# Patient Record
Sex: Female | Born: 1985 | Race: Black or African American | Hispanic: No | Marital: Married | Smoking: Former smoker
Health system: Southern US, Community
[De-identification: ages and names within clinical notes are randomized; demographics above are authoritative.]

## PROBLEM LIST (undated history)

## (undated) DIAGNOSIS — N840 Polyp of corpus uteri: Secondary | ICD-10-CM

## (undated) DIAGNOSIS — K469 Unspecified abdominal hernia without obstruction or gangrene: Secondary | ICD-10-CM

## (undated) DIAGNOSIS — I10 Essential (primary) hypertension: Secondary | ICD-10-CM

## (undated) DIAGNOSIS — K219 Gastro-esophageal reflux disease without esophagitis: Secondary | ICD-10-CM

## (undated) DIAGNOSIS — E7800 Pure hypercholesterolemia, unspecified: Secondary | ICD-10-CM

## (undated) DIAGNOSIS — E282 Polycystic ovarian syndrome: Secondary | ICD-10-CM

## (undated) DIAGNOSIS — E119 Type 2 diabetes mellitus without complications: Secondary | ICD-10-CM

## (undated) HISTORY — DX: Polyp of corpus uteri: N84.0

## (undated) HISTORY — DX: Unspecified abdominal hernia without obstruction or gangrene: K46.9

---

## 2011-11-24 HISTORY — PX: ESOPHAGOGASTRODUODENOSCOPY ENDOSCOPY: SHX5814

## 2015-07-12 ENCOUNTER — Encounter (HOSPITAL_COMMUNITY): Payer: Self-pay | Admitting: Emergency Medicine

## 2015-07-12 ENCOUNTER — Emergency Department (HOSPITAL_COMMUNITY)
Admission: EM | Admit: 2015-07-12 | Discharge: 2015-07-12 | Disposition: A | Discharge status: Home / Self Care | Payer: Self-pay | Attending: Emergency Medicine | Admitting: Emergency Medicine

## 2015-07-12 DIAGNOSIS — K0889 Other specified disorders of teeth and supporting structures: Secondary | ICD-10-CM

## 2015-07-12 DIAGNOSIS — Z8639 Personal history of other endocrine, nutritional and metabolic disease: Secondary | ICD-10-CM | POA: Insufficient documentation

## 2015-07-12 DIAGNOSIS — K088 Other specified disorders of teeth and supporting structures: Secondary | ICD-10-CM | POA: Insufficient documentation

## 2015-07-12 DIAGNOSIS — Z87891 Personal history of nicotine dependence: Secondary | ICD-10-CM | POA: Insufficient documentation

## 2015-07-12 DIAGNOSIS — I10 Essential (primary) hypertension: Secondary | ICD-10-CM | POA: Insufficient documentation

## 2015-07-12 DIAGNOSIS — K029 Dental caries, unspecified: Secondary | ICD-10-CM | POA: Insufficient documentation

## 2015-07-12 HISTORY — DX: Polycystic ovarian syndrome: E28.2

## 2015-07-12 HISTORY — DX: Pure hypercholesterolemia, unspecified: E78.00

## 2015-07-12 HISTORY — DX: Essential (primary) hypertension: I10

## 2015-07-12 HISTORY — DX: Gastro-esophageal reflux disease without esophagitis: K21.9

## 2015-07-12 MED ORDER — AMOXICILLIN 500 MG PO CAPS: 500.0000 mg | ORAL_CAPSULE | Freq: Three times a day (TID) | ORAL | Status: AC

## 2015-07-12 MED ORDER — TRAMADOL HCL 50 MG PO TABS: 50.0000 mg | ORAL_TABLET | Freq: Four times a day (QID) | ORAL | Status: DC | PRN

## 2015-07-12 NOTE — ED Provider Notes (Signed)
CSN: [id number]     Arrival date & time 07/12/15  1445 History   First MD Initiated Contact with Patient 07/12/15 1605     Chief Complaint  Patient presents with  . Dental Pain     (Consider location/radiation/quality/duration/timing/severity/associated sxs/prior Treatment) The history is provided by the patient.   Jessica Greene is a 29 y.o. female presenting with a 2 week history of dental pain with known dental cavities .   The patient has a history of injury and/or decay in the tooth involved which has recently started to cause increased  Pain. She was scheduled to have a root canal by her dentist in [place], but recently moved to this area.  There has been no fevers, chills, nausea or vomiting, also no complaint of difficulty swallowing, although chewing makes pain worse.  The patient has tried aleve without relief of symptoms.        Past Medical History  Diagnosis Date  . Hypertension   . Polycystic ovarian syndrome   . High cholesterol   . GERD (gastroesophageal reflux disease)    History reviewed. No pertinent past surgical history. History reviewed. No pertinent family history. Social History  Substance Use Topics  . Smoking status: Former Games developer  . Smokeless tobacco: None  . Alcohol Use: Yes     Comment: occasionally   OB History    No data available     Review of Systems  Constitutional: Negative for fever.  HENT: Positive for dental problem. Negative for facial swelling and sore throat.   Respiratory: Negative for shortness of breath.   Musculoskeletal: Negative for neck pain and neck stiffness.      Allergies  Review of patient's allergies indicates no known allergies.  Home Medications   Prior to Admission medications   Medication Sig Start Date End Date Taking? Authorizing Provider  naproxen sodium (ALEVE) 220 MG tablet Take 220-440 mg by mouth daily as needed (for pain).   Yes Historical Provider, MD  amoxicillin (AMOXIL) 500 MG capsule Take 1  capsule (500 mg total) by mouth 3 (three) times daily. 07/12/15 07/22/15  Burgess Amor, PA-C  traMADol (ULTRAM) 50 MG tablet Take 1 tablet (50 mg total) by mouth every 6 (six) hours as needed. 07/12/15   Burgess Amor, PA-C   BP 143/92 mmHg  Pulse 82  Temp(Src) 98.4 F (36.9 C) (Oral)  Resp 16  Ht  (1.702 m)  Wt 248 lb (112.492 kg)  BMI 38.83 kg/m2  SpO2 100% Physical Exam  Constitutional: She is oriented to person, place, and time. She appears well-developed and well-nourished. No distress.  HENT:  Head: Normocephalic and atraumatic.  Right Ear: Tympanic membrane, external ear and ear canal normal.  Left Ear: Tympanic membrane, external ear and ear canal normal.  Mouth/Throat: Oropharynx is clear and moist and mucous membranes are normal. No oral lesions. No trismus in the jaw. Dental caries present. No dental abscesses.    Sublingual space is soft.  Eyes: Conjunctivae are normal.  Neck: Normal range of motion. Neck supple.  Cardiovascular: Normal rate and normal heart sounds.   Pulmonary/Chest: Effort normal.  Abdominal: She exhibits no distension.  Musculoskeletal: Normal range of motion.  Lymphadenopathy:    She has no cervical adenopathy.  Neurological: She is alert and oriented to person, place, and time.  Skin: Skin is warm and dry. No erythema.  Psychiatric: She has a normal mood and affect.    ED Course  Procedures (including critical care time) Labs Review Labs  Reviewed - No data to display  Imaging Review No results found. I have personally reviewed and evaluated these images and lab results as part of my medical decision-making.   EKG Interpretation None      MDM   Final diagnoses:  Dental cavities  Pain, dental   Pt placed on amoxil, tramadol.  Dental referral numbers given.    Burgess Amor, PA-C 07/12/15 1627  Bethann Berkshire, MD 07/12/15 2325

## 2015-07-12 NOTE — ED Notes (Signed)
Patient complaining of right upper dental pain x 2 weeks worsening over the last two days. States pain radiates into right ear and head.

## 2015-07-12 NOTE — Discharge Instructions (Signed)
Dental Caries °Dental caries (also called tooth decay) is the most common oral disease. It can occur at any age but is more common in children and young adults.  °HOW DENTAL CARIES DEVELOPS  °The process of decay begins when bacteria and foods (particularly sugars and starches) combine in your mouth to produce plaque. Plaque is a substance that sticks to the hard, outer surface of a tooth (enamel). The bacteria in plaque produce acids that attack enamel. These acids may also attack the root surface of a tooth (cementum) if it is exposed. Repeated attacks dissolve these surfaces and create holes in the tooth (cavities). If left untreated, the acids destroy the other layers of the tooth.  °RISK FACTORS °· Frequent sipping of sugary beverages.   °· Frequent snacking on sugary and starchy foods, especially those that easily get stuck in the teeth.   °· Poor oral hygiene.   °· Dry mouth.   °· Substance abuse such as methamphetamine abuse.   °· Broken or poor-fitting dental restorations.   °· Eating disorders.   °· Gastroesophageal reflux disease (GERD).   °· Certain radiation treatments to the head and neck. °SYMPTOMS °In the early stages of dental caries, symptoms are seldom present. Sometimes white, chalky areas may be seen on the enamel or other tooth layers. In later stages, symptoms may include: °· Pits and holes on the enamel. °· Toothache after sweet, hot, or cold foods or drinks are consumed. °· Pain around the tooth. °· Swelling around the tooth. °DIAGNOSIS  °Most of the time, dental caries is detected during a regular dental checkup. A diagnosis is made after a thorough medical and dental history is taken and the surfaces of your teeth are checked for signs of dental caries. Sometimes special instruments, such as lasers, are used to check for dental caries. Dental X-ray exams may be taken so that areas not visible to the eye (such as between the contact areas of the teeth) can be checked for cavities.    °TREATMENT  °If dental caries is in its early stages, it may be reversed with a fluoride treatment or an application of a remineralizing agent at the dental office. Thorough brushing and flossing at home is needed to aid these treatments. If it is in its later stages, treatment depends on the location and extent of tooth destruction:  °· If a small area of the tooth has been destroyed, the destroyed area will be removed and cavities will be filled with a material such as gold, silver amalgam, or composite resin.   °· If a large area of the tooth has been destroyed, the destroyed area will be removed and a cap (crown) will be fitted over the remaining tooth structure.   °· If the center part of the tooth (pulp) is affected, a procedure called a root canal will be needed before a filling or crown can be placed.   °· If most of the tooth has been destroyed, the tooth may need to be pulled (extracted). °HOME CARE INSTRUCTIONS °You can prevent, stop, or reverse dental caries at home by practicing good oral hygiene. Good oral hygiene includes: °· Thoroughly cleaning your teeth at least twice a day with a toothbrush and dental floss.   °· Using a fluoride toothpaste. A fluoride mouth rinse may also be used if recommended by your dentist or health care provider.   °· Restricting the amount of sugary and starchy foods and sugary liquids you consume.   °· Avoiding frequent snacking on these foods and sipping of these liquids.   °· Keeping regular visits with   a dentist for checkups and cleanings. PREVENTION   Practice good oral hygiene.  Consider a dental sealant. A dental sealant is a coating material that is applied by your dentist to the pits and grooves of teeth. The sealant prevents food from being trapped in them. It may protect the teeth for several years.  Ask about fluoride supplements if you live in a community without fluorinated water or with water that has a low fluoride content. Use fluoride supplements  as directed by your dentist or health care provider.  Allow fluoride varnish applications to teeth if directed by your dentist or health care provider. Document Released: 08/01/2002 Document Revised: 03/26/2014 Document Reviewed: 11/11/2012 [facility] Patient Information 2015 [place], [place]. This information is not intended to replace advice given to you by your health care provider. Make sure you discuss any questions you have with your health care provider.   As discussed,  I believe you do have an early dental infection which is making your pain worse.  Use the medicines as prescribed, taking the entire course of the antibiotic.  You may take the tramadol prescribed for pain relief.  This will make you drowsy - do not drive within 4 hours of taking this medication.

## 2015-11-24 DIAGNOSIS — N840 Polyp of corpus uteri: Secondary | ICD-10-CM

## 2015-11-24 HISTORY — DX: Polyp of corpus uteri: N84.0

## 2016-06-29 HISTORY — PX: POLYPECTOMY: SHX149

## 2017-10-10 ENCOUNTER — Emergency Department (HOSPITAL_COMMUNITY)
Admission: EM | Admit: 2017-10-10 | Discharge: 2017-10-10 | Disposition: A | Discharge status: Home / Self Care | Payer: Self-pay | Attending: Emergency Medicine | Admitting: Emergency Medicine

## 2017-10-10 ENCOUNTER — Emergency Department (HOSPITAL_BASED_OUTPATIENT_CLINIC_OR_DEPARTMENT_OTHER): Admit: 2017-10-10 | Discharge: 2017-10-10 | Disposition: A | Discharge status: Home / Self Care | Payer: Self-pay

## 2017-10-10 ENCOUNTER — Encounter (HOSPITAL_COMMUNITY): Payer: Self-pay

## 2017-10-10 DIAGNOSIS — R609 Edema, unspecified: Secondary | ICD-10-CM

## 2017-10-10 DIAGNOSIS — Z87891 Personal history of nicotine dependence: Secondary | ICD-10-CM | POA: Insufficient documentation

## 2017-10-10 DIAGNOSIS — I10 Essential (primary) hypertension: Secondary | ICD-10-CM | POA: Insufficient documentation

## 2017-10-10 DIAGNOSIS — M5432 Sciatica, left side: Secondary | ICD-10-CM | POA: Insufficient documentation

## 2017-10-10 DIAGNOSIS — Z79899 Other long term (current) drug therapy: Secondary | ICD-10-CM | POA: Insufficient documentation

## 2017-10-10 DIAGNOSIS — E7800 Pure hypercholesterolemia, unspecified: Secondary | ICD-10-CM | POA: Insufficient documentation

## 2017-10-10 LAB — CBC WITH DIFFERENTIAL/PLATELET
Basophils Absolute: 0 10*3/uL (ref 0.0–0.1)
Basophils Relative: 0 %
Eosinophils Absolute: 0.2 10*3/uL (ref 0.0–0.7)
Eosinophils Relative: 3 %
HCT: 37.8 % (ref 36.0–46.0)
Hemoglobin: 12.3 g/dL (ref 12.0–15.0)
Lymphocytes Relative: 56 %
Lymphs Abs: 4 10*3/uL (ref 0.7–4.0)
MCH: 30.3 pg (ref 26.0–34.0)
MCHC: 32.5 g/dL (ref 30.0–36.0)
MCV: 93.1 fL (ref 78.0–100.0)
Monocytes Absolute: 0.4 10*3/uL (ref 0.1–1.0)
Monocytes Relative: 6 %
Neutro Abs: 2.5 10*3/uL (ref 1.7–7.7)
Neutrophils Relative %: 35 %
Platelets: 216 10*3/uL (ref 150–400)
RBC: 4.06 MIL/uL (ref 3.87–5.11)
RDW: 12.7 % (ref 11.5–15.5)
WBC: 7.1 10*3/uL (ref 4.0–10.5)

## 2017-10-10 LAB — BASIC METABOLIC PANEL
Anion gap: 7 (ref 5–15)
BUN: 7 mg/dL (ref 6–20)
CO2: 26 mmol/L (ref 22–32)
Calcium: 9.4 mg/dL (ref 8.9–10.3)
Chloride: 104 mmol/L (ref 101–111)
Creatinine, Ser: 0.61 mg/dL (ref 0.44–1.00)
GFR calc Af Amer: >60 mL/min (ref >60)
GFR calc non Af Amer: >60 mL/min (ref >60)
Glucose, Bld: 94 mg/dL (ref 65–99)
Potassium: 3.7 mmol/L (ref 3.5–5.1)
Sodium: 137 mmol/L (ref 135–145)

## 2017-10-10 LAB — I-STAT BETA HCG BLOOD, ED (MC, WL, AP ONLY): I-stat hCG, quantitative: <5 m[IU]/mL (ref <5)

## 2017-10-10 MED ORDER — PREDNISONE 10 MG (21) PO TBPK: ORAL_TABLET | Freq: Every day | ORAL | 0 refills | Status: DC

## 2017-10-10 MED ORDER — MELOXICAM 15 MG PO TABS: 15.0000 mg | ORAL_TABLET | Freq: Every day | ORAL | 0 refills | Status: DC

## 2017-10-10 NOTE — ED Notes (Signed)
Abigail, PA at bedside at this time.  

## 2017-10-10 NOTE — Progress Notes (Signed)
VASCULAR LAB PRELIMINARY  PRELIMINARY  PRELIMINARY  PRELIMINARY  Left lower extremity venous duplex completed.    Preliminary report:  There is no DVT or SVT noted in the left lower extremity.  Called report to FreerAbigail Harris, PA  [facility] Center[facility]NA[facility], [facility]ANDACE, RVT 10/10/2017, 5:21 PM

## 2017-10-10 NOTE — Discharge Instructions (Signed)
Your labs and your ultrasound were negative for any abnormalities today.  He will need to follow-up with a primary care physician for further management of your hypertension.  This is likely secondary to sciatica which is usually worsened with long periods of sitting and improves when lying flat.  We are going to treat this with a steroid taper and some anti-inflammatory medications. SEEK IMMEDIATE MEDICAL ATTENTION IF: New numbness, tingling, weakness, or problem with the use of your arms or legs.  Severe back pain not relieved with medications.  Change in bowel or bladder control.  Increasing pain in any areas of the body (such as chest or abdominal pain).  Shortness of breath, dizziness or fainting.  Nausea (feeling sick to your stomach), vomiting, fever, or sweats.

## 2017-10-10 NOTE — ED Triage Notes (Signed)
Per Pt, Pt states that she is coming from home with complaints of left leg swelling and discomfort for the last two weeks. Reports a tingling feeling like she isn't having good blood flow.

## 2017-10-10 NOTE — ED Provider Notes (Signed)
[facility] Provider Note   CSN: [id number] Arrival date & time: 10/10/17  1316     History   Chief Complaint Chief Complaint  Patient presents with  . Leg Swelling    HPI Jessica Greene is a 31 y.o. female.  Who presents with chief complaint of left leg swelling.  She has a past medical history of hypertension.  She does not take medications for.  She also has a previous history of sciatica.  Patient states for the past 2 weeks she has had what feels like swelling in her left lower extremity and thinks she might have a circulation issue because she gets some numbness and tingling in the calf and foot.  This is worsened with sitting and seems to be relieved when lying flat.  Denies chest pain, shortness of breath.  The patient has not smoked for the past 9 years.  She denies a history of blood clots personally however does have a family history of such.  She denies back pain, urinary symptoms abdominal pain, fevers or chills.  HPI  Past Medical History:  Diagnosis Date  . GERD (gastroesophageal reflux disease)   . High cholesterol   . Hypertension   . Polycystic ovarian syndrome     There are no active problems to display for this patient.   Past Surgical History:  Procedure Laterality Date  . POLYPECTOMY      OB History    No data available       Home Medications    Prior to Admission medications   Medication Sig Start Date End Date Taking? Authorizing Provider  meloxicam (MOBIC) 15 MG tablet Take 1 tablet (15 mg total) daily by mouth. 10/10/17   Montel Vanderhoof, PA-C  predniSONE (STERAPRED UNI-PAK 21 TAB) 10 MG (21) TBPK tablet Take daily by mouth. Take 6 tabs by mouth daily  for 2 days, then 5 tabs for 2 days, then 4 tabs for 2 days, then 3 tabs for 2 days, 2 tabs for 2 days, then 1 tab by mouth daily for 2 days 10/10/17   Arthor CaptainHarris, Loriene Taunton, PA-C    Family History No family history on file.  Social History Social  History   Tobacco Use  . Smoking status: Former Smoker  Substance Use Topics  . Alcohol use: Yes    Comment: occasionally  . Drug use: Yes    Types: Marijuana     Allergies   Patient has no known allergies.   Review of Systems Review of Systems Ten systems reviewed and are negative for acute change, except as noted in the HPI.    Physical Exam Updated Vital Signs BP (!) 133/91   Pulse 70   Temp 98.4 F (36.9 C) (Oral)   Resp 18   Ht 5\' 7"  (1.702 m)   Wt 115.2 kg (254 lb)   SpO2 100%   BMI 39.78 kg/m   Physical Exam  Physical Exam  Nursing note and vitals reviewed. Constitutional: She is oriented to person, place, and time. She appears well-developed and well-nourished. No distress.  HENT:  Head: Normocephalic and atraumatic.  Eyes: Conjunctivae normal and EOM are normal. Pupils are equal, round, and reactive to light. No scleral icterus.  Neck: Normal range of motion.  Cardiovascular: Normal rate, regular rhythm and normal heart sounds.  Exam reveals no gallop and no friction rub.   Bilateral DP and PT pulses 2+.  No discernible swelling in the left leg.   No murmur  heard. Pulmonary/Chest: Effort normal and breath sounds normal. No respiratory distress.  Abdominal: Soft. Bowel sounds are normal. She exhibits no distension and no mass. There is no tenderness. There is no guarding.  Musculoskeletal: Positive straight leg test on the left.Negative Homans sign, soft compartments bilaterally Neurological: She is alert and oriented to person, place, and time.  Skin: Skin is warm and dry. She is not diaphoretic.    ED Treatments / Results  Labs (all labs ordered are listed, but only abnormal results are displayed) Labs Reviewed  BASIC METABOLIC PANEL  CBC WITH DIFFERENTIAL/PLATELET  I-STAT BETA HCG BLOOD, ED ([facility], [facility], [facility] ONLY)    EKG  EKG Interpretation None       Radiology No results found.  Procedures Procedures (including critical care  time)  Medications Ordered in ED Medications - No data to display   Initial Impression / Assessment and Plan / ED Course  I have reviewed the triage vital signs and the nursing notes.  Pertinent labs & imaging results that were available during my care of the patient were reviewed by me and considered in my medical decision making (see chart for details).  Clinical Course as of Oct 10 1914  Wynelle LinkSun Oct 10, 2017  [id number] Patient ultrasound negative for DVT, her lab values are unremarkable.  [AH]    Clinical Course User Index [AH] Arthor CaptainHarris, Kassadie Pancake, PA-C   Patient with left-sided paresthesia.  No other neurologic complaints, no discernible swelling however exam is limited by the patient's body habitus.  Will obtain basic lab work and evaluate with ultrasound to rule out clot in the left lower extremity.  Final Clinical Impressions(s) / ED Diagnoses   Final diagnoses:  Sciatica, left side  Hypertension, unspecified type    ED Discharge Orders        Ordered    meloxicam (MOBIC) 15 MG tablet  Daily     10/10/17 1759    predniSONE (STERAPRED UNI-PAK 21 TAB) 10 MG (21) TBPK tablet  Daily     10/10/17 1759       Arthor CaptainHarris, Aniyha Tate, PA-C 10/10/17 1916    Arby BarrettePfeiffer, Marcy, MD 10/10/17 2017

## 2018-02-08 ENCOUNTER — Encounter (HOSPITAL_COMMUNITY): Payer: Self-pay | Admitting: *Deleted

## 2018-02-08 ENCOUNTER — Other Ambulatory Visit: Payer: Self-pay

## 2018-02-08 ENCOUNTER — Emergency Department (HOSPITAL_COMMUNITY)
Admission: EM | Admit: 2018-02-08 | Discharge: 2018-02-08 | Disposition: A | Discharge status: Home / Self Care | Payer: Self-pay | Attending: Emergency Medicine | Admitting: Emergency Medicine

## 2018-02-08 DIAGNOSIS — Z79899 Other long term (current) drug therapy: Secondary | ICD-10-CM | POA: Insufficient documentation

## 2018-02-08 DIAGNOSIS — I10 Essential (primary) hypertension: Secondary | ICD-10-CM | POA: Insufficient documentation

## 2018-02-08 DIAGNOSIS — R51 Headache: Secondary | ICD-10-CM | POA: Insufficient documentation

## 2018-02-08 DIAGNOSIS — Z87891 Personal history of nicotine dependence: Secondary | ICD-10-CM | POA: Insufficient documentation

## 2018-02-08 DIAGNOSIS — R519 Headache, unspecified: Secondary | ICD-10-CM

## 2018-02-08 LAB — I-STAT CHEM 8, ED
BUN: 4 mg/dL — ABNORMAL LOW (ref 6–20)
Calcium, Ion: 1.21 mmol/L (ref 1.15–1.40)
Chloride: 102 mmol/L (ref 101–111)
Creatinine, Ser: 0.6 mg/dL (ref 0.44–1.00)
Glucose, Bld: 96 mg/dL (ref 65–99)
HCT: 38 % (ref 36.0–46.0)
Hemoglobin: 12.9 g/dL (ref 12.0–15.0)
Potassium: 3.6 mmol/L (ref 3.5–5.1)
Sodium: 140 mmol/L (ref 135–145)
TCO2: 27 mmol/L (ref 22–32)

## 2018-02-08 MED ORDER — METOCLOPRAMIDE HCL 10 MG PO TABS
5.0000 mg | ORAL_TABLET | Freq: Once | ORAL | Status: AC
  Administered 2018-02-08: 5 mg via ORAL
  Filled 2018-02-08: qty 1

## 2018-02-08 MED ORDER — HYDROCHLOROTHIAZIDE 12.5 MG PO CAPS: 12.5000 mg | ORAL_CAPSULE | Freq: Every day | ORAL | 0 refills | Status: DC

## 2018-02-08 MED ORDER — KETOROLAC TROMETHAMINE 30 MG/ML IJ SOLN
30.0000 mg | Freq: Once | INTRAMUSCULAR | Status: AC
  Administered 2018-02-08: 30 mg via INTRAMUSCULAR
  Filled 2018-02-08: qty 1

## 2018-02-08 MED ORDER — IBUPROFEN 400 MG PO TABS
400.0000 mg | ORAL_TABLET | Freq: Once | ORAL | Status: AC | PRN
  Administered 2018-02-08: 400 mg via ORAL
  Filled 2018-02-08: qty 1

## 2018-02-08 NOTE — Discharge Instructions (Signed)
Please read attached information. If you experience any new or worsening signs or symptoms please return to the emergency room for evaluation. Please follow-up with your primary care provider or specialist as discussed. Please use medication prescribed only as directed and discontinue taking if you have any concerning signs or symptoms.   °

## 2018-02-08 NOTE — ED Provider Notes (Signed)
[facility] EMERGENCY DEPARTMENT Provider Note   CSN: [id number] Arrival date & time: 02/08/18  1715   History   Chief Complaint Chief Complaint  Patient presents with  . Headache    HPI Jessica Greene is a 32 y.o. female.    HPI   32 year old female presents today with complaints of headache.  Patient reports a significant past medical history of elevated blood pressure.  She notes that on numerous occasions she is noticed her blood pressure in the 160 range.  She was given blood pressure medication while in the hospital at one point but was not prescribed any.  She notes that last week her blood pressure was in the 160 systolic range.  She notes today she had slight blurred vision while watching the computer screen at work and generalized headache.  She denies any acute neurological deficits associated with this.  She took her blood pressure at work and noticed it was elevated.  Patient denies any acute chest pain here, abdominal pain, or any other signs of endorgan damage.  Patient was given ibuprofen in the waiting room which improved her headache.  She notes after rechecking her blood pressure was down to  153/93.      Past Medical History:  Diagnosis Date  . GERD (gastroesophageal reflux disease)   . High cholesterol   . Hypertension   . Polycystic ovarian syndrome     There are no active problems to display for this patient.   Past Surgical History:  Procedure Laterality Date  . POLYPECTOMY      OB History    No data available       Home Medications    Prior to Admission medications   Medication Sig Start Date End Date Taking? Authorizing Provider  hydrochlorothiazide (MICROZIDE) 12.5 MG capsule Take 1 capsule (12.5 mg total) by mouth daily. 02/08/18   Sarena Jezek, Tinnie Gens, PA-C  meloxicam (MOBIC) 15 MG tablet Take 1 tablet (15 mg total) daily by mouth. 10/10/17   Harris, Abigail, PA-C  predniSONE (STERAPRED UNI-PAK 21 TAB) 10 MG (21)  TBPK tablet Take daily by mouth. Take 6 tabs by mouth daily  for 2 days, then 5 tabs for 2 days, then 4 tabs for 2 days, then 3 tabs for 2 days, 2 tabs for 2 days, then 1 tab by mouth daily for 2 days 10/10/17   Arthor Captain, PA-C    Family History History reviewed. No pertinent family history.  Social History Social History   Tobacco Use  . Smoking status: Former Smoker  Substance Use Topics  . Alcohol use: Yes    Comment: occasionally  . Drug use: Yes    Types: Marijuana     Allergies   Patient has no known allergies.   Review of Systems Review of Systems  All other systems reviewed and are negative.    Physical Exam Updated Vital Signs BP (!) 153/93 (BP Location: Right Arm)   Pulse 88   Temp 98.2 F (36.8 C) (Oral)   Resp 18   SpO2 100%   Physical Exam  Constitutional: She is oriented to person, place, and time. She appears well-developed and well-nourished.  HENT:  Head: Normocephalic and atraumatic.  Eyes: Conjunctivae are normal. Pupils are equal, round, and reactive to light. Right eye exhibits no discharge. Left eye exhibits no discharge. No scleral icterus.  Neck: Normal range of motion. No JVD present. No tracheal deviation present.  Cardiovascular: Normal rate, regular rhythm, normal heart sounds and intact  distal pulses.  Pulmonary/Chest: Effort normal. No stridor.  Neurological: She is alert and oriented to person, place, and time. She has normal strength. She displays normal reflexes. No cranial nerve deficit or sensory deficit. She exhibits normal muscle tone. Coordination normal. GCS eye subscore is 4. GCS verbal subscore is 5. GCS motor subscore is 6.  Psychiatric: She has a normal mood and affect. Her behavior is normal. Judgment and thought content normal.  Nursing note and vitals reviewed.   ED Treatments / Results  Labs (all labs ordered are listed, but only abnormal results are displayed) Labs Reviewed  I-STAT CHEM 8, ED - Abnormal;  Notable for the following components:      Result Value   BUN 4 (*)    All other components within normal limits    EKG  EKG Interpretation None       Radiology No results found.  Procedures Procedures (including critical care time)  Medications Ordered in ED Medications  ibuprofen (ADVIL,MOTRIN) tablet 400 mg (400 mg Oral Given 02/08/18 1802)  ketorolac (TORADOL) 30 MG/ML injection 30 mg (30 mg Intramuscular Given 02/08/18 2043)  metoCLOPramide (REGLAN) tablet 5 mg (5 mg Oral Given 02/08/18 2042)     Initial Impression / Assessment and Plan / ED Course  I have reviewed the triage vital signs and the nursing notes.  Pertinent labs & imaging results that were available during my care of the patient were reviewed by me and considered in my medical decision making (see chart for details).     Final Clinical Impressions(s) / ED Diagnoses   Final diagnoses:  Hypertension, unspecified type  Acute nonintractable headache, unspecified headache type   Labs: I- stat chem   Imaging:  Consults:  Therapeutics: toradol, reglan  Discharge Meds: hctz  Assessment/Plan:    32 year old female presents today with hypertension and headache.  Patient does have a past medical history of hypertension but has not been started on medications.  Chem-8 reassuring with no change in kidney function no elevation in potassium or hypokalemia.  Patient started on HCTZ encouraged follow-up with primary care for reevaluation ongoing management of hypertension.  She has no signs of endorgan damage here today.  Blood pressure 153/93 with no neurological deficits.  Discharged with strict return precautions and follow-up information.  Headache resolved with above medications.  Patient verbalized understanding and agreement to today's plan.  ED Discharge Orders        Ordered    hydrochlorothiazide (MICROZIDE) 12.5 MG capsule  Daily     02/08/18 2128       Eyvonne MechanicHedges, Sehaj Mcenroe, PA-C 02/08/18 2131      Gerhard MunchLockwood, Robert, MD 02/08/18 2208

## 2018-02-08 NOTE — ED Triage Notes (Signed)
Pt reports severe headache since she woke up this am. Denies n/v. No hx of migraines. Has not taken any otc meds pta.

## 2018-02-08 NOTE — ED Notes (Signed)
ED Provider at bedside. 

## 2018-02-23 ENCOUNTER — Encounter: Payer: Self-pay | Admitting: Nurse Practitioner

## 2018-02-23 ENCOUNTER — Ambulatory Visit (INDEPENDENT_AMBULATORY_CARE_PROVIDER_SITE_OTHER): Payer: PRIVATE HEALTH INSURANCE | Admitting: Nurse Practitioner

## 2018-02-23 VITALS — BP 132/92 | HR 76 | Temp 98.1°F | Ht 67.0 in | Wt 254.4 lb

## 2018-02-23 DIAGNOSIS — R519 Headache, unspecified: Secondary | ICD-10-CM

## 2018-02-23 DIAGNOSIS — I10 Essential (primary) hypertension: Secondary | ICD-10-CM

## 2018-02-23 DIAGNOSIS — G8929 Other chronic pain: Secondary | ICD-10-CM

## 2018-02-23 DIAGNOSIS — R51 Headache: Secondary | ICD-10-CM

## 2018-02-23 DIAGNOSIS — K219 Gastro-esophageal reflux disease without esophagitis: Secondary | ICD-10-CM

## 2018-02-23 MED ORDER — NAPROXEN 500 MG PO TABS: 500.0000 mg | ORAL_TABLET | Freq: Two times a day (BID) | ORAL | 0 refills | Status: DC | PRN

## 2018-02-23 NOTE — Patient Instructions (Addendum)
Please sign medical release to get records from previous GYN.  I will provide recommendations with regard to PCOS and heavy menstrual cycle after review of records from GYN.  Continue HCTZ for HTN. Maintain DASH diet and regular exercise.  For GERD you may use Pepcid AC or omeprazole as directed on package. These are OTC medications  Make appt with ophthalmology for eye exam.  DASH Eating Plan DASH stands for "Dietary Approaches to Stop Hypertension." The DASH eating plan is a healthy eating plan that has been shown to reduce high blood pressure (hypertension). It may also reduce your risk for type 2 diabetes, heart disease, and stroke. The DASH eating plan may also help with weight loss. What are tips for following this plan? General guidelines  Avoid eating more than 2,300 mg (milligrams) of salt (sodium) a day. If you have hypertension, you may need to reduce your sodium intake to 1,500 mg a day.  Limit alcohol intake to no more than 1 drink a day for nonpregnant women and 2 drinks a day for men. One drink equals 12 oz of beer, 5 oz of wine, or 1 oz of hard liquor.  Work with your health care provider to maintain a healthy body weight or to lose weight. Ask what an ideal weight is for you.  Get at least 30 minutes of exercise that causes your heart to beat faster (aerobic exercise) most days of the week. Activities may include walking, swimming, or biking.  Work with your health care provider or diet and nutrition specialist (dietitian) to adjust your eating plan to your individual calorie needs. Reading food labels  Check food labels for the amount of sodium per serving. Choose foods with less than 5 percent of the Daily Value of sodium. Generally, foods with less than 300 mg of sodium per serving fit into this eating plan.  To find whole grains, look for the word "whole" as the first word in the ingredient list. Shopping  Buy products labeled as "low-sodium" or "no salt  added."  Buy fresh foods. Avoid canned foods and premade or frozen meals. Cooking  Avoid adding salt when cooking. Use salt-free seasonings or herbs instead of table salt or sea salt. Check with your health care provider or pharmacist before using salt substitutes.  Do not fry foods. Cook foods using healthy methods such as baking, boiling, grilling, and broiling instead.  Cook with heart-healthy oils, such as olive, canola, soybean, or sunflower oil. Meal planning   Eat a balanced diet that includes: ? 5 or more servings of fruits and vegetables each day. At each meal, try to fill half of your plate with fruits and vegetables. ? Up to 6-8 servings of whole grains each day. ? Less than 6 oz of lean meat, poultry, or fish each day. A 3-oz serving of meat is about the same size as a deck of cards. One egg equals 1 oz. ? 2 servings of low-fat dairy each day. ? A serving of nuts, seeds, or beans 5 times each week. ? Heart-healthy fats. Healthy fats called Omega-3 fatty acids are found in foods such as flaxseeds and coldwater fish, like sardines, salmon, and mackerel.  Limit how much you eat of the following: ? Canned or prepackaged foods. ? Food that is high in trans fat, such as fried foods. ? Food that is high in saturated fat, such as fatty meat. ? Sweets, desserts, sugary drinks, and other foods with added sugar. ? Full-fat dairy products.  Do  not salt foods before eating.  Try to eat at least 2 vegetarian meals each week.  Eat more home-cooked food and less restaurant, buffet, and fast food.  When eating at a restaurant, ask that your food be prepared with less salt or no salt, if possible. What foods are recommended? The items listed may not be a complete list. Talk with your dietitian about what dietary choices are best for you. Grains Whole-grain or whole-wheat bread. Whole-grain or whole-wheat pasta. Brown rice. [place]. Bulgur. Whole-grain and low-sodium cereals.  Pita bread. Low-fat, low-sodium crackers. Whole-wheat flour tortillas. Vegetables Fresh or frozen vegetables (raw, steamed, roasted, or grilled). Low-sodium or reduced-sodium tomato and vegetable juice. Low-sodium or reduced-sodium tomato sauce and tomato paste. Low-sodium or reduced-sodium canned vegetables. Fruits All fresh, dried, or frozen fruit. Canned fruit in natural juice (without added sugar). Meat and other protein foods Skinless chicken or [place]. Ground chicken or [place]. Pork with fat trimmed off. Fish and seafood. Egg whites. Dried beans, peas, or lentils. Unsalted nuts, nut butters, and seeds. Unsalted canned beans. Lean cuts of beef with fat trimmed off. Low-sodium, lean deli meat. Dairy Low-fat (1%) or fat-free (skim) milk. Fat-free, low-fat, or reduced-fat cheeses. Nonfat, low-sodium ricotta or cottage cheese. Low-fat or nonfat yogurt. Low-fat, low-sodium cheese. Fats and oils Soft margarine without trans fats. Vegetable oil. Low-fat, reduced-fat, or light mayonnaise and salad dressings (reduced-sodium). Canola, safflower, olive, soybean, and sunflower oils. Avocado. Seasoning and other foods Herbs. Spices. Seasoning mixes without salt. Unsalted popcorn and pretzels. Fat-free sweets. What foods are not recommended? The items listed may not be a complete list. Talk with your dietitian about what dietary choices are best for you. Grains Baked goods made with fat, such as croissants, muffins, or some breads. Dry pasta or rice meal packs. Vegetables Creamed or fried vegetables. Vegetables in a cheese sauce. Regular canned vegetables (not low-sodium or reduced-sodium). Regular canned tomato sauce and paste (not low-sodium or reduced-sodium). Regular tomato and vegetable juice (not low-sodium or reduced-sodium). Angie Fava. Olives. Fruits Canned fruit in a light or heavy syrup. Fried fruit. Fruit in cream or butter sauce. Meat and other protein foods Fatty cuts of meat. Ribs. Fried  meat. Berniece Salines. Sausage. Bologna and other processed lunch meats. Salami. Fatback. Hotdogs. Bratwurst. Salted nuts and seeds. Canned beans with added salt. Canned or smoked fish. Whole eggs or egg yolks. Chicken or [place] with skin. Dairy Whole or 2% milk, cream, and half-and-half. Whole or full-fat cream cheese. Whole-fat or sweetened yogurt. Full-fat cheese. Nondairy creamers. Whipped toppings. Processed cheese and cheese spreads. Fats and oils Butter. Stick margarine. Lard. Shortening. Ghee. Bacon fat. Tropical oils, such as coconut, palm kernel, or palm oil. Seasoning and other foods Salted popcorn and pretzels. Onion salt, garlic salt, seasoned salt, table salt, and sea salt. Worcestershire sauce. Tartar sauce. Barbecue sauce. Teriyaki sauce. Soy sauce, including reduced-sodium. Steak sauce. Canned and packaged gravies. Fish sauce. Oyster sauce. Cocktail sauce. Horseradish that you find on the shelf. Ketchup. Mustard. Meat flavorings and tenderizers. Bouillon cubes. Hot sauce and Tabasco sauce. Premade or packaged marinades. Premade or packaged taco seasonings. Relishes. Regular salad dressings. Where to find more information:  National Heart, Lung, and [place]: https://wilson-eaton.com/  American Heart Association: www.heart.org Summary  The DASH eating plan is a healthy eating plan that has been shown to reduce high blood pressure (hypertension). It may also reduce your risk for type 2 diabetes, heart disease, and stroke.  With the DASH eating plan, you should limit salt (sodium) intake  to 2,300 mg a day. If you have hypertension, you may need to reduce your sodium intake to 1,500 mg a day.  When on the DASH eating plan, aim to eat more fresh fruits and vegetables, whole grains, lean proteins, low-fat dairy, and heart-healthy fats.  Work with your health care provider or diet and nutrition specialist (dietitian) to adjust your eating plan to your individual calorie needs. This information is  not intended to replace advice given to you by your health care provider. Make sure you discuss any questions you have with your health care provider. Document Released: 10/29/2011 Document Revised: 11/02/2016 Document Reviewed: 11/02/2016 Elsevier Interactive Patient Education  [employer]2018 Elsevier Inc.

## 2018-02-23 NOTE — Progress Notes (Signed)
Subjective:  Patient ID: Jessica Greene, female    DOB: 1985-12-16  Age: 32 y.o. MRN: [id number]  CC: Establish Care ( Est care.would like to discuss issues with bleeding (PCOS), recently in hospital for headache/ vision issues on 3.19.19  Tdap?)   HPI  Hx of PCOS: She is not interested in use of OCP or metformin at this time. Metformin 500mg  use in past, stopped due to diarrhea.  Hx of menorrhagia: Reports chronic intermittent vaginal bleed with clots and tissue. Had uterine polypectomy is past by previous GYN.  Hx of chronic Headache: Used of naproxen and ibuprofen as needed with significant improvement. Described as pressure. Onset 2011. Associated with blurry vision.  no eye exam in several years. Use of corrective lens.  Hx of GERD. Like like to know what to use to improve heartburn  Outpatient Medications Prior to Visit  Medication Sig Dispense Refill  . hydrochlorothiazide (MICROZIDE) 12.5 MG capsule Take 1 capsule (12.5 mg total) by mouth daily. 30 capsule 0  . meloxicam (MOBIC) 15 MG tablet Take 1 tablet (15 mg total) daily by mouth. (Patient not taking: Reported on 02/23/2018) 30 tablet 0  . predniSONE (STERAPRED UNI-PAK 21 TAB) 10 MG (21) TBPK tablet Take daily by mouth. Take 6 tabs by mouth daily  for 2 days, then 5 tabs for 2 days, then 4 tabs for 2 days, then 3 tabs for 2 days, 2 tabs for 2 days, then 1 tab by mouth daily for 2 days (Patient not taking: Reported on 02/23/2018) 42 tablet 0   No facility-administered medications prior to visit.    Social History   Socioeconomic History  . Marital status: Married    Spouse name: Not on file  . Number of children: Not on file  . Years of education: Not on file  . Highest education level: Not on file  Occupational History  . Not on file  Social Needs  . Financial resource strain: Not on file  . Food insecurity:    Worry: Not on file    Inability: Not on file  . Transportation needs:    Medical: Not on  file    Non-medical: Not on file  Tobacco Use  . Smoking status: Former Games developer  . Smokeless tobacco: Never Used  Substance and Sexual Activity  . Alcohol use: Yes    Comment: occasionally  . Drug use: Yes    Types: Marijuana  . Sexual activity: Not on file  Lifestyle  . Physical activity:    Days per week: Not on file    Minutes per session: Not on file  . Stress: Not on file  Relationships  . Social connections:    Talks on phone: Not on file    Gets together: Not on file    Attends religious service: Not on file    Active member of club or organization: Not on file    Attends meetings of clubs or organizations: Not on file    Relationship status: Not on file  . Intimate partner violence:    Fear of current or ex partner: Not on file    Emotionally abused: Not on file    Physically abused: Not on file    Forced sexual activity: Not on file  Other Topics Concern  . Not on file  Social History Narrative  . Not on file   Family History  Problem Relation Age of Onset  . Diabetes Mother   . Hypertension Mother   . Hyperlipidemia Mother   .  Heart disease Mother   . Hypertension Father   . Diabetes Father   . Hyperlipidemia Father   . Heart disease Father   . Stroke Father   . Cancer Father        pancreatic  . Heart disease Sister   . Depression Sister   . Bipolar disorder Sister   . Heart disease Maternal Aunt   . Heart disease Maternal Uncle   . Heart disease Paternal Aunt   . Heart disease Paternal Uncle   . Heart disease Maternal Grandmother   . Heart disease Maternal Grandfather   . Heart disease Paternal Grandmother   . Heart disease Paternal Grandfather   . Birth defects Paternal Grandfather     ROS Review of Systems  HENT: Negative for sinus pain.   Eyes: Positive for blurred vision. Negative for photophobia and redness.  Respiratory: Negative.   Cardiovascular: Negative.   Gastrointestinal: Negative for abdominal pain.  Neurological: Positive  for headaches. Negative for dizziness and sensory change.  Psychiatric/Behavioral: Negative.     Objective:  BP (!) 132/92 (BP Location: Left Arm, Patient Position: Sitting, Cuff Size: Large)   Pulse 76   Temp 98.1 F (36.7 C) (Oral)   Ht 5\' 7"  (1.702 m)   Wt 254 lb 6.4 oz (115.4 kg)   SpO2 94%   BMI 39.84 kg/m   BP Readings from Last 3 Encounters:  02/23/18 (!) 132/92  02/08/18 (!) 144/95  10/10/17 118/79    Wt Readings from Last 3 Encounters:  02/23/18 254 lb 6.4 oz (115.4 kg)  10/10/17 254 lb (115.2 kg)  07/12/15 248 lb (112.5 kg)    Physical Exam  Constitutional: She is oriented to person, place, and time. No distress.  Cardiovascular: Normal rate, regular rhythm and normal heart sounds.  Pulmonary/Chest: Effort normal and breath sounds normal.  Abdominal: Soft. She exhibits no distension.  Musculoskeletal: Normal range of motion. She exhibits no edema.  Neurological: She is alert and oriented to person, place, and time.  Skin: No rash noted.  Psychiatric: She has a normal mood and affect. Her behavior is normal.  Vitals reviewed.   Lab Results  Component Value Date   WBC 7.1 10/10/2017   HGB 12.9 02/08/2018   HCT 38.0 02/08/2018   PLT 216 10/10/2017   GLUCOSE 96 02/08/2018   NA 140 02/08/2018   K 3.6 02/08/2018   CL 102 02/08/2018   CREATININE 0.60 02/08/2018   BUN 4 (L) 02/08/2018   CO2 26 10/10/2017    Assessment & Plan:   Jessica Greene was seen today for establish care.  Diagnoses and all orders for this visit:  Chronic intractable headache, unspecified headache type -     naproxen (NAPROSYN) 500 MG tablet; Take 1 tablet (500 mg total) by mouth 2 (two) times daily between meals as needed. For headache  Essential hypertension  Gastroesophageal reflux disease without esophagitis   I am having Jessica Greene start on naproxen. I am also having her maintain her meloxicam, predniSONE, and hydrochlorothiazide.  Meds ordered this encounter    Medications  . naproxen (NAPROSYN) 500 MG tablet    Sig: Take 1 tablet (500 mg total) by mouth 2 (two) times daily between meals as needed. For headache    Dispense:  30 tablet    Refill:  0    Order Specific Question:   Supervising Provider    Answer:   Dianne Dun [[id number]]    Follow-up: Return in about 1 month (around 03/23/2018) for  HTN and headache.  Alysia Pennaharlotte Nche, NP

## 2018-02-28 ENCOUNTER — Encounter: Payer: Self-pay | Admitting: Nurse Practitioner

## 2018-02-28 ENCOUNTER — Telehealth: Payer: Self-pay | Admitting: Nurse Practitioner

## 2018-02-28 DIAGNOSIS — R739 Hyperglycemia, unspecified: Secondary | ICD-10-CM | POA: Insufficient documentation

## 2018-02-28 DIAGNOSIS — N921 Excessive and frequent menstruation with irregular cycle: Secondary | ICD-10-CM

## 2018-02-28 DIAGNOSIS — R7303 Prediabetes: Secondary | ICD-10-CM | POA: Insufficient documentation

## 2018-02-28 DIAGNOSIS — K219 Gastro-esophageal reflux disease without esophagitis: Secondary | ICD-10-CM | POA: Insufficient documentation

## 2018-02-28 DIAGNOSIS — N840 Polyp of corpus uteri: Secondary | ICD-10-CM | POA: Insufficient documentation

## 2018-02-28 DIAGNOSIS — I10 Essential (primary) hypertension: Secondary | ICD-10-CM | POA: Insufficient documentation

## 2018-02-28 NOTE — Telephone Encounter (Signed)
Left voicemail for patient to call office to setup appointment for GYN doctor.

## 2018-02-28 NOTE — Telephone Encounter (Signed)
Please inform patient that I received records from previous GYN with [facility]. I will recommend she f/up with GYN due to hx of uterine polyp and irregular menstrual cycle. Referral entered.

## 2018-02-28 NOTE — Telephone Encounter (Signed)
Left detailed message on VM. TLG

## 2018-03-21 ENCOUNTER — Encounter: Payer: Self-pay | Admitting: Obstetrics and Gynecology

## 2018-03-21 ENCOUNTER — Ambulatory Visit (INDEPENDENT_AMBULATORY_CARE_PROVIDER_SITE_OTHER): Payer: PRIVATE HEALTH INSURANCE | Admitting: Obstetrics and Gynecology

## 2018-03-21 VITALS — BP 129/85 | HR 89 | Ht 67.0 in | Wt 260.2 lb

## 2018-03-21 DIAGNOSIS — Z01419 Encounter for gynecological examination (general) (routine) without abnormal findings: Secondary | ICD-10-CM | POA: Diagnosis not present

## 2018-03-21 DIAGNOSIS — Z124 Encounter for screening for malignant neoplasm of cervix: Secondary | ICD-10-CM

## 2018-03-21 DIAGNOSIS — N938 Other specified abnormal uterine and vaginal bleeding: Secondary | ICD-10-CM

## 2018-03-21 DIAGNOSIS — Z1151 Encounter for screening for human papillomavirus (HPV): Secondary | ICD-10-CM

## 2018-03-21 DIAGNOSIS — Z113 Encounter for screening for infections with a predominantly sexual mode of transmission: Secondary | ICD-10-CM | POA: Diagnosis not present

## 2018-03-21 MED ORDER — NORETHINDRONE 0.35 MG PO TABS: 1.0000 | ORAL_TABLET | Freq: Every day | ORAL | 11 refills | Status: DC

## 2018-03-21 NOTE — Patient Instructions (Signed)
Diet for Polycystic Ovarian Syndrome Polycystic ovary syndrome (PCOS) is a disorder of the chemical messengers (hormones) that regulate menstruation. The condition causes important hormones to be out of balance. PCOS can:  Make your periods irregular or stop.  Cause cysts to develop on the ovaries.  Make it difficult to get pregnant.  Stop your body from responding to the effects of insulin (insulin resistance), which can lead to obesity and diabetes.  Changing what you eat can help manage PCOS and improve your health. It can help you lose weight and improve the way your body uses insulin. What is my plan?  Eat breakfast, lunch, and dinner plus two snacks every day.  Include protein in each meal and snack.  Choose whole grains instead of products made with refined flour.  Eat a variety of foods.  Exercise regularly as told by your health care provider. What do I need to know about this eating plan? If you are overweight or obese, pay attention to how many calories you eat. Cutting down on calories can help you lose weight. Work with your health care provider or dietitian to figure out how many calories you need each day. What foods can I eat? Grains Whole grains, such as whole wheat. Whole-grain breads, crackers, cereals, and pasta. Unsweetened oatmeal, bulgur, barley, quinoa, or brown rice. Corn or whole-wheat flour tortillas. Vegetables  Lettuce. Spinach. Peas. Beets. Cauliflower. Cabbage. Broccoli. Carrots. Tomatoes. Squash. Eggplant. Herbs. Peppers. Onions. Cucumbers. Brussels sprouts. Fruits Berries. Bananas. Apples. Oranges. Grapes. Papaya. Mango. Pomegranate. Kiwi. Grapefruit. Cherries. Meats and Other Protein Sources Lean proteins, such as fish, chicken, beans, eggs, and tofu. Dairy Low-fat dairy products, such as skim milk, cheese sticks, and yogurt. Beverages Low-fat or fat-free drinks, such as water, low-fat milk, sugar-free drinks, and 100% fruit  juice. Condiments Ketchup. Mustard. Barbecue sauce. Relish. Low-fat or fat-free mayonnaise. Fats and Oils Olive oil or canola oil. Walnuts and almonds. The items listed above may not be a complete list of recommended foods or beverages. Contact your dietitian for more options. What foods are not recommended? Foods high in calories or fat. Fried foods. Sweets. Products made from refined white flour, including white bread, pastries, white rice, and pasta. The items listed above may not be a complete list of foods and beverages to avoid. Contact your dietitian for more information. This information is not intended to replace advice given to you by your health care provider. Make sure you discuss any questions you have with your health care provider. Document Released: 03/02/2016 Document Revised: 04/16/2016 Document Reviewed: 11/21/2014 Elsevier Interactive Patient Education  2018 Elsevier Inc.  

## 2018-03-21 NOTE — Progress Notes (Signed)
Subjective:     Jessica Greene is a 32 y.o. female G1P0010 with BMI 40 and DUB who is here for a comprehensive physical exam. The patient reports persistent vaginal bleeding. She reports a history of irregular menses for most of her reproductive years. She had a d&c for the removal of a uterine polyp and reports amenorrhea for approximately a year. Since her D&C, she has experienced daily vaginal bleeding alternating between heavy and light days. She desires to conceive but has not been successful. Her partner has children. She conceive a child at the age of 71 but nothing then. She reports being diagnosed with PCOS and started on metformin but was not able to tolerate the GI effects  Past Medical History:  Diagnosis Date  . GERD (gastroesophageal reflux disease)   . Hernia of abdominal cavity   . High cholesterol   . Hypertension   . Polycystic ovarian syndrome   . Uterine polyp 2017   Past Surgical History:  Procedure Laterality Date  . ESOPHAGOGASTRODUODENOSCOPY ENDOSCOPY  2013  . POLYPECTOMY  06/29/2016   s/p hysteroscopic polypectomy    Family History  Problem Relation Age of Onset  . Diabetes Mother   . Hypertension Mother   . Hyperlipidemia Mother   . Heart disease Mother   . Hypertension Father   . Diabetes Father   . Hyperlipidemia Father   . Heart disease Father   . Stroke Father   . Cancer Father        pancreatic  . Heart disease Sister   . Depression Sister   . Bipolar disorder Sister   . Heart disease Maternal Aunt   . Heart disease Maternal Uncle   . Heart disease Paternal Aunt   . Heart disease Paternal Uncle   . Heart disease Maternal Grandmother   . Heart disease Maternal Grandfather   . Heart disease Paternal Grandmother   . Heart disease Paternal Grandfather   . Birth defects Paternal Grandfather      Social History   Socioeconomic History  . Marital status: Married    Spouse name: Not on file  . Number of children: Not on file  . Years  of education: Not on file  . Highest education level: Not on file  Occupational History  . Not on file  Social Needs  . Financial resource strain: Not on file  . Food insecurity:    Worry: Not on file    Inability: Not on file  . Transportation needs:    Medical: Not on file    Non-medical: Not on file  Tobacco Use  . Smoking status: Former Games developer  . Smokeless tobacco: Never Used  Substance and Sexual Activity  . Alcohol use: Yes    Comment: occasionally  . Drug use: Yes    Types: Marijuana  . Sexual activity: Yes    Partners: Male    Birth control/protection: None  Lifestyle  . Physical activity:    Days per week: Not on file    Minutes per session: Not on file  . Stress: Not on file  Relationships  . Social connections:    Talks on phone: Not on file    Gets together: Not on file    Attends religious service: Not on file    Active member of club or organization: Not on file    Attends meetings of clubs or organizations: Not on file    Relationship status: Not on file  . Intimate partner violence:    Fear  of current or ex partner: Not on file    Emotionally abused: Not on file    Physically abused: Not on file    Forced sexual activity: Not on file  Other Topics Concern  . Not on file  Social History Narrative  . Not on file   Health Maintenance  Topic Date Due  . HIV Screening  05/26/2000  . TETANUS/TDAP  05/26/2004  . PAP SMEAR  05/26/2006  . INFLUENZA VACCINE  06/23/2018       Review of Systems Pertinent items are noted in HPI.   Objective:  Blood pressure 155/95 (repeat 129/85), pulse 89, height  (1.702 m), weight 260 lb 3.2 oz (118 kg).     GENERAL: Well-developed, well-nourished female in no acute distress.  HEENT: Normocephalic, atraumatic. Sclerae anicteric.  NECK: Supple. Normal thyroid.  LUNGS: Clear to auscultation bilaterally.  HEART: Regular rate and rhythm. BREASTS: Symmetric in size. No palpable masses or lymphadenopathy, skin  changes, or nipple drainage. ABDOMEN: Soft, nontender, nondistended. No organomegaly. PELVIC: Normal external female genitalia. Vagina is pink and rugated.  Normal discharge. Normal appearing cervix. Bimanual exam limited by body habitus. EXTREMITIES: No cyanosis, clubbing, or edema, 2+ distal pulses.    Assessment:    Healthy female exam.      Plan:    pap smear collected STI screen performed per patient request TSH ordered Pelvic ultrasound ordered Rx micronor provided Patient will be contacted with results If everything is normal, patient will return at her convenience for a trial of provera/femara See After Visit Summary for Counseling Recommendations

## 2018-03-21 NOTE — Progress Notes (Signed)
Pt presents for annual, pap, and all STD blood test. Pt c/o vaginal bleeding everyday. She has been diagnoses with PCOS and has hx of polyps per pt. Pt c/o HA's - requests refill on BP meds. Has an appt with PCP on Weds.

## 2018-03-22 LAB — CYTOLOGY - PAP
Diagnosis: NEGATIVE
HPV: NOT DETECTED

## 2018-03-22 LAB — HEPATITIS B SURFACE ANTIGEN: Hepatitis B Surface Ag: NEGATIVE

## 2018-03-22 LAB — HEPATITIS C ANTIBODY: Hep C Virus Ab: <0.1 s/co ratio (ref 0.0–0.9)

## 2018-03-22 LAB — RPR: RPR Ser Ql: NONREACTIVE

## 2018-03-22 LAB — GC/CHLAMYDIA PROBE AMP (~~LOC~~) NOT AT ARMC
Chlamydia: NEGATIVE
Neisseria Gonorrhea: NEGATIVE

## 2018-03-22 LAB — HIV ANTIBODY (ROUTINE TESTING W REFLEX): HIV Screen 4th Generation wRfx: NONREACTIVE

## 2018-03-22 LAB — TSH: TSH: 3.16 u[IU]/mL (ref 0.450–4.500)

## 2018-03-25 ENCOUNTER — Other Ambulatory Visit: Payer: PRIVATE HEALTH INSURANCE

## 2018-03-25 ENCOUNTER — Encounter: Payer: Self-pay | Admitting: *Deleted

## 2018-03-25 ENCOUNTER — Ambulatory Visit: Payer: PRIVATE HEALTH INSURANCE | Admitting: Nurse Practitioner

## 2018-04-01 ENCOUNTER — Ambulatory Visit
Admission: RE | Admit: 2018-04-01 | Discharge: 2018-04-01 | Disposition: A | Discharge status: Home / Self Care | Payer: PRIVATE HEALTH INSURANCE | Source: Ambulatory Visit | Attending: Obstetrics and Gynecology | Admitting: Obstetrics and Gynecology

## 2018-04-01 DIAGNOSIS — N938 Other specified abnormal uterine and vaginal bleeding: Secondary | ICD-10-CM

## 2018-04-13 ENCOUNTER — Encounter: Payer: Self-pay | Admitting: Nurse Practitioner

## 2018-04-13 ENCOUNTER — Ambulatory Visit (INDEPENDENT_AMBULATORY_CARE_PROVIDER_SITE_OTHER): Payer: PRIVATE HEALTH INSURANCE | Admitting: Nurse Practitioner

## 2018-04-13 VITALS — BP 124/80 | HR 88 | Temp 98.2°F | Ht 67.0 in | Wt 257.0 lb

## 2018-04-13 DIAGNOSIS — Z13220 Encounter for screening for lipoid disorders: Secondary | ICD-10-CM | POA: Diagnosis not present

## 2018-04-13 DIAGNOSIS — I10 Essential (primary) hypertension: Secondary | ICD-10-CM

## 2018-04-13 DIAGNOSIS — R739 Hyperglycemia, unspecified: Secondary | ICD-10-CM

## 2018-04-13 DIAGNOSIS — Z0001 Encounter for general adult medical examination with abnormal findings: Secondary | ICD-10-CM

## 2018-04-13 DIAGNOSIS — F4323 Adjustment disorder with mixed anxiety and depressed mood: Secondary | ICD-10-CM

## 2018-04-13 DIAGNOSIS — Z23 Encounter for immunization: Secondary | ICD-10-CM

## 2018-04-13 DIAGNOSIS — Z0000 Encounter for general adult medical examination without abnormal findings: Secondary | ICD-10-CM | POA: Diagnosis not present

## 2018-04-13 DIAGNOSIS — Z136 Encounter for screening for cardiovascular disorders: Secondary | ICD-10-CM | POA: Diagnosis not present

## 2018-04-13 LAB — CBC
HCT: 37.7 % (ref 36.0–46.0)
Hemoglobin: 12.4 g/dL (ref 12.0–15.0)
MCHC: 32.9 g/dL (ref 30.0–36.0)
MCV: 93.1 fl (ref 78.0–100.0)
Platelets: 236 10*3/uL (ref 150.0–400.0)
RBC: 4.05 Mil/uL (ref 3.87–5.11)
RDW: 13.4 % (ref 11.5–15.5)
WBC: 6 10*3/uL (ref 4.0–10.5)

## 2018-04-13 LAB — COMPREHENSIVE METABOLIC PANEL
ALT: 15 U/L (ref 0–35)
AST: 17 U/L (ref 0–37)
Albumin: 4 g/dL (ref 3.5–5.2)
Alkaline Phosphatase: 63 U/L (ref 39–117)
BUN: 6 mg/dL (ref 6–23)
CO2: 28 mEq/L (ref 19–32)
Calcium: 9.2 mg/dL (ref 8.4–10.5)
Chloride: 103 mEq/L (ref 96–112)
Creatinine, Ser: 0.57 mg/dL (ref 0.40–1.20)
GFR: 157.21 mL/min (ref >60.00)
Glucose, Bld: 99 mg/dL (ref 70–99)
Potassium: 3.7 mEq/L (ref 3.5–5.1)
Sodium: 137 mEq/L (ref 135–145)
Total Bilirubin: 0.4 mg/dL (ref 0.2–1.2)
Total Protein: 7 g/dL (ref 6.0–8.3)

## 2018-04-13 LAB — LIPID PANEL
Cholesterol: 202 mg/dL — ABNORMAL HIGH (ref 0–200)
HDL: 28.4 mg/dL — ABNORMAL LOW (ref >39.00)
LDL Cholesterol: 138 mg/dL — ABNORMAL HIGH (ref 0–99)
NonHDL: 173.2
Total CHOL/HDL Ratio: 7
Triglycerides: 176 mg/dL — ABNORMAL HIGH (ref 0.0–149.0)
VLDL: 35.2 mg/dL (ref 0.0–40.0)

## 2018-04-13 LAB — HEMOGLOBIN A1C: Hgb A1c MFr Bld: 5.9 % (ref 4.6–6.5)

## 2018-04-13 MED ORDER — HYDROCHLOROTHIAZIDE 12.5 MG PO CAPS
12.5000 mg | ORAL_CAPSULE | Freq: Every day | ORAL | 1 refills | Status: DC
Start: 2018-04-13 — End: 2021-04-30

## 2018-04-13 MED ORDER — HYDROCHLOROTHIAZIDE 12.5 MG PO CAPS: 12.5000 mg | ORAL_CAPSULE | Freq: Every day | ORAL | 1 refills | Status: DC

## 2018-04-13 NOTE — Progress Notes (Signed)
Subjective:    Patient ID: Jessica Greene, female    DOB: [birth year], 32 y.o.   MRN: [id number]  Patient presents today for complete physical  HPI  Headache: Chronic Mild, use of ibuprofen prn.  HTN: Out of medication for 2weeks. Reports lack of motivation to exercise or follow a heart healthy diet. BP Readings from Last 3 Encounters:  04/13/18 124/80  03/21/18 129/85  02/23/18 (!) 132/92   Immunizations: (TDAP, Hep C screen, Pneumovax, Influenza, zoster)  Health Maintenance  Topic Date Due  . Flu Shot  06/23/2018  . Pap Smear  03/21/2021  . Tetanus Vaccine  04/13/2028  . HIV Screening  Completed   Diet:regular.  Weight:  Wt Readings from Last 3 Encounters:  04/13/18 257 lb (116.6 kg)  03/21/18 260 lb 3.2 oz (118 kg)  02/23/18 254 lb 6.4 oz (115.4 kg)    Exercise:none.  Fall Risk: Fall Risk  04/13/2018 02/23/2018  Falls in the past year? No No   Home Safety:home with husband and adopted daughter.  Depression/Suicide: will like referral to psychologist, does not want any medication at this time. Depression screen [facility] 2/9 04/13/2018 02/23/2018  Decreased Interest 2 2  Down, Depressed, Hopeless 2 2  PHQ - 2 Score 4 4  Altered sleeping 0 -  Tired, decreased energy 2 -  Change in appetite 2 -  Feeling bad or failure about yourself  2 -  Trouble concentrating 2 -  Moving slowly or fidgety/restless 2 -  Suicidal thoughts 0 -  PHQ-9 Score 14 -  Difficult doing work/chores Somewhat difficult -   Vision:will schedule  Dental:will schedule  Advanced Directive: Advanced Directives 10/10/2017  Does Patient Have a Medical Advance Directive? No  Would patient like information on creating a medical advance directive? -   Medications and allergies reviewed with patient and updated if appropriate.  Patient Active Problem List   Diagnosis Date Noted  . Mood disorder (HCC) 04/14/2018  . Uterine polyp 02/28/2018  . Hyperglycemia 02/28/2018  . HTN  (hypertension) 02/28/2018  . GERD (gastroesophageal reflux disease) 02/28/2018    Current Outpatient Medications on File Prior to Visit  Medication Sig Dispense Refill  . norethindrone (MICRONOR,CAMILA,ERRIN) 0.35 MG tablet Take 1 tablet (0.35 mg total) by mouth daily. 1 Package 11   No current facility-administered medications on file prior to visit.     Past Medical History:  Diagnosis Date  . GERD (gastroesophageal reflux disease)   . Hernia of abdominal cavity   . High cholesterol   . Hypertension   . Polycystic ovarian syndrome   . Uterine polyp 2017    Past Surgical History:  Procedure Laterality Date  . ESOPHAGOGASTRODUODENOSCOPY ENDOSCOPY  2013  . POLYPECTOMY  06/29/2016   s/p hysteroscopic polypectomy     Social History   Socioeconomic History  . Marital status: Married    Spouse name: Not on file  . Number of children: Not on file  . Years of education: Not on file  . Highest education level: Not on file  Occupational History  . Not on file  Social Needs  . Financial resource strain: Not on file  . Food insecurity:    Worry: Not on file    Inability: Not on file  . Transportation needs:    Medical: Not on file    Non-medical: Not on file  Tobacco Use  . Smoking status: Former Games developer  . Smokeless tobacco: Never Used  Substance and Sexual Activity  . Alcohol use: Yes  Comment: occasionally  . Drug use: Yes    Frequency: 1.0 times per week    Types: Marijuana    Comment: use of extasy in past, last used 2009  . Sexual activity: Yes    Partners: Male    Birth control/protection: None  Lifestyle  . Physical activity:    Days per week: Not on file    Minutes per session: Not on file  . Stress: Not on file  Relationships  . Social connections:    Talks on phone: Not on file    Gets together: Not on file    Attends religious service: Not on file    Active member of club or organization: Not on file    Attends meetings of clubs or  organizations: Not on file    Relationship status: Not on file  Other Topics Concern  . Not on file  Social History Narrative  . Not on file    Family History  Problem Relation Age of Onset  . Diabetes Mother   . Hypertension Mother   . Hyperlipidemia Mother   . Heart disease Mother   . Hypertension Father   . Diabetes Father   . Hyperlipidemia Father   . Heart disease Father   . Stroke Father   . Cancer Father        pancreatic  . Heart disease Sister   . Depression Sister   . Bipolar disorder Sister   . Heart disease Maternal Aunt   . Heart disease Maternal Uncle   . Heart disease Paternal Aunt   . Heart disease Paternal Uncle   . Heart disease Maternal Grandmother   . Heart disease Maternal Grandfather   . Heart disease Paternal Grandmother   . Heart disease Paternal Grandfather   . Birth defects Paternal Grandfather        Review of Systems  Constitutional: Negative for fever, malaise/fatigue and weight loss.  HENT: Negative for congestion and sore throat.   Eyes:       Negative for visual changes  Respiratory: Negative for cough and shortness of breath.   Cardiovascular: Negative for chest pain, palpitations and leg swelling.  Gastrointestinal: Negative for blood in stool, constipation, diarrhea and heartburn.  Genitourinary: Negative for dysuria, frequency and urgency.  Musculoskeletal: Negative for falls, joint pain and myalgias.  Skin: Negative for rash.  Neurological: Negative for dizziness, sensory change and headaches.  Endo/Heme/Allergies: Does not bruise/bleed easily.  Psychiatric/Behavioral: Positive for depression. Negative for substance abuse and suicidal ideas. The patient is nervous/anxious. The patient does not have insomnia.     Objective:   Vitals:   04/13/18 1016  BP: 124/80  Pulse: 88  Temp: 98.2 F (36.8 C)  SpO2: 98%    Body mass index is 40.25 kg/m.   Physical Examination:  Physical Exam  Constitutional: She is oriented  to person, place, and time. She appears well-developed. No distress.  HENT:  Right Ear: External ear normal.  Left Ear: External ear normal.  Nose: Nose normal.  Mouth/Throat: Oropharynx is clear and moist. No oropharyngeal exudate.  Eyes: Pupils are equal, round, and reactive to light. Conjunctivae and EOM are normal.  Neck: Normal range of motion. Neck supple. No thyromegaly present.  Cardiovascular: Normal rate, regular rhythm, normal heart sounds and intact distal pulses.  Pulmonary/Chest: Effort normal and breath sounds normal. No respiratory distress. She exhibits no tenderness.  Abdominal: Soft. Bowel sounds are normal. She exhibits no distension. There is no tenderness.  Genitourinary:  Genitourinary  Comments: Breast and pelvic exam deferred to GYN  Musculoskeletal: Normal range of motion. She exhibits no edema.  Lymphadenopathy:    She has no cervical adenopathy.  Neurological: She is alert and oriented to person, place, and time. She has normal reflexes.  Skin: Skin is warm and dry. No rash noted. No erythema.  Psychiatric: Her behavior is normal. Thought content normal.  Vitals reviewed.   ASSESSMENT and PLAN:  Magin was seen today for annual exam.  Diagnoses and all orders for this visit:  Encounter for preventative adult health care exam with abnormal findings -     CBC -     Cancel: TSH -     Comprehensive metabolic panel  Essential hypertension -     Discontinue: hydrochlorothiazide (MICROZIDE) 12.5 MG capsule; Take 1 capsule (12.5 mg total) by mouth daily. -     hydrochlorothiazide (MICROZIDE) 12.5 MG capsule; Take 1 capsule (12.5 mg total) by mouth daily.  Hyperglycemia -     Hemoglobin A1c  Adjustment disorder with mixed anxiety and depressed mood -     Ambulatory referral to Psychology  Encounter for lipid screening for cardiovascular disease -     Lipid panel  Need for diphtheria-tetanus-pertussis (Tdap) vaccine -     Tdap vaccine greater than or  equal to 7yo IM   No problem-specific Assessment & Plan notes found for this encounter.     Follow up: Return in about 3 months (around 07/14/2018) for HTN and depression (need GAD and PHQ9 screen).  Alysia Penna, NP

## 2018-04-13 NOTE — Patient Instructions (Addendum)
Normal CMP, cbc. HgbA1c indicates prediabetes. Abnormal Lipid panel. It is important to maintain DASH diet and regular exercise to improve lipid panel and glucose level. F/up in 1month (fasting)  Encourage DASH diet and regular exercise.   Major Depressive Disorder, Adult Major depressive disorder (MDD) is a mental health condition. MDD often makes you feel sad, hopeless, or helpless. MDD can also cause symptoms in your body. MDD can affect your:  Work.  School.  Relationships.  Other normal activities.  MDD can range from mild to very bad. It may occur once (single episode MDD). It can also occur many times (recurrent MDD). The main symptoms of MDD often include:  Feeling sad, depressed, or irritable most of the time.  Loss of interest.  MDD symptoms also include:  Sleeping too much or too little.  Eating too much or too little.  A change in your weight.  Feeling tired (fatigue) or having low energy.  Feeling worthless.  Feeling guilty.  Trouble making decisions.  Trouble thinking clearly.  Thoughts of suicide or harming others.  Feeling weak.  Feeling agitated.  Keeping yourself from being around other people (isolation).  Follow these instructions at home: Activity  Do these things as told by your doctor: ? Go back to your normal activities. ? Exercise regularly. ? Spend time outdoors. Alcohol  Talk with your doctor about how alcohol can affect your antidepressant medicines.  Do not drink alcohol. Or, limit how much alcohol you drink. ? This means no more than 1 drink a day for nonpregnant women and 2 drinks a day for men. One drink equals one of these:  12 oz of beer.  5 oz of wine.  1 oz of hard liquor. General instructions  Take over-the-counter and prescription medicines only as told by your doctor.  Eat a healthy diet.  Get plenty of sleep.  Find activities that you enjoy. Make time to do them.  Think about joining a support  group. Your doctor may be able to suggest a group for you.  Keep all follow-up visits as told by your doctor. This is important. Where to find more information:  [place] Mental Illness: ? www.nami.[facility] Heart ? whttps://carter.com/ National Suicide Prevention Lifeline: ? 1(248) 447-0909 This is free, 24-hour help. Contact a doctor if:  Your symptoms get worse.  You have new symptoms. Get help right away if:  You self-harm.  You see, hear, taste, smell, or feel things that are not present (hallucinate). If you ever feel like you may hurt yourself or others, or have thoughts about taking your own life, get help right away. You can go to your nearest emergency department or call:  Your local emergency services (911 in the [place]  A suicide crisis helpline, such as the National Suicide Prevention Lifeline: ? 1641 051 4445 This is open 24 hours a day.  This information is not intended to replace advice given to you by your health care provider. Make sure you discuss any questions you have with your health care provider. Document Released: 10/21/2015 Document Revised: 07/26/2016 Document Reviewed: 07/26/2016 Elsevier Interactive Patient Education  2017 [place]aintenance, [place] Adopting a healthy lifestyle and getting preventive care can go a long way to promote health and wellness. Talk with your health care provider about what schedule of regular examinations is right for you. This is a good chance for you to check in with your provider about disease prevention and staying healthy. In  between checkups, there are plenty of things you can do on your own. Experts have done a lot of research about which lifestyle changes and preventive measures are most likely to keep you healthy. Ask your health care provider for more information. Weight and diet Eat a healthy diet  Be sure to include plenty of vegetables, fruits,  low-fat dairy products, and lean protein.  Do not eat a lot of foods high in solid fats, added sugars, or salt.  Get regular exercise. This is one of the most important things you can do for your health. ? Most adults should exercise for at least 150 minutes each week. The exercise should increase your heart rate and make you sweat (moderate-intensity exercise). ? Most adults should also do strengthening exercises at least twice a week. This is in addition to the moderate-intensity exercise.  Maintain a healthy weight  Body mass index (BMI) is a measurement that can be used to identify possible weight problems. It estimates body fat based on height and weight. Your health care provider can help determine your BMI and help you achieve or maintain a healthy weight.  For females 74 years of age and older: ? A BMI below 18.5 is considered underweight. ? A BMI of 18.5 to 24.9 is normal. ? A BMI of 25 to 29.9 is considered overweight. ? A BMI of 30 and above is considered obese.  Watch levels of cholesterol and blood lipids  You should start having your blood tested for lipids and cholesterol at 33 years of age, then have this test every 5 years.  You may need to have your cholesterol levels checked more often if: ? Your lipid or cholesterol levels are high. ? You are older than 33 years of age. ? You are at high risk for heart disease.  Cancer screening Lung Cancer  Lung cancer screening is recommended for adults 37-64 years old who are at high risk for lung cancer because of a history of smoking.  A yearly low-dose CT scan of the lungs is recommended for people who: ? Currently smoke. ? Have quit within the past 15 years. ? Have at least a 30-pack-year history of smoking. A pack year is smoking an average of one pack of cigarettes a day for 1 year.  Yearly screening should continue until it has been 15 years since you quit.  Yearly screening should stop if you develop a health  problem that would prevent you from having lung cancer treatment.  Breast Cancer  Practice breast self-awareness. This means understanding how your breasts normally appear and feel.  It also means doing regular breast self-exams. Let your health care provider know about any changes, no matter how small.  If you are in your 20s or 30s, you should have a clinical breast exam (CBE) by a health care provider every 1-3 years as part of a regular health exam.  If you are 70 or older, have a CBE every year. Also consider having a breast X-ray (mammogram) every year.  If you have a family history of breast cancer, talk to your health care provider about genetic screening.  If you are at high risk for breast cancer, talk to your health care provider about having an MRI and a mammogram every year.  Breast cancer gene (BRCA) assessment is recommended for women who have family members with BRCA-related cancers. BRCA-related cancers include: ? Breast. ? Ovarian. ? Tubal. ? Peritoneal cancers.  Results of the assessment will determine the need  for genetic counseling and BRCA1 and BRCA2 testing.  Cervical Cancer Your health care provider may recommend that you be screened regularly for cancer of the pelvic organs (ovaries, uterus, and vagina). This screening involves a pelvic examination, including checking for microscopic changes to the surface of your cervix (Pap test). You may be encouraged to have this screening done every 3 years, beginning at age 63.  For women ages 30-65, health care providers may recommend pelvic exams and Pap testing every 3 years, or they may recommend the Pap and pelvic exam, combined with testing for human papilloma virus (HPV), every 5 years. Some types of HPV increase your risk of cervical cancer. Testing for HPV may also be done on women of any age with unclear Pap test results.  Other health care providers may not recommend any screening for nonpregnant women who are  considered low risk for pelvic cancer and who do not have symptoms. Ask your health care provider if a screening pelvic exam is right for you.  If you have had past treatment for cervical cancer or a condition that could lead to cancer, you need Pap tests and screening for cancer for at least 20 years after your treatment. If Pap tests have been discontinued, your risk factors (such as having a new sexual partner) need to be reassessed to determine if screening should resume. Some women have medical problems that increase the chance of getting cervical cancer. In these cases, your health care provider may recommend more frequent screening and Pap tests.  Colorectal Cancer  This type of cancer can be detected and often prevented.  Routine colorectal cancer screening usually begins at 33 years of age and continues through 33 years of age.  Your health care provider may recommend screening at an earlier age if you have risk factors for colon cancer.  Your health care provider may also recommend using home test kits to check for hidden blood in the stool.  A small camera at the end of a tube can be used to examine your colon directly (sigmoidoscopy or colonoscopy). This is done to check for the earliest forms of colorectal cancer.  Routine screening usually begins at age 44.  Direct examination of the colon should be repeated every 5-10 years through 33 years of age. However, you may need to be screened more often if early forms of precancerous polyps or small growths are found.  Skin Cancer  Check your skin from head to toe regularly.  Tell your health care provider about any new moles or changes in moles, especially if there is a change in a mole's shape or color.  Also tell your health care provider if you have a mole that is larger than the size of a pencil eraser.  Always use sunscreen. Apply sunscreen liberally and repeatedly throughout the day.  Protect yourself by wearing long  sleeves, pants, a wide-brimmed hat, and sunglasses whenever you are outside.  Heart disease, diabetes, and high blood pressure  High blood pressure causes heart disease and increases the risk of stroke. High blood pressure is more likely to develop in: ? People who have blood pressure in the high end of the normal range (130-139/85-89 mm Hg). ? People who are overweight or obese. ? People who are African American.  If you are 20-40 years of age, have your blood pressure checked every 3-5 years. If you are 68 years of age or older, have your blood pressure checked every year. You should have your blood  pressure measured twice-once when you are at a hospital or clinic, and once when you are not at a hospital or clinic. Record the average of the two measurements. To check your blood pressure when you are not at a hospital or clinic, you can use: ? An automated blood pressure machine at a pharmacy. ? A home blood pressure monitor.  If you are between 70 years and 46 years old, ask your health care provider if you should take aspirin to prevent strokes.  Have regular diabetes screenings. This involves taking a blood sample to check your fasting blood sugar level. ? If you are at a normal weight and have a low risk for diabetes, have this test once every three years after 33 years of age. ? If you are overweight and have a high risk for diabetes, consider being tested at a younger age or more often. Preventing infection Hepatitis B  If you have a higher risk for hepatitis B, you should be screened for this virus. You are considered at high risk for hepatitis B if: ? You were born in a country where hepatitis B is common. Ask your health care provider which countries are considered high risk. ? Your parents were born in a high-risk country, and you have not been immunized against hepatitis B (hepatitis B vaccine). ? You have HIV or AIDS. ? You use needles to inject street drugs. ? You live with  someone who has hepatitis B. ? You have had sex with someone who has hepatitis B. ? You get hemodialysis treatment. ? You take certain medicines for conditions, including cancer, organ transplantation, and autoimmune conditions.  Hepatitis C  Blood testing is recommended for: ? Everyone born from [birth year] through 1966. ? Anyone with known risk factors for hepatitis C.  Sexually transmitted infections (STIs)  You should be screened for sexually transmitted infections (STIs) including gonorrhea and chlamydia if: ? You are sexually active and are younger than 33 years of age. ? You are older than 33 years of age and your health care provider tells you that you are at risk for this type of infection. ? Your sexual activity has changed since you were last screened and you are at an increased risk for chlamydia or gonorrhea. Ask your health care provider if you are at risk.  If you do not have HIV, but are at risk, it may be recommended that you take a prescription medicine daily to prevent HIV infection. This is called pre-exposure prophylaxis (PrEP). You are considered at risk if: ? You are sexually active and do not regularly use condoms or know the HIV status of your partner(s). ? You take drugs by injection. ? You are sexually active with a partner who has HIV.  Talk with your health care provider about whether you are at high risk of being infected with HIV. If you choose to begin PrEP, you should first be tested for HIV. You should then be tested every 3 months for as long as you are taking PrEP. Pregnancy  If you are premenopausal and you may become pregnant, ask your health care provider about preconception counseling.  If you may become pregnant, take 400 to 800 micrograms (mcg) of folic acid every day.  If you want to prevent pregnancy, talk to your health care provider about birth control (contraception). Osteoporosis and menopause  Osteoporosis is a disease in which the bones lose  minerals and strength with aging. This can result in serious bone fractures. Your risk  for osteoporosis can be identified using a bone density scan.  If you are 81 years of age or older, or if you are at risk for osteoporosis and fractures, ask your health care provider if you should be screened.  Ask your health care provider whether you should take a calcium or vitamin D supplement to lower your risk for osteoporosis.  Menopause may have certain physical symptoms and risks.  Hormone replacement therapy may reduce some of these symptoms and risks. Talk to your health care provider about whether hormone replacement therapy is right for you. Follow these instructions at home:  Schedule regular health, dental, and eye exams.  Stay current with your immunizations.  Do not use any tobacco products including cigarettes, chewing tobacco, or electronic cigarettes.  If you are pregnant, do not drink alcohol.  If you are breastfeeding, limit how much and how often you drink alcohol.  Limit alcohol intake to no more than 1 drink per day for nonpregnant women. One drink equals 12 ounces of beer, 5 ounces of wine, or 1 ounces of hard liquor.  Do not use street drugs.  Do not share needles.  Ask your health care provider for help if you need support or information about quitting drugs.  Tell your health care provider if you often feel depressed.  Tell your health care provider if you have ever been abused or do not feel safe at home. This information is not intended to replace advice given to you by your health care provider. Make sure you discuss any questions you have with your health care provider. Document Released: 05/25/2011 Document Revised: 04/16/2016 Document Reviewed: 08/13/2015 Elsevier Interactive Patient Education  [employer].

## 2018-04-14 DIAGNOSIS — F39 Unspecified mood [affective] disorder: Secondary | ICD-10-CM | POA: Insufficient documentation

## 2018-05-04 ENCOUNTER — Encounter: Payer: Self-pay | Admitting: Nurse Practitioner

## 2018-05-31 ENCOUNTER — Encounter (HOSPITAL_COMMUNITY): Payer: Self-pay | Admitting: Emergency Medicine

## 2018-05-31 ENCOUNTER — Other Ambulatory Visit: Payer: Self-pay

## 2018-05-31 ENCOUNTER — Emergency Department (HOSPITAL_COMMUNITY)
Admission: EM | Admit: 2018-05-31 | Discharge: 2018-05-31 | Disposition: A | Discharge status: Home / Self Care | Payer: PRIVATE HEALTH INSURANCE | Attending: Emergency Medicine | Admitting: Emergency Medicine

## 2018-05-31 DIAGNOSIS — K0889 Other specified disorders of teeth and supporting structures: Secondary | ICD-10-CM | POA: Insufficient documentation

## 2018-05-31 DIAGNOSIS — Z79899 Other long term (current) drug therapy: Secondary | ICD-10-CM | POA: Insufficient documentation

## 2018-05-31 DIAGNOSIS — Z87891 Personal history of nicotine dependence: Secondary | ICD-10-CM | POA: Insufficient documentation

## 2018-05-31 DIAGNOSIS — I10 Essential (primary) hypertension: Secondary | ICD-10-CM | POA: Insufficient documentation

## 2018-05-31 MED ORDER — NAPROXEN 500 MG PO TABS: 500.0000 mg | ORAL_TABLET | Freq: Two times a day (BID) | ORAL | 0 refills | Status: DC

## 2018-05-31 MED ORDER — PENICILLIN V POTASSIUM 500 MG PO TABS: 500.0000 mg | ORAL_TABLET | Freq: Four times a day (QID) | ORAL | 0 refills | Status: AC

## 2018-05-31 MED ORDER — OXYCODONE-ACETAMINOPHEN 5-325 MG PO TABS
1.0000 | ORAL_TABLET | Freq: Once | ORAL | Status: AC
  Administered 2018-05-31: 1 via ORAL
  Filled 2018-05-31: qty 1

## 2018-05-31 NOTE — ED Provider Notes (Signed)
[facility] Provider Note   CSN: [id number] Arrival date & time: 05/31/18  1046     History   Chief Complaint Chief Complaint  Patient presents with  . Dental Problem    HPI Jessica Greene is a 32 y.o. female with a hx of GERD, HTN, and hyperlipidemia who presents to the ED with complaints of dental pain which has been progressively worsening x 1 month.  Patient states that she chipped a tooth to the left lower gumline about a month ago.  Since then she is had progressively worsening pain to this area.  She feels that the pain radiates to the left ear.  Rates her pain a 10 out of 10 in severity, no specific alleviating or aggravating factors, she has tried Aleve without relief. She has an appointment to see a dentist 07/15.  Denies fever, chills, drooling, intraoral drainage, or neck pain/stiffness.  HPI  Past Medical History:  Diagnosis Date  . GERD (gastroesophageal reflux disease)   . Hernia of abdominal cavity   . High cholesterol   . Hypertension   . Polycystic ovarian syndrome   . Uterine polyp 2017    Patient Active Problem List   Diagnosis Date Noted  . Mood disorder (HCC) 04/14/2018  . Uterine polyp 02/28/2018  . Hyperglycemia 02/28/2018  . HTN (hypertension) 02/28/2018  . GERD (gastroesophageal reflux disease) 02/28/2018    Past Surgical History:  Procedure Laterality Date  . ESOPHAGOGASTRODUODENOSCOPY ENDOSCOPY  2013  . POLYPECTOMY  06/29/2016   s/p hysteroscopic polypectomy      OB History    Gravida  1   Para      Term      Preterm      AB  1   Living        SAB  1   TAB      Ectopic      Multiple      Live Births               Home Medications    Prior to Admission medications   Medication Sig Start Date End Date Taking? Authorizing Provider  hydrochlorothiazide (MICROZIDE) 12.5 MG capsule Take 1 capsule (12.5 mg total) by mouth daily. 04/13/18   Nche, Bonna Gainsharlotte Lum, NP    norethindrone (MICRONOR,CAMILA,ERRIN) 0.35 MG tablet Take 1 tablet (0.35 mg total) by mouth daily. 03/21/18   Constant, Peggy, MD    Family History Family History  Problem Relation Age of Onset  . Diabetes Mother   . Hypertension Mother   . Hyperlipidemia Mother   . Heart disease Mother   . Hypertension Father   . Diabetes Father   . Hyperlipidemia Father   . Heart disease Father   . Stroke Father   . Cancer Father        pancreatic  . Heart disease Sister   . Depression Sister   . Bipolar disorder Sister   . Heart disease Maternal Aunt   . Heart disease Maternal Uncle   . Heart disease Paternal Aunt   . Heart disease Paternal Uncle   . Heart disease Maternal Grandmother   . Heart disease Maternal Grandfather   . Heart disease Paternal Grandmother   . Heart disease Paternal Grandfather   . Birth defects Paternal Grandfather     Social History Social History   Tobacco Use  . Smoking status: Former Games developermoker  . Smokeless tobacco: Never Used  Substance Use Topics  . Alcohol use: Yes  Comment: occasionally  . Drug use: Yes    Frequency: 1.0 times per week    Types: Marijuana    Comment: use of extasy in past, last used 2009     Allergies   Patient has no known allergies.   Review of Systems Review of Systems  Constitutional: Negative for chills and fever.  HENT: Positive for dental problem and ear pain. Negative for ear discharge and sore throat.   Respiratory: Negative for shortness of breath.   Musculoskeletal: Negative for neck pain and neck stiffness.     Physical Exam Updated Vital Signs BP 134/85 (BP Location: Right Arm)   Pulse (!) 124   Temp 98.1 F (36.7 C) (Oral)   Resp 20   Ht 5\' 7"  (1.702 m)   Wt 117.9 kg (260 lb)   SpO2 100%   BMI 40.72 kg/m   Physical Exam  Constitutional: She appears well-developed and well-nourished.  Non-toxic appearance. No distress.  HENT:  Head: Normocephalic and atraumatic.  Right Ear: Tympanic membrane,  external ear and ear canal normal. No mastoid tenderness. Tympanic membrane is not perforated, not erythematous, not retracted and not bulging.  Left Ear: Tympanic membrane, external ear and ear canal normal. No mastoid tenderness. Tympanic membrane is not perforated, not erythematous, not retracted and not bulging.  Nose: Nose normal.  Mouth/Throat: Uvula is midline and oropharynx is clear and moist. No uvula swelling. No oropharyngeal exudate, posterior oropharyngeal erythema or tonsillar abscesses.    Patient is tolerating her own secretions without difficulty.  No trismus.  No drooling.  No hot potato voice.  Some mandibular compartment is soft.    Eyes: Conjunctivae are normal. Right eye exhibits no discharge. Left eye exhibits no discharge.  Neck: Normal range of motion. Neck supple.  Cardiovascular: Normal rate and regular rhythm.  No murmur heard. Pulmonary/Chest: Effort normal and breath sounds normal. No respiratory distress.  Lymphadenopathy:    She has no cervical adenopathy.  Neurological: She is alert.  Psychiatric: She has a normal mood and affect. Her behavior is normal. Thought content normal.  Nursing note and vitals reviewed.    ED Treatments / Results  Labs (all labs ordered are listed, but only abnormal results are displayed) Labs Reviewed - No data to display  EKG None  Radiology No results found.  Procedures Procedures (including critical care time)  Medications Ordered in ED Medications - No data to display   Initial Impression / Assessment and Plan / ED Course  I have reviewed the triage vital signs and the nursing notes.  Pertinent labs & imaging results that were available during my care of the patient were reviewed by me and considered in my medical decision making (see chart for details).    Patient presents with dental pain. Patient is nontoxic appearing, in no apparent distress, vitals notable for initial tachycardia which resolved on my exam  and repeat vitals, otherwise WNL.  No gross abscess.  Exam unconcerning for Ludwig's angina or spread of infection.  Will treat with Penicillin VK and Naproxen (last creatinine WNL).  Urged patient to follow-up with dentist, dental resources were provided.  Discussed treatment plan and need for follow up as well as return precautions. Provided opportunity for questions, patient confirmed understanding and is agreeable to plan.  Vitals:   05/31/18 1319 05/31/18 1321 05/31/18 1324 05/31/18 1328  BP: 124/88     Pulse:   80 81  Resp:      Temp:  98.2 F (36.8 C)  TempSrc:  Oral    SpO2:  100% 98% 98%  Weight:      Height:        Final Clinical Impressions(s) / ED Diagnoses   Final diagnoses:  Pain, dental    ED Discharge Orders        Ordered    penicillin v potassium (VEETID) 500 MG tablet  4 times daily     05/31/18 1300    naproxen (NAPROSYN) 500 MG tablet  2 times daily     05/31/18 1300       Petrucelli, [place], PA-C 05/31/18 1712    Benjiman Core, MD 06/01/18 [phone]

## 2018-05-31 NOTE — ED Triage Notes (Signed)
Pt. Stated, I had a tooth broke off a month ago and its been bothering me more and more. Suppose to go next Monday and can't wait. The pain is too bad.

## 2018-05-31 NOTE — Discharge Instructions (Addendum)
Call one of the dentists offices provided to schedule an appointment for re-evaluation and further management within the next 48 hours or call your dentist you were planning to see.   I have prescribed you Penicillin which is an antibiotic to treat the infection and Naproxen which is an anti-inflammatory medicine to treat the pain.   Please take all of your antibiotics until finished. You may develop abdominal discomfort or diarrhea from the antibiotic.  You may help offset this with probiotics which you can buy at the store (ask your pharmacist if unable to find) or get probiotics in the form of eating yogurt. Do not eat or take the probiotics until 2 hours after your antibiotic. If you are unable to tolerate these side effects follow-up with your primary care provider or return to the emergency department.   If you begin to experience any blistering, rashes, swelling, or difficulty breathing seek medical care for evaluation of potentially more serious side effects.   Be sure to eat something when taking the Naproxen as it can cause stomach upset and at worst stomach bleeding. Do not take additional non steroidal anti-inflammatory medicines such as Ibuprofen, Aleve, or Advil while taking Naproxen. You may supplement with Tylenol.   Please be aware that this medications may interact with other medications you are taking, please be sure to discuss your medication list with your pharmacist. If you are taking birth control the antibiotic will deactivate your birth control for 2 weeks.   If you start to experience and new or worsening symptoms return to the emergency department. If you start to experience fever, chills, neck stiffness/pain, or inability to move your neck come back to the emergency department immediately.

## 2018-06-10 ENCOUNTER — Ambulatory Visit: Payer: PRIVATE HEALTH INSURANCE | Admitting: Psychology

## 2018-06-17 ENCOUNTER — Ambulatory Visit: Payer: PRIVATE HEALTH INSURANCE | Admitting: Psychology

## 2018-06-24 ENCOUNTER — Ambulatory Visit: Payer: PRIVATE HEALTH INSURANCE | Admitting: Psychology

## 2018-07-14 ENCOUNTER — Ambulatory Visit: Payer: PRIVATE HEALTH INSURANCE | Admitting: Nurse Practitioner

## 2018-10-31 ENCOUNTER — Emergency Department (HOSPITAL_COMMUNITY)
Admission: EM | Admit: 2018-10-31 | Discharge: 2018-10-31 | Disposition: A | Discharge status: Home / Self Care | Payer: Self-pay | Attending: Emergency Medicine | Admitting: Emergency Medicine

## 2018-10-31 ENCOUNTER — Encounter (HOSPITAL_COMMUNITY): Payer: Self-pay

## 2018-10-31 ENCOUNTER — Other Ambulatory Visit: Payer: Self-pay

## 2018-10-31 ENCOUNTER — Emergency Department (HOSPITAL_COMMUNITY): Payer: Self-pay

## 2018-10-31 DIAGNOSIS — M79602 Pain in left arm: Secondary | ICD-10-CM | POA: Insufficient documentation

## 2018-10-31 DIAGNOSIS — R1031 Right lower quadrant pain: Secondary | ICD-10-CM | POA: Insufficient documentation

## 2018-10-31 DIAGNOSIS — M545 Low back pain: Secondary | ICD-10-CM | POA: Insufficient documentation

## 2018-10-31 DIAGNOSIS — R300 Dysuria: Secondary | ICD-10-CM | POA: Insufficient documentation

## 2018-10-31 DIAGNOSIS — F17210 Nicotine dependence, cigarettes, uncomplicated: Secondary | ICD-10-CM | POA: Insufficient documentation

## 2018-10-31 DIAGNOSIS — I10 Essential (primary) hypertension: Secondary | ICD-10-CM | POA: Insufficient documentation

## 2018-10-31 DIAGNOSIS — R0789 Other chest pain: Secondary | ICD-10-CM | POA: Insufficient documentation

## 2018-10-31 DIAGNOSIS — R109 Unspecified abdominal pain: Secondary | ICD-10-CM

## 2018-10-31 LAB — COMPREHENSIVE METABOLIC PANEL
ALT: 20 U/L (ref 0–44)
AST: 18 U/L (ref 15–41)
Albumin: 4.1 g/dL (ref 3.5–5.0)
Alkaline Phosphatase: 69 U/L (ref 38–126)
Anion gap: 12 (ref 5–15)
BUN: 7 mg/dL (ref 6–20)
CO2: 25 mmol/L (ref 22–32)
Calcium: 9.2 mg/dL (ref 8.9–10.3)
Chloride: 101 mmol/L (ref 98–111)
Creatinine, Ser: 0.71 mg/dL (ref 0.44–1.00)
GFR calc Af Amer: >60 mL/min (ref >60)
GFR calc non Af Amer: >60 mL/min (ref >60)
Glucose, Bld: 98 mg/dL (ref 70–99)
Potassium: 3.4 mmol/L — ABNORMAL LOW (ref 3.5–5.1)
Sodium: 138 mmol/L (ref 135–145)
Total Bilirubin: 0.6 mg/dL (ref 0.3–1.2)
Total Protein: 7.6 g/dL (ref 6.5–8.1)

## 2018-10-31 LAB — CBC WITH DIFFERENTIAL/PLATELET
Abs Immature Granulocytes: 0 10*3/uL (ref 0.00–0.07)
Basophils Absolute: 0 10*3/uL (ref 0.0–0.1)
Basophils Relative: 1 %
Eosinophils Absolute: 0.2 10*3/uL (ref 0.0–0.5)
Eosinophils Relative: 3 %
HCT: 39.8 % (ref 36.0–46.0)
Hemoglobin: 12.3 g/dL (ref 12.0–15.0)
Immature Granulocytes: 0 %
Lymphocytes Relative: 52 %
Lymphs Abs: 3.2 10*3/uL (ref 0.7–4.0)
MCH: 28.7 pg (ref 26.0–34.0)
MCHC: 30.9 g/dL (ref 30.0–36.0)
MCV: 93 fL (ref 80.0–100.0)
Monocytes Absolute: 0.5 10*3/uL (ref 0.1–1.0)
Monocytes Relative: 8 %
Neutro Abs: 2.2 10*3/uL (ref 1.7–7.7)
Neutrophils Relative %: 36 %
Platelets: 247 10*3/uL (ref 150–400)
RBC: 4.28 MIL/uL (ref 3.87–5.11)
RDW: 13.8 % (ref 11.5–15.5)
WBC: 6.1 10*3/uL (ref 4.0–10.5)
nRBC: 0 % (ref 0.0–0.2)

## 2018-10-31 LAB — CBG MONITORING, ED: Glucose-Capillary: 91 mg/dL (ref 70–99)

## 2018-10-31 LAB — RAPID HIV SCREEN (HIV 1/2 AB+AG)
HIV 1/2 Antibodies: NONREACTIVE
HIV-1 P24 Antigen - HIV24: NONREACTIVE

## 2018-10-31 LAB — URINALYSIS, ROUTINE W REFLEX MICROSCOPIC
Bilirubin Urine: NEGATIVE
Glucose, UA: NEGATIVE mg/dL
Ketones, ur: NEGATIVE mg/dL
Leukocytes, UA: NEGATIVE
Nitrite: NEGATIVE
Protein, ur: NEGATIVE mg/dL
Specific Gravity, Urine: 1.025 (ref 1.005–1.030)
pH: 5 (ref 5.0–8.0)

## 2018-10-31 LAB — TROPONIN I: Troponin I: <0.03 ng/mL (ref <0.03)

## 2018-10-31 LAB — URINALYSIS, MICROSCOPIC (REFLEX): RBC / HPF: NONE SEEN RBC/hpf (ref 0–5)

## 2018-10-31 LAB — WET PREP, GENITAL
Sperm: NONE SEEN
Trich, Wet Prep: NONE SEEN
Yeast Wet Prep HPF POC: NONE SEEN

## 2018-10-31 LAB — I-STAT BETA HCG BLOOD, ED (MC, WL, AP ONLY): I-stat hCG, quantitative: <5 m[IU]/mL (ref <5)

## 2018-10-31 NOTE — ED Notes (Signed)
Patient verbalizes understanding of discharge instructions. Opportunity for questioning and answers were provided. Armband removed by staff, pt discharged from ED.  

## 2018-10-31 NOTE — ED Provider Notes (Signed)
[facility] EMERGENCY DEPARTMENT Provider Note   CSN: [id number] Arrival date & time: 10/31/18  1132     History   Chief Complaint Chief Complaint  Patient presents with  . Flank Pain  . Urinary Tract Infection  . Vaginal Discharge    HPI Jessica Greene is a 32 y.o. female.  HPI Patient presents with multiple complaints.  Patient states that she is generally well aside from a history of hypertension, and family history of cardiac disease. She now presents with concerns largely about chest pressure, left arm pain, right flank and right back pain. She notes that she has had the chest pain, left arm pain for about 3 days, is now persistent, sore, moderate, not clearly worse with exertion, breathing, or activity. No new dyspnea, no syncope, no fever, no chills. A longer concern is ongoing pain in her right flank, right lower back, no abdominal pain. This is been present for about 3 weeks, with associated dysuria, and some vaginal discharge. She notes that about 1 month ago she had sex with another individual behind her husband, and the condom broke. She subsequently was tested for chlamydia, according to her, in [place]. She received oral antibiotics. However, she continues to complain of dysuria, discharge.   Past Medical History:  Diagnosis Date  . GERD (gastroesophageal reflux disease)   . Hernia of abdominal cavity   . High cholesterol   . Hypertension   . Polycystic ovarian syndrome   . Uterine polyp 2017    Patient Active Problem List   Diagnosis Date Noted  . Mood disorder (HCC) 04/14/2018  . Uterine polyp 02/28/2018  . Hyperglycemia 02/28/2018  . HTN (hypertension) 02/28/2018  . GERD (gastroesophageal reflux disease) 02/28/2018    Past Surgical History:  Procedure Laterality Date  . ESOPHAGOGASTRODUODENOSCOPY ENDOSCOPY  2013  . POLYPECTOMY  06/29/2016   s/p hysteroscopic polypectomy      OB History    Gravida  1   Para      Term      Preterm      AB  1   Living        SAB  1   TAB      Ectopic      Multiple      Live Births               Home Medications    Prior to Admission medications   Medication Sig Start Date End Date Taking? Authorizing Provider  hydrochlorothiazide (MICROZIDE) 12.5 MG capsule Take 1 capsule (12.5 mg total) by mouth daily. 04/13/18   Nche, Bonna Gains, NP  naproxen (NAPROSYN) 500 MG tablet Take 1 tablet (500 mg total) by mouth 2 (two) times daily. 05/31/18   Petrucelli, Samantha R, PA-C  norethindrone (MICRONOR,CAMILA,ERRIN) 0.35 MG tablet Take 1 tablet (0.35 mg total) by mouth daily. 03/21/18   Constant, Peggy, MD    Family History Family History  Problem Relation Age of Onset  . Diabetes Mother   . Hypertension Mother   . Hyperlipidemia Mother   . Heart disease Mother   . Hypertension Father   . Diabetes Father   . Hyperlipidemia Father   . Heart disease Father   . Stroke Father   . Cancer Father        pancreatic  . Heart disease Sister   . Depression Sister   . Bipolar disorder Sister   . Heart disease Maternal Aunt   . Heart disease Maternal Uncle   .  Heart disease Paternal Aunt   . Heart disease Paternal Uncle   . Heart disease Maternal Grandmother   . Heart disease Maternal Grandfather   . Heart disease Paternal Grandmother   . Heart disease Paternal Grandfather   . Birth defects Paternal Grandfather     Social History Social History   Tobacco Use  . Smoking status: Former Games developer  . Smokeless tobacco: Never Used  Substance Use Topics  . Alcohol use: Yes    Comment: occasionally  . Drug use: Yes    Frequency: 1.0 times per week    Types: Marijuana    Comment: use of extasy in past, last used 2009     Allergies   Patient has no known allergies.   Review of Systems Review of Systems  Constitutional:       Per HPI, otherwise negative  HENT:       Per HPI, otherwise negative  Respiratory:       Per HPI, otherwise  negative  Cardiovascular:       Per HPI, otherwise negative  Gastrointestinal: Negative for abdominal pain and vomiting.  Endocrine:       Negative aside from HPI  Genitourinary:       Neg aside from HPI   Musculoskeletal:       Per HPI, otherwise negative  Skin: Negative.   Allergic/Immunologic: Negative for immunocompromised state.  Neurological: Negative for syncope.     Physical Exam Updated Vital Signs Ht 5\' 8"  (1.727 m)   Wt 116.1 kg   BMI 38.92 kg/m   Physical Exam  Constitutional: She is oriented to person, place, and time. She appears well-developed and well-nourished. No distress.  HENT:  Head: Normocephalic and atraumatic.  Eyes: Conjunctivae and EOM are normal.  Cardiovascular: Normal rate and regular rhythm.  Pulmonary/Chest: Effort normal and breath sounds normal. No stridor. No respiratory distress.  Abdominal: She exhibits no distension.  No abdominal tenderness palpation and minimal discomfort with pressure on the suprapubic region  Genitourinary:    Musculoskeletal: She exhibits no edema.  Neurological: She is alert and oriented to person, place, and time. No cranial nerve deficit.  Skin: Skin is warm and dry.  Psychiatric: She has a normal mood and affect.  Nursing note and vitals reviewed.    ED Treatments / Results  Labs (all labs ordered are listed, but only abnormal results are displayed) Labs Reviewed  WET PREP, GENITAL - Abnormal; Notable for the following components:      Result Value   Clue Cells Wet Prep HPF POC PRESENT (*)    WBC, Wet Prep HPF POC FEW (*)    All other components within normal limits  COMPREHENSIVE METABOLIC PANEL - Abnormal; Notable for the following components:   Potassium 3.4 (*)    All other components within normal limits  URINALYSIS, ROUTINE W REFLEX MICROSCOPIC - Abnormal; Notable for the following components:   Hgb urine dipstick TRACE (*)    All other components within normal limits  URINALYSIS,  MICROSCOPIC (REFLEX) - Abnormal; Notable for the following components:   Bacteria, UA RARE (*)    All other components within normal limits  TROPONIN I  CBC WITH DIFFERENTIAL/PLATELET  RAPID HIV SCREEN (HIV 1/2 AB+AG)  RPR  CBG MONITORING, ED  I-STAT BETA HCG BLOOD, ED ([facility], [facility], [facility] ONLY)  GC/CHLAMYDIA PROBE AMP ([place]) NOT AT [facility]    EKG EKG Interpretation  Date/Time:  Monday October 31 2018 12:13:01 EST Ventricular Rate:  69 PR Interval:  144 QRS Duration: 84 QT Interval:  432 QTC Calculation: 462 R Axis:   24 Text Interpretation:  Normal sinus rhythm with sinus arrhythmia T wave abnormality Abnormal ekg Confirmed by Gerhard MunchLockwood, Kayra Crowell [phone]([id number]) on 10/31/2018 12:15:28 PM   Radiology Ct Renal Stone Study  Result Date: 10/31/2018 CLINICAL DATA:  Right-sided flank pain EXAM: CT ABDOMEN AND PELVIS WITHOUT CONTRAST TECHNIQUE: Multidetector CT imaging of the abdomen and pelvis was performed following the standard protocol without IV contrast. COMPARISON:  Pelvic ultrasound 04/01/2018 FINDINGS: Lower chest: No acute abnormality. Hepatobiliary: No focal liver abnormality is seen. No gallstones, gallbladder wall thickening, or biliary dilatation. Pancreas: Unremarkable. No pancreatic ductal dilatation or surrounding inflammatory changes. Spleen: Normal in size without focal abnormality. Adrenals/Urinary Tract: Adrenal glands are unremarkable. Kidneys are normal, without renal calculi, focal lesion, or hydronephrosis. Bladder is unremarkable. Stomach/Bowel: Stomach is within normal limits. Appendix appears normal. No evidence of bowel wall thickening, distention, or inflammatory changes. Vascular/Lymphatic: No significant vascular findings are present. No enlarged abdominal or pelvic lymph nodes. Reproductive: 4.3 cm slightly dense mass anterior to the right aspect of the uterus. Other: Negative for free fluid.  No free air Musculoskeletal: No acute or significant osseous findings. IMPRESSION: 1.  Negative for nephrolithiasis, hydronephrosis or ureteral stone. 2. Negative appendix 3. 4.3 cm slightly dense mass anterior to the right aspect of the uterus, could reflect exophytic fibroid versus complex right adnexal lesion. Correlation with pelvic ultrasound could be obtained for further evaluation. Electronically Signed   By: Jasmine PangKim  Fujinaga M.D.   On: 10/31/2018 15:43    Procedures Pelvic exam Date/Time: 10/31/2018 1:00 PM Performed by: Gerhard MunchLockwood, Fawne Hughley, MD Authorized by: Gerhard MunchLockwood, Kasai Beltran, MD  Consent: Verbal consent obtained. Written consent not obtained. Risks and benefits: risks, benefits and alternatives were discussed Consent given by: patient Patient understanding: patient states understanding of the procedure being performed Patient consent: the patient's understanding of the procedure matches consent given Procedure consent: procedure consent matches procedure scheduled Required items: required blood products, implants, devices, and special equipment available Patient identity confirmed: verbally with patient Time out: Immediately prior to procedure a "time out" was called to verify the correct patient, procedure, equipment, support staff and site/side marked as required. Preparation: Patient was prepped and draped in the usual sterile fashion. Local anesthesia used: no  Anesthesia: Local anesthesia used: no  Sedation: Patient sedated: no  Patient tolerance: Patient tolerated the procedure well with no immediate complications    (including critical care time)  Medications Ordered in ED Medications - No data to display   Initial Impression / Assessment and Plan / ED Course  I have reviewed the triage vital signs and the nursing notes.  Pertinent labs & imaging results that were available during my care of the patient were reviewed by me and considered in my medical decision making (see chart for details).    Update:, Patient in no distress.   3:48 PM Patient had  no distress, ambulatory, awake and alert. She is aware of all findings. This patient presents with concern of chest pain, pressure, with family history of cardiac disease, had evaluation, reassuring with no evidence for ongoing coronary ischemia or other acute pathology. Patient's other primary concern was flank pain with vaginal discharge, after recent unprotected sex. Patient's evaluation thus far reassuring, no evidence for stone, nor pyelonephritis. Patient has some studies pending on discharge, but no evidence for UTI currently. With reassuring findings, no evidence of bacteremia, sepsis, and after mentioned reassuring studies, the [facility], management of slight abnormality on CT, most consistent with fibroid.   Final Clinical Impressions(s) / ED Diagnoses   Final diagnoses:  Flank pain     Gerhard Munch, MD 10/31/18 1549

## 2018-10-31 NOTE — ED Triage Notes (Signed)
Pt presents to ED for evaluation of right sided flank pain, urinary frequency, and vaginal discharge x3 weeks. Pt states she was tested for STDs in [place] about a month ago and was diagnosed and treated for chlamydia. Pt states she doesn't know if she now has another STD but would like to be tested again.

## 2018-10-31 NOTE — Discharge Instructions (Addendum)
As discussed, your evaluation today has been largely reassuring.  But, it is important that you monitor your condition carefully, and do not hesitate to return to the ED if you develop new, or concerning changes in your condition. ? ?Otherwise, please follow-up with your physician for appropriate ongoing care. ? ?

## 2018-11-01 LAB — GC/CHLAMYDIA PROBE AMP (~~LOC~~) NOT AT ARMC
Chlamydia: NEGATIVE
Neisseria Gonorrhea: NEGATIVE

## 2018-11-01 LAB — RPR: RPR Ser Ql: NONREACTIVE

## 2018-12-25 ENCOUNTER — Encounter: Payer: Self-pay | Admitting: Emergency Medicine

## 2018-12-25 ENCOUNTER — Emergency Department (HOSPITAL_COMMUNITY)
Admission: EM | Admit: 2018-12-25 | Discharge: 2018-12-26 | Disposition: A | Discharge status: Home / Self Care | Payer: Self-pay | Attending: Emergency Medicine | Admitting: Emergency Medicine

## 2018-12-25 DIAGNOSIS — N760 Acute vaginitis: Secondary | ICD-10-CM

## 2018-12-25 DIAGNOSIS — N938 Other specified abnormal uterine and vaginal bleeding: Secondary | ICD-10-CM | POA: Insufficient documentation

## 2018-12-25 DIAGNOSIS — Z87891 Personal history of nicotine dependence: Secondary | ICD-10-CM | POA: Insufficient documentation

## 2018-12-25 DIAGNOSIS — B9689 Other specified bacterial agents as the cause of diseases classified elsewhere: Secondary | ICD-10-CM

## 2018-12-25 DIAGNOSIS — Z79899 Other long term (current) drug therapy: Secondary | ICD-10-CM | POA: Insufficient documentation

## 2018-12-25 DIAGNOSIS — I10 Essential (primary) hypertension: Secondary | ICD-10-CM | POA: Insufficient documentation

## 2018-12-25 DIAGNOSIS — N939 Abnormal uterine and vaginal bleeding, unspecified: Secondary | ICD-10-CM

## 2018-12-25 LAB — BASIC METABOLIC PANEL
Anion gap: 11 (ref 5–15)
BUN: 8 mg/dL (ref 6–20)
CO2: 25 mmol/L (ref 22–32)
Calcium: 9.2 mg/dL (ref 8.9–10.3)
Chloride: 102 mmol/L (ref 98–111)
Creatinine, Ser: 0.75 mg/dL (ref 0.44–1.00)
GFR calc Af Amer: >60 mL/min (ref >60)
GFR calc non Af Amer: >60 mL/min (ref >60)
Glucose, Bld: 110 mg/dL — ABNORMAL HIGH (ref 70–99)
Potassium: 3.7 mmol/L (ref 3.5–5.1)
Sodium: 138 mmol/L (ref 135–145)

## 2018-12-25 LAB — CBC
HCT: 39.2 % (ref 36.0–46.0)
Hemoglobin: 12.7 g/dL (ref 12.0–15.0)
MCH: 29.9 pg (ref 26.0–34.0)
MCHC: 32.4 g/dL (ref 30.0–36.0)
MCV: 92.2 fL (ref 80.0–100.0)
Platelets: 223 10*3/uL (ref 150–400)
RBC: 4.25 MIL/uL (ref 3.87–5.11)
RDW: 14.1 % (ref 11.5–15.5)
WBC: 7.7 10*3/uL (ref 4.0–10.5)
nRBC: 0 % (ref 0.0–0.2)

## 2018-12-25 NOTE — ED Notes (Signed)
Pt unable to void at this time. 

## 2018-12-25 NOTE — ED Triage Notes (Signed)
Pt reports gen abd cramping, back pain, vaginal bleeding onset last week. Took at home pregnancy test and was positive. Unsure of LMP. Currently no vaginal bleeding, just wants to be checked out.

## 2018-12-26 ENCOUNTER — Other Ambulatory Visit: Payer: Self-pay

## 2018-12-26 LAB — GC/CHLAMYDIA PROBE AMP (~~LOC~~) NOT AT ARMC
Chlamydia: NEGATIVE
Neisseria Gonorrhea: NEGATIVE

## 2018-12-26 LAB — URINALYSIS, ROUTINE W REFLEX MICROSCOPIC
Bilirubin Urine: NEGATIVE
Glucose, UA: NEGATIVE mg/dL
Hgb urine dipstick: NEGATIVE
Ketones, ur: NEGATIVE mg/dL
Leukocytes, UA: NEGATIVE
Nitrite: NEGATIVE
Protein, ur: NEGATIVE mg/dL
Specific Gravity, Urine: 1.023 (ref 1.005–1.030)
pH: 6 (ref 5.0–8.0)

## 2018-12-26 LAB — WET PREP, GENITAL
Sperm: NONE SEEN
Trich, Wet Prep: NONE SEEN
Yeast Wet Prep HPF POC: NONE SEEN

## 2018-12-26 LAB — HCG, QUANTITATIVE, PREGNANCY: hCG, Beta Chain, Quant, S: <1 m[IU]/mL (ref <5)

## 2018-12-26 MED ORDER — METRONIDAZOLE 500 MG PO TABS: 500.0000 mg | ORAL_TABLET | Freq: Two times a day (BID) | ORAL | 0 refills | Status: DC

## 2018-12-26 NOTE — ED Notes (Signed)
Patient verbalizes understanding of discharge instructions. Opportunity for questioning and answers were provided. Armband removed by staff, pt discharged from ED. Ambulated out to lobby  

## 2018-12-26 NOTE — ED Provider Notes (Signed)
[facility]   CSN: [id number] Arrival date & time: 12/25/18  2249     History   Chief Complaint Chief Complaint  Patient presents with  . Abdominal Pain    HPI Jessica Greene is a 33 y.o. female.  Patient presents to the emergency department for evaluation of vaginal bleeding.  Patient reports that she took a home pregnancy test 3 weeks ago and it was positive.  1 week ago she started having lower abdominal and pelvic cramping, back pain and vaginal bleeding.  She reports that her family told her it was normal because she has PCOS, but she has become concerned and wants to get checked out.  She reports that the bleeding was heavy at times over the past week, passing some clots but no tissue.  Bleeding has stopped now and she is not currently having any pain.  Pain is been crampy in nature and intermittent.  She is not sure of her last menstrual period because she is generally irregular.     Past Medical History:  Diagnosis Date  . GERD (gastroesophageal reflux disease)   . Hernia of abdominal cavity   . High cholesterol   . Hypertension   . Polycystic ovarian syndrome   . Uterine polyp 2017    Patient Active Problem List   Diagnosis Date Noted  . Mood disorder (HCC) 04/14/2018  . Uterine polyp 02/28/2018  . Hyperglycemia 02/28/2018  . HTN (hypertension) 02/28/2018  . GERD (gastroesophageal reflux disease) 02/28/2018    Past Surgical History:  Procedure Laterality Date  . ESOPHAGOGASTRODUODENOSCOPY ENDOSCOPY  2013  . POLYPECTOMY  06/29/2016   s/p hysteroscopic polypectomy      OB History    Gravida  2   Para      Term      Preterm      AB  1   Living        SAB  1   TAB      Ectopic      Multiple      Live Births               Home Medications    Prior to Admission medications   Medication Sig Start Date End Date Taking? Authorizing Provider  hydrochlorothiazide (MICROZIDE)  12.5 MG capsule Take 1 capsule (12.5 mg total) by mouth daily. 04/13/18   Nche, Bonna Gainsharlotte Lum, NP  naproxen (NAPROSYN) 500 MG tablet Take 1 tablet (500 mg total) by mouth 2 (two) times daily. 05/31/18   Petrucelli, Samantha R, PA-C  norethindrone (MICRONOR,CAMILA,ERRIN) 0.35 MG tablet Take 1 tablet (0.35 mg total) by mouth daily. 03/21/18   Constant, Peggy, MD    Family History Family History  Problem Relation Age of Onset  . Diabetes Mother   . Hypertension Mother   . Hyperlipidemia Mother   . Heart disease Mother   . Hypertension Father   . Diabetes Father   . Hyperlipidemia Father   . Heart disease Father   . Stroke Father   . Cancer Father        pancreatic  . Heart disease Sister   . Depression Sister   . Bipolar disorder Sister   . Heart disease Maternal Aunt   . Heart disease Maternal Uncle   . Heart disease Paternal Aunt   . Heart disease Paternal Uncle   . Heart disease Maternal Grandmother   . Heart disease Maternal Grandfather   . Heart disease Paternal Grandmother   .  Heart disease Paternal Grandfather   . Birth defects Paternal Grandfather     Social History Social History   Tobacco Use  . Smoking status: Former Games developermoker  . Smokeless tobacco: Never Used  Substance Use Topics  . Alcohol use: Yes    Comment: occasionally  . Drug use: Yes    Frequency: 1.0 times per week    Types: Marijuana    Comment: use of extasy in past, last used 2009     Allergies   Patient has no known allergies.   Review of Systems Review of Systems  Genitourinary: Positive for pelvic pain and vaginal bleeding.  Musculoskeletal: Positive for back pain.  All other systems reviewed and are negative.    Physical Exam Updated Vital Signs There were no vitals taken for this visit.  Physical Exam Vitals signs and nursing note reviewed. Exam conducted with a chaperone present.  Constitutional:      General: She is not in acute distress.    Appearance: Normal appearance. She is  well-developed.  HENT:     Head: Normocephalic and atraumatic.     Right Ear: Hearing normal.     Left Ear: Hearing normal.     Nose: Nose normal.  Eyes:     Conjunctiva/sclera: Conjunctivae normal.     Pupils: Pupils are equal, round, and reactive to light.  Neck:     Musculoskeletal: Normal range of motion and neck supple.  Cardiovascular:     Rate and Rhythm: Regular rhythm.     Heart sounds: S1 normal and S2 normal. No murmur. No friction rub. No gallop.   Pulmonary:     Effort: Pulmonary effort is normal. No respiratory distress.     Breath sounds: Normal breath sounds.  Chest:     Chest wall: No tenderness.  Abdominal:     General: Bowel sounds are normal.     Palpations: Abdomen is soft.     Tenderness: There is no abdominal tenderness. There is no guarding or rebound. Negative signs include Murphy's sign and McBurney's sign.     Hernia: No hernia is present.  Genitourinary:    General: Normal vulva.     Cervix: No cervical motion tenderness.     Uterus: Normal.      Adnexa: Right adnexa normal and left adnexa normal.  Musculoskeletal: Normal range of motion.  Skin:    General: Skin is warm and dry.     Findings: No rash.  Neurological:     Mental Status: She is alert and oriented to person, place, and time.     GCS: GCS eye subscore is 4. GCS verbal subscore is 5. GCS motor subscore is 6.     Cranial Nerves: No cranial nerve deficit.     Sensory: No sensory deficit.     Coordination: Coordination normal.  Psychiatric:        Speech: Speech normal.        Behavior: Behavior normal.        Thought Content: Thought content normal.      ED Treatments / Results  Labs (all labs ordered are listed, but only abnormal results are displayed) Labs Reviewed  BASIC METABOLIC PANEL - Abnormal; Notable for the following components:      Result Value   Glucose, Bld 110 (*)    All other components within normal limits  WET PREP, GENITAL  CBC  HCG, QUANTITATIVE,  PREGNANCY  URINALYSIS, ROUTINE W REFLEX MICROSCOPIC  GC/CHLAMYDIA PROBE AMP ([place]) NOT AT  [facility]    EKG None  Radiology No results found.  Procedures Procedures (including critical care time)  Medications Ordered in ED Medications - No data to display   Initial Impression / Assessment and Plan / ED Course  I have reviewed the triage vital signs and the nursing notes.  Pertinent labs & imaging results that were available during my care of the patient were reviewed by me and considered in my medical decision making (see chart for details).     Patient presents to the ER for evaluation of pelvic pain, back pain, vaginal bleeding.  She reports that she took a pregnancy test 3 weeks ago and it was positive.  She has had bleeding for 1 week.  She reports heavier than normal menstrual period bleeding occluding passage of clots, however, bleeding has stopped.  Pain is been intermittent, not currently present.  Pregnancy test today was negative.  Pelvic exam unremarkable.  Lab work unremarkable.  Patient either had an erroneously positive home pregnancy test, or this represents a missed spontaneous abortion.  She appears well, appropriate for outpatient follow-up with OB/GYN.  Final Clinical Impressions(s) / ED Diagnoses   Final diagnoses:  Abnormal uterine bleeding    ED Discharge Orders    None       Kamiah Fite, Canary Brim, MD 12/26/18 [phone]

## 2019-03-27 ENCOUNTER — Telehealth: Payer: Self-pay | Admitting: Nurse Practitioner

## 2019-03-27 NOTE — Telephone Encounter (Signed)
Called and left vm. Calling to schedule virtual visit with Charlotte.  °

## 2019-12-03 ENCOUNTER — Emergency Department (HOSPITAL_COMMUNITY)
Admission: EM | Admit: 2019-12-03 | Discharge: 2019-12-03 | Disposition: A | Discharge status: Home / Self Care | Payer: Self-pay | Attending: Emergency Medicine | Admitting: Emergency Medicine

## 2019-12-03 ENCOUNTER — Encounter (HOSPITAL_COMMUNITY): Payer: Self-pay | Admitting: Emergency Medicine

## 2019-12-03 ENCOUNTER — Other Ambulatory Visit: Payer: Self-pay

## 2019-12-03 ENCOUNTER — Emergency Department (HOSPITAL_COMMUNITY): Payer: Self-pay

## 2019-12-03 DIAGNOSIS — I10 Essential (primary) hypertension: Secondary | ICD-10-CM | POA: Diagnosis not present

## 2019-12-03 DIAGNOSIS — U071 COVID-19: Secondary | ICD-10-CM | POA: Diagnosis not present

## 2019-12-03 DIAGNOSIS — Z87891 Personal history of nicotine dependence: Secondary | ICD-10-CM | POA: Diagnosis not present

## 2019-12-03 DIAGNOSIS — R509 Fever, unspecified: Secondary | ICD-10-CM | POA: Diagnosis present

## 2019-12-03 LAB — COMPREHENSIVE METABOLIC PANEL
ALT: 24 U/L (ref 0–44)
AST: 30 U/L (ref 15–41)
Albumin: 3.7 g/dL (ref 3.5–5.0)
Alkaline Phosphatase: 74 U/L (ref 38–126)
Anion gap: 9 (ref 5–15)
BUN: 5 mg/dL — ABNORMAL LOW (ref 6–20)
CO2: 22 mmol/L (ref 22–32)
Calcium: 8.7 mg/dL — ABNORMAL LOW (ref 8.9–10.3)
Chloride: 105 mmol/L (ref 98–111)
Creatinine, Ser: 0.74 mg/dL (ref 0.44–1.00)
GFR calc Af Amer: >60 mL/min (ref >60)
GFR calc non Af Amer: >60 mL/min (ref >60)
Glucose, Bld: 121 mg/dL — ABNORMAL HIGH (ref 70–99)
Potassium: 3.7 mmol/L (ref 3.5–5.1)
Sodium: 136 mmol/L (ref 135–145)
Total Bilirubin: 0.4 mg/dL (ref 0.3–1.2)
Total Protein: 6.7 g/dL (ref 6.5–8.1)

## 2019-12-03 LAB — URINALYSIS, ROUTINE W REFLEX MICROSCOPIC
Bacteria, UA: NONE SEEN
Bilirubin Urine: NEGATIVE
Glucose, UA: NEGATIVE mg/dL
Ketones, ur: NEGATIVE mg/dL
Leukocytes,Ua: NEGATIVE
Nitrite: NEGATIVE
Protein, ur: 30 mg/dL — AB
Specific Gravity, Urine: 1.027 (ref 1.005–1.030)
pH: 5 (ref 5.0–8.0)

## 2019-12-03 LAB — RESPIRATORY PANEL BY RT PCR (FLU A&B, COVID)
Influenza A by PCR: NEGATIVE
Influenza B by PCR: NEGATIVE
SARS Coronavirus 2 by RT PCR: POSITIVE — AB

## 2019-12-03 LAB — CBC WITH DIFFERENTIAL/PLATELET
Abs Immature Granulocytes: 0.01 10*3/uL (ref 0.00–0.07)
Basophils Absolute: 0 10*3/uL (ref 0.0–0.1)
Basophils Relative: 1 %
Eosinophils Absolute: 0.2 10*3/uL (ref 0.0–0.5)
Eosinophils Relative: 3 %
HCT: 38.7 % (ref 36.0–46.0)
Hemoglobin: 13 g/dL (ref 12.0–15.0)
Immature Granulocytes: 0 %
Lymphocytes Relative: 25 %
Lymphs Abs: 1.4 10*3/uL (ref 0.7–4.0)
MCH: 31.6 pg (ref 26.0–34.0)
MCHC: 33.6 g/dL (ref 30.0–36.0)
MCV: 93.9 fL (ref 80.0–100.0)
Monocytes Absolute: 0.9 10*3/uL (ref 0.1–1.0)
Monocytes Relative: 16 %
Neutro Abs: 3.2 10*3/uL (ref 1.7–7.7)
Neutrophils Relative %: 55 %
Platelets: 189 10*3/uL (ref 150–400)
RBC: 4.12 MIL/uL (ref 3.87–5.11)
RDW: 12.4 % (ref 11.5–15.5)
WBC: 5.7 10*3/uL (ref 4.0–10.5)
nRBC: 0 % (ref 0.0–0.2)

## 2019-12-03 LAB — POC URINE PREG, ED: Preg Test, Ur: NEGATIVE

## 2019-12-03 MED ORDER — METRONIDAZOLE 500 MG PO TABS: 500.0000 mg | ORAL_TABLET | Freq: Two times a day (BID) | ORAL | 0 refills | Status: DC

## 2019-12-03 MED ORDER — ONDANSETRON HCL 4 MG PO TABS: 4.0000 mg | ORAL_TABLET | Freq: Three times a day (TID) | ORAL | 0 refills | Status: DC | PRN

## 2019-12-03 MED ORDER — LACTATED RINGERS IV BOLUS
1000.0000 mL | Freq: Once | INTRAVENOUS | Status: AC
  Administered 2019-12-03: 02:00:00 1000 mL via INTRAVENOUS

## 2019-12-03 MED ORDER — ACETAMINOPHEN 500 MG PO TABS
1000.0000 mg | ORAL_TABLET | Freq: Once | ORAL | Status: AC
  Administered 2019-12-03: 02:00:00 1000 mg via ORAL
  Filled 2019-12-03: qty 2

## 2019-12-03 NOTE — ED Provider Notes (Signed)
Emergency Department Provider Note   I have reviewed the triage vital signs and the nursing notes.   HISTORY  Chief Complaint Fever (Chills) and Generalized Body Aches   HPI Jessica Greene is a 34 y.o. female with medical problems documented below who presents the emergency department today with multiple complaints.  Patient states that she has felt hot, cough, shortness of breath, diarrhea, nausea, body aches, joint aches and also now headache.  Patient states has been febrile at home up to 100.9.  She denies any sick contacts.  She does have 2 children at home but they do not go to daycare.  Her husband works at [employer] and he has had symptoms as well but refuses to get tested for anything or see a doctor.  Patient states her symptoms have been going on for approximately 48 hours but significantly worse last night prior to presentation here.   No other associated or modifying symptoms.    Past Medical History:  Diagnosis Date  . GERD (gastroesophageal reflux disease)   . Hernia of abdominal cavity   . High cholesterol   . Hypertension   . Polycystic ovarian syndrome   . Uterine polyp 2017    Patient Active Problem List   Diagnosis Date Noted  . Mood disorder ([place]) 04/14/2018  . Uterine polyp 02/28/2018  . Hyperglycemia 02/28/2018  . HTN (hypertension) 02/28/2018  . GERD (gastroesophageal reflux disease) 02/28/2018    Past Surgical History:  Procedure Laterality Date  . ESOPHAGOGASTRODUODENOSCOPY ENDOSCOPY  2013  . POLYPECTOMY  06/29/2016   s/p hysteroscopic polypectomy     Current Outpatient Rx  . Order #: [id number] Class: Normal  . Order #: [id number] Class: Normal  . Order #: [id number] Class: Print  . Order #: [id number] Class: Normal    Allergies Patient has no known allergies.  Family History  Problem Relation Age of Onset  . Diabetes Mother   . Hypertension Mother   . Hyperlipidemia Mother   . Heart disease Mother   . Hypertension Father   .  Diabetes Father   . Hyperlipidemia Father   . Heart disease Father   . Stroke Father   . Cancer Father        pancreatic  . Heart disease Sister   . Depression Sister   . Bipolar disorder Sister   . Heart disease Maternal Aunt   . Heart disease Maternal Uncle   . Heart disease Paternal Aunt   . Heart disease Paternal Uncle   . Heart disease Maternal Grandmother   . Heart disease Maternal Grandfather   . Heart disease Paternal Grandmother   . Heart disease Paternal Grandfather   . Birth defects Paternal Grandfather     Social History Social History   Tobacco Use  . Smoking status: Former Research scientist (life sciences)  . Smokeless tobacco: Never Used  Substance Use Topics  . Alcohol use: Yes    Comment: occasionally  . Drug use: Yes    Frequency: 1.0 times per week    Types: Marijuana    Comment: use of extasy in past, last used 2009    Review of Systems  All other systems negative except as documented in the HPI. All pertinent positives and negatives as reviewed in the HPI. ____________________________________________   PHYSICAL EXAM:  VITAL SIGNS: ED Triage Vitals  Enc Vitals Group     BP 12/03/19 0110 (!) 165/101     Pulse Rate 12/03/19 0110 (!) 136     Resp 12/03/19 0110 20     Temp  12/03/19 0110 (!) 101.9 F (38.8 C)     Temp Source 12/03/19 0110 Oral     SpO2 12/03/19 0110 98 %    Constitutional: Alert and oriented. Well appearing and in no acute distress. Eyes: Conjunctivae are normal. PERRL. EOMI. Head: Atraumatic. Nose: No congestion/rhinnorhea. Mouth/Throat: Mucous membranes are moist.  Oropharynx non-erythematous. Neck: No stridor.  No meningeal signs.   Cardiovascular: tachycardic rate, regular rhythm. Good peripheral circulation. Grossly normal heart sounds.   Respiratory: tachypneic respiratory effort.  No retractions. Lungs slightly diminished bilaterally. Gastrointestinal: Soft and nontender. No distention.  Musculoskeletal: No lower extremity tenderness nor  edema. No gross deformities of extremities. Neurologic:  Normal speech and language. No gross focal neurologic deficits are appreciated.  Skin:  Skin is warm, dry and intact. No rash noted.   ____________________________________________   LABS (all labs ordered are listed, but only abnormal results are displayed)  Labs Reviewed  URINALYSIS, ROUTINE W REFLEX MICROSCOPIC - Abnormal; Notable for the following components:      Result Value   APPearance HAZY (*)    Hgb urine dipstick MODERATE (*)    Protein, ur 30 (*)    All other components within normal limits  RESPIRATORY PANEL BY RT PCR (FLU A&B, COVID)  CBC WITH DIFFERENTIAL/PLATELET  COMPREHENSIVE METABOLIC PANEL  POC URINE PREG, ED   ____________________________________________  EKG   EKG Interpretation  Date/Time:  Sunday December 03 2019 01:12:06 EST Ventricular Rate:  125 PR Interval:  130 QRS Duration: 86 QT Interval:  304 QTC Calculation: 438 R Axis:   19 Text Interpretation: Sinus tachycardia Nonspecific ST abnormality Abnormal ECG When compared with ECG of 10/31/2018, HEART RATE has increased Confirmed by Dione Booze ([id number]) on 12/03/2019 1:20:29 AM      ____________________________________________  RADIOLOGY  DG Chest Portable 1 View  Result Date: 12/03/2019 CLINICAL DATA:  Shortness of breath EXAM: PORTABLE CHEST 1 VIEW COMPARISON:  None. FINDINGS: The heart size and mediastinal contours are within normal limits. Mildly increased hazy airspace opacity seen at the right lung base. The visualized skeletal structures are unremarkable. IMPRESSION: Mildly increased hazy airspace opacity at the right lung base which could be due to atelectasis and/or infectious etiology. Electronically Signed   By: Jonna Clark M.D.   On: 12/03/2019 02:06    ____________________________________________   PROCEDURES  Procedure(s) performed:   Procedures   ____________________________________________   INITIAL IMPRESSION  / ASSESSMENT AND PLAN / ED COURSE  Viral syndrome of some sort. Will test for flu and covid here. Either way not significantly distressed at the moment, will likely be able to be discharged pending results.  Also with mild vag discharge which she states is her normal BV sumptom. deferes pelvic exam but would like treatment for it.   Jessica Greene was evaluated in Emergency Department on 12/03/2019 for the symptoms described in the history of present illness. She was evaluated in the context of the global COVID-19 pandemic, which necessitated consideration that the patient might be at risk for infection with the SARS-CoV-2 virus that causes COVID-19. Institutional protocols and algorithms that pertain to the evaluation of patients at risk for COVID-19 are in a state of rapid change based on information released by regulatory bodies including the CDC and federal and state organizations. These policies and algorithms were followed during the patient's care in the ED.   covid positive. Discussed supportive care and treatments at home. Discussed possible worsening and reasons to return to the hospital for same.  Pertinent labs & imaging results that were available during my care of the patient were reviewed by me and considered in my medical decision making (see chart for details).  A medical screening exam was performed and I feel the patient has had an appropriate workup for their chief complaint at this time and likelihood of emergent condition existing is low. They have been counseled on decision, discharge, follow up and which symptoms necessitate immediate return to the emergency department. They or their family verbally stated understanding and agreement with plan and discharged in stable condition.   ____________________________________________  FINAL CLINICAL IMPRESSION(S) / ED DIAGNOSES  Final diagnoses:  None     MEDICATIONS GIVEN DURING THIS VISIT:  Medications  lactated  ringers bolus 1,000 mL (1,000 mLs Intravenous New Bag/Given 12/03/19 0204)  acetaminophen (TYLENOL) tablet 1,000 mg (1,000 mg Oral Given 12/03/19 0201)     NEW OUTPATIENT MEDICATIONS STARTED DURING THIS VISIT:  New Prescriptions   No medications on file    Note:  This note was prepared with assistance of Dragon voice recognition software. Occasional wrong-word or sound-a-like substitutions may have occurred due to the inherent limitations of voice recognition software.   Martia Dalby, Barbara Cower, MD 12/03/19 [phone]

## 2019-12-03 NOTE — ED Triage Notes (Signed)
Patient presents with multiple complaints: fever with chills , generalized body aches , diarrhea , headache and vaginal discharge this week .

## 2019-12-18 ENCOUNTER — Other Ambulatory Visit: Payer: Medicaid Other

## 2020-12-24 ENCOUNTER — Emergency Department (HOSPITAL_COMMUNITY)
Admission: EM | Admit: 2020-12-24 | Discharge: 2020-12-25 | Disposition: A | Discharge status: Home / Self Care | Payer: Medicaid Other | Attending: Emergency Medicine | Admitting: Emergency Medicine

## 2020-12-24 ENCOUNTER — Encounter (HOSPITAL_COMMUNITY): Payer: Self-pay | Admitting: Emergency Medicine

## 2020-12-24 ENCOUNTER — Other Ambulatory Visit: Payer: Self-pay

## 2020-12-24 DIAGNOSIS — Z87891 Personal history of nicotine dependence: Secondary | ICD-10-CM | POA: Insufficient documentation

## 2020-12-24 DIAGNOSIS — Z79899 Other long term (current) drug therapy: Secondary | ICD-10-CM | POA: Insufficient documentation

## 2020-12-24 DIAGNOSIS — A599 Trichomoniasis, unspecified: Secondary | ICD-10-CM | POA: Insufficient documentation

## 2020-12-24 DIAGNOSIS — I10 Essential (primary) hypertension: Secondary | ICD-10-CM | POA: Insufficient documentation

## 2020-12-24 LAB — URINALYSIS, ROUTINE W REFLEX MICROSCOPIC
Bilirubin Urine: NEGATIVE
Glucose, UA: NEGATIVE mg/dL
Ketones, ur: NEGATIVE mg/dL
Leukocytes,Ua: NEGATIVE
Nitrite: NEGATIVE
Protein, ur: NEGATIVE mg/dL
Specific Gravity, Urine: >1.03 — ABNORMAL HIGH (ref 1.005–1.030)
pH: 5.5 (ref 5.0–8.0)

## 2020-12-24 LAB — URINALYSIS, MICROSCOPIC (REFLEX)

## 2020-12-24 NOTE — ED Triage Notes (Signed)
Pt arrives to ED with c/o of STI. Pt states she was exposed to Trichomonas. Pt reports gray discharge and vaginal burning x3 weeks.

## 2020-12-25 ENCOUNTER — Encounter (HOSPITAL_COMMUNITY): Payer: Self-pay | Admitting: Student

## 2020-12-25 LAB — I-STAT BETA HCG BLOOD, ED (MC, WL, AP ONLY): I-stat hCG, quantitative: <5 m[IU]/mL (ref <5)

## 2020-12-25 LAB — GC/CHLAMYDIA PROBE AMP (~~LOC~~) NOT AT ARMC
Chlamydia: NEGATIVE
Comment: NEGATIVE
Comment: NORMAL
Neisseria Gonorrhea: NEGATIVE

## 2020-12-25 LAB — WET PREP, GENITAL
Clue Cells Wet Prep HPF POC: NONE SEEN
Sperm: NONE SEEN
Yeast Wet Prep HPF POC: NONE SEEN

## 2020-12-25 LAB — RPR: RPR Ser Ql: NONREACTIVE

## 2020-12-25 LAB — HIV ANTIBODY (ROUTINE TESTING W REFLEX): HIV Screen 4th Generation wRfx: NONREACTIVE

## 2020-12-25 MED ORDER — CEFTRIAXONE SODIUM 500 MG IJ SOLR
500.0000 mg | Freq: Once | INTRAMUSCULAR | Status: AC
  Administered 2020-12-25: 500 mg via INTRAMUSCULAR
  Filled 2020-12-25: qty 500

## 2020-12-25 MED ORDER — METRONIDAZOLE 500 MG PO TABS: 500.0000 mg | ORAL_TABLET | Freq: Two times a day (BID) | ORAL | 0 refills | Status: DC

## 2020-12-25 MED ORDER — DOXYCYCLINE HYCLATE 100 MG PO CAPS: 100.0000 mg | ORAL_CAPSULE | Freq: Two times a day (BID) | ORAL | 0 refills | Status: DC

## 2020-12-25 NOTE — Discharge Instructions (Signed)
You were seen in the ER today for vaginal discharge. Your pelvic sample showed findings consistent with trichomoniasis- see attached handout. This is a sexually transmitted infection.   We are sending you home with flagyl (to treat the trichomonas) & doxycyline to also to cover for gonorrhea/chlamydia as well.   Do not drink alcohol when taking flagyl as it can make you extremely sick and have dangerous side effects.   We have prescribed you new medication(s) today. Discuss the medications prescribed today with your pharmacist as they can have adverse effects and interactions with your other medicines including over the counter and prescribed medications. Seek medical evaluation if you start to experience new or abnormal symptoms after taking one of these medicines, seek care immediately if you start to experience difficulty breathing, feeling of your throat closing, facial swelling, or rash as these could be indications of a more serious allergic reaction  Do not have intercourse of any kind until 1 week following the last dose of your antibiotics to avoid re-infection/spreading.   Be sure to inform all sexual partners so that they can seek evaluation and treatment.   We will call you if your syphilis, HIV, gonorrhea, or chlamydia test return positive. You may also see these results on mychart.   Please follow up with your primary care provider within 1 week.  Return to the ER for new or worsening symptoms including but not limited to fever, pelvic pain, vomiting or any other concerns.
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
## 2021-01-12 ENCOUNTER — Other Ambulatory Visit: Payer: Self-pay

## 2021-01-12 ENCOUNTER — Emergency Department (HOSPITAL_COMMUNITY)
Admission: EM | Admit: 2021-01-12 | Discharge: 2021-01-13 | Disposition: A | Discharge status: Home / Self Care | Payer: Medicaid Other | Attending: Emergency Medicine | Admitting: Emergency Medicine

## 2021-01-12 ENCOUNTER — Encounter (HOSPITAL_COMMUNITY): Payer: Self-pay | Admitting: Emergency Medicine

## 2021-01-12 DIAGNOSIS — Y92512 Supermarket, store or market as the place of occurrence of the external cause: Secondary | ICD-10-CM | POA: Insufficient documentation

## 2021-01-12 DIAGNOSIS — Z79899 Other long term (current) drug therapy: Secondary | ICD-10-CM | POA: Insufficient documentation

## 2021-01-12 DIAGNOSIS — Z87891 Personal history of nicotine dependence: Secondary | ICD-10-CM | POA: Insufficient documentation

## 2021-01-12 DIAGNOSIS — I10 Essential (primary) hypertension: Secondary | ICD-10-CM | POA: Insufficient documentation

## 2021-01-12 DIAGNOSIS — Z23 Encounter for immunization: Secondary | ICD-10-CM | POA: Insufficient documentation

## 2021-01-12 DIAGNOSIS — S51852A Open bite of left forearm, initial encounter: Secondary | ICD-10-CM | POA: Insufficient documentation

## 2021-01-12 DIAGNOSIS — W503XXA Accidental bite by another person, initial encounter: Secondary | ICD-10-CM | POA: Insufficient documentation
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
## 2021-01-13 ENCOUNTER — Emergency Department (HOSPITAL_COMMUNITY): Payer: Medicaid Other

## 2021-01-13 MED ORDER — AMOXICILLIN-POT CLAVULANATE 875-125 MG PO TABS
1.0000 | ORAL_TABLET | Freq: Once | ORAL | Status: AC
  Administered 2021-01-13: 1 via ORAL
  Filled 2021-01-13: qty 1

## 2021-01-13 MED ORDER — ACETAMINOPHEN 325 MG PO TABS
650.0000 mg | ORAL_TABLET | Freq: Once | ORAL | Status: AC
  Administered 2021-01-13: 650 mg via ORAL
  Filled 2021-01-13: qty 2

## 2021-01-13 MED ORDER — AMOXICILLIN-POT CLAVULANATE 875-125 MG PO TABS: 1.0000 | ORAL_TABLET | Freq: Two times a day (BID) | ORAL | 0 refills | Status: AC

## 2021-01-13 MED ORDER — IBUPROFEN 800 MG PO TABS
800.0000 mg | ORAL_TABLET | Freq: Once | ORAL | Status: AC
  Administered 2021-01-13: 800 mg via ORAL
  Filled 2021-01-13: qty 1

## 2021-01-13 MED ORDER — TETANUS-DIPHTH-ACELL PERTUSSIS 5-2.5-18.5 LF-MCG/0.5 IM SUSY
0.5000 mL | PREFILLED_SYRINGE | Freq: Once | INTRAMUSCULAR | Status: AC
  Administered 2021-01-13: 0.5 mL via INTRAMUSCULAR
  Filled 2021-01-13: qty 0.5
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
## 2021-03-16 ENCOUNTER — Encounter (HOSPITAL_COMMUNITY): Payer: Self-pay | Admitting: Emergency Medicine

## 2021-03-16 ENCOUNTER — Emergency Department (HOSPITAL_COMMUNITY)
Admission: EM | Admit: 2021-03-16 | Discharge: 2021-03-16 | Disposition: A | Discharge status: Home / Self Care | Payer: Medicaid Other | Attending: Emergency Medicine | Admitting: Emergency Medicine

## 2021-03-16 DIAGNOSIS — N760 Acute vaginitis: Secondary | ICD-10-CM | POA: Insufficient documentation

## 2021-03-16 DIAGNOSIS — Z87891 Personal history of nicotine dependence: Secondary | ICD-10-CM | POA: Insufficient documentation

## 2021-03-16 DIAGNOSIS — Z79899 Other long term (current) drug therapy: Secondary | ICD-10-CM | POA: Insufficient documentation

## 2021-03-16 DIAGNOSIS — B9689 Other specified bacterial agents as the cause of diseases classified elsewhere: Secondary | ICD-10-CM

## 2021-03-16 DIAGNOSIS — I10 Essential (primary) hypertension: Secondary | ICD-10-CM | POA: Insufficient documentation

## 2021-03-16 LAB — WET PREP, GENITAL
Sperm: NONE SEEN
Trich, Wet Prep: NONE SEEN
Yeast Wet Prep HPF POC: NONE SEEN

## 2021-03-16 LAB — URINALYSIS, ROUTINE W REFLEX MICROSCOPIC
Bacteria, UA: NONE SEEN
Bilirubin Urine: NEGATIVE
Glucose, UA: NEGATIVE mg/dL
Ketones, ur: NEGATIVE mg/dL
Leukocytes,Ua: NEGATIVE
Nitrite: NEGATIVE
Protein, ur: 30 mg/dL — AB
RBC / HPF: >50 RBC/hpf — ABNORMAL HIGH (ref 0–5)
Specific Gravity, Urine: 1.025 (ref 1.005–1.030)
pH: 6 (ref 5.0–8.0)

## 2021-03-16 LAB — PREGNANCY, URINE: Preg Test, Ur: NEGATIVE

## 2021-03-16 MED ORDER — METRONIDAZOLE 500 MG PO TABS: 500.0000 mg | ORAL_TABLET | Freq: Two times a day (BID) | ORAL | 0 refills | Status: DC

## 2021-03-16 NOTE — ED Provider Notes (Signed)
[facility] EMERGENCY DEPARTMENT Provider Note   CSN: [id number] Arrival date & time: 03/16/21  1719     History No chief complaint on file.   Jessica Greene is a 35 y.o. female.  Patient presents ER chief complaint of vaginal discharge and itching that has been ongoing for the past 2 months.  She was seen here about 2 months ago diagnosed with trichomonas and given antibiotics.  She says she finished a course of antibiotics but her itchiness never really went away.  She has not sought any other specialist or her primary care doctor.  She states that she just establish insurance care.  Otherwise denies fevers vomiting cough or diarrhea.  Denies abdominal pain.        Past Medical History:  Diagnosis Date  . GERD (gastroesophageal reflux disease)   . Hernia of abdominal cavity   . High cholesterol   . Hypertension   . Polycystic ovarian syndrome   . Uterine polyp 2017    Patient Active Problem List   Diagnosis Date Noted  . Mood disorder (HCC) 04/14/2018  . Uterine polyp 02/28/2018  . Hyperglycemia 02/28/2018  . HTN (hypertension) 02/28/2018  . GERD (gastroesophageal reflux disease) 02/28/2018    Past Surgical History:  Procedure Laterality Date  . ESOPHAGOGASTRODUODENOSCOPY ENDOSCOPY  2013  . POLYPECTOMY  06/29/2016   s/p hysteroscopic polypectomy      OB History    Gravida  2   Para      Term      Preterm      AB  1   Living        SAB  1   IAB      Ectopic      Multiple      Live Births              Family History  Problem Relation Age of Onset  . Diabetes Mother   . Hypertension Mother   . Hyperlipidemia Mother   . Heart disease Mother   . Hypertension Father   . Diabetes Father   . Hyperlipidemia Father   . Heart disease Father   . Stroke Father   . Cancer Father        pancreatic  . Heart disease Sister   . Depression Sister   . Bipolar disorder Sister   . Heart disease Maternal Aunt   . Heart  disease Maternal Uncle   . Heart disease Paternal Aunt   . Heart disease Paternal Uncle   . Heart disease Maternal Grandmother   . Heart disease Maternal Grandfather   . Heart disease Paternal Grandmother   . Heart disease Paternal Grandfather   . Birth defects Paternal Grandfather     Social History   Tobacco Use  . Smoking status: Former Games developer  . Smokeless tobacco: Never Used  Vaping Use  . Vaping Use: Never used  Substance Use Topics  . Alcohol use: Yes    Comment: occasionally  . Drug use: Yes    Frequency: 1.0 times per week    Types: Marijuana    Comment: use of extasy in past, last used 2009    Home Medications Prior to Admission medications   Medication Sig Start Date End Date Taking? Authorizing Provider  metroNIDAZOLE (FLAGYL) 500 MG tablet Take 1 tablet (500 mg total) by mouth 2 (two) times daily. 03/16/21  Yes Clyde Upshaw, Eustace Moore, MD  DM-Phenylephrine-Acetaminophen (VICKS DAYQUIL COLD & FLU) 10-5-325 MG CAPS Take 1 capsule by  mouth every 6 (six) hours as needed (for cough).    [provider]  doxycycline (VIBRAMYCIN) 100 MG capsule Take 1 capsule (100 mg total) by mouth 2 (two) times daily. 12/25/20   Petrucelli, Samantha R, PA-C  ondansetron (ZOFRAN) 4 MG tablet Take 1 tablet (4 mg total) by mouth every 8 (eight) hours as needed for nausea or vomiting. 12/03/19   Mesner, Barbara Cower, MD  Phenyleph-Doxylamine-DM-APAP (NYQUIL SEVERE COLD/FLU) 5-6.25-10-325 MG CAPS Take 1 capsule by mouth at bedtime as needed (for cough).    [provider]  hydrochlorothiazide (MICROZIDE) 12.5 MG capsule Take 1 capsule (12.5 mg total) by mouth daily. Patient not taking: Reported on 12/03/2019 04/13/18 12/03/19  Nche, Bonna Gains, NP  norethindrone (MICRONOR,CAMILA,ERRIN) 0.35 MG tablet Take 1 tablet (0.35 mg total) by mouth daily. Patient not taking: Reported on 12/03/2019 03/21/18 12/03/19  Constant, Peggy, MD    Allergies    Patient has no known allergies.  Review of Systems    Review of Systems  Constitutional: Negative for fever.  HENT: Negative for ear pain.   Eyes: Negative for pain.  Respiratory: Negative for cough.   Cardiovascular: Negative for chest pain.  Gastrointestinal: Negative for abdominal pain.  Genitourinary: Negative for flank pain.  Musculoskeletal: Negative for back pain.  Skin: Negative for rash.  Neurological: Negative for headaches.    Physical Exam Updated Vital Signs BP (!) 144/88 (BP Location: Right Arm)   Pulse 88   Temp 98.8 F (37.1 C) (Oral)   Resp 15   SpO2 99%   Physical Exam Constitutional:      General: She is not in acute distress.    Appearance: Normal appearance.  HENT:     Head: Normocephalic.     Nose: Nose normal.  Eyes:     Extraocular Movements: Extraocular movements intact.  Cardiovascular:     Rate and Rhythm: Normal rate.  Pulmonary:     Effort: Pulmonary effort is normal.  Genitourinary:    General: Normal vulva.     Vagina: No vaginal discharge.     Comments: No gross vaginal discharge noted.  Samples were sent to the lab. Musculoskeletal:        General: Normal range of motion.     Cervical back: Normal range of motion.  Neurological:     General: No focal deficit present.     Mental Status: She is alert. Mental status is at baseline.     ED Results / Procedures / Treatments   Labs (all labs ordered are listed, but only abnormal results are displayed) Labs Reviewed  WET PREP, GENITAL - Abnormal; Notable for the following components:      Result Value   Clue Cells Wet Prep HPF POC PRESENT (*)    WBC, Wet Prep HPF POC MODERATE (*)    All other components within normal limits  URINALYSIS, ROUTINE W REFLEX MICROSCOPIC - Abnormal; Notable for the following components:   APPearance HAZY (*)    Hgb urine dipstick LARGE (*)    Protein, ur 30 (*)    RBC / HPF >50 (*)    All other components within normal limits  PREGNANCY, URINE  GC/CHLAMYDIA PROBE AMP ([place]) NOT AT [facility]     EKG None  Radiology No results found.  Procedures Procedures   Medications Ordered in ED Medications - No data to display  ED Course  I have reviewed the triage vital signs and the nursing notes.  Pertinent labs & imaging results that were  available during my care of the patient were reviewed by me and considered in my medical decision making (see chart for details).    MDM Rules/Calculators/A&P                          Wetness positive for clue cells concerning for bacterial vaginitis.  We will get a given Flagyl to go home with.  Advise follow-up with women's health clinic within the week advised return for fevers worsening pain or any additional concerns.  Patient otherwise states no sexual activity in 4 months,  Final Clinical Impression(s) / ED Diagnoses Final diagnoses:  Bacterial vaginosis    Rx / DC Orders ED Discharge Orders         Ordered    metroNIDAZOLE (FLAGYL) 500 MG tablet  2 times daily        03/16/21 2053           Cheryll Cockayne, MD 03/16/21 2053

## 2021-03-16 NOTE — ED Triage Notes (Signed)
Pt reports ongoing vaginal discharge and itching since she was seen in ED 2/1 for trichomoniasis. States she took her medication with orange juice and she believes that caused it not to work.

## 2021-03-16 NOTE — ED Notes (Signed)
Pt d/c by  MD and is provided w/ d.c instructions and follow up care, pt out of ED ambulatory  

## 2021-03-16 NOTE — ED Triage Notes (Signed)
Emergency Medicine Provider Triage Evaluation Note  Jessica Greene , a 35 y.o. female  was evaluated in triage.  Pt complains of vaginal discharge and itching that has been persistent since being evaluated in the ED on 2/1. She was treated for trichomoniasis; however believes the orange juice made the medication ineffective given her persistent symptoms. No fever or chills. No abdominal pain.  Review of Systems  Positive: Vaginal discharge Negative: Abdominal pain  Physical Exam  BP (!) 155/106 (BP Location: Right Arm)   Pulse 97   Temp 98.7 F (37.1 C) (Oral)   Resp 18   SpO2 95%  Gen:   Awake, no distress   HEENT:  Atraumatic  Resp:  Normal effort  Cardiac:  Normal rate  Abd:   Nondistended, nontender  MSK:   Moves extremities without difficulty  Neuro:  Speech clear   Medical Decision Making  Medically screening exam initiated at 6:41 PM.  Appropriate orders placed.  Jessica Greene was informed that the remainder of the evaluation will be completed by another provider, this initial triage assessment does not replace that evaluation, and the importance of remaining in the ED until their evaluation is complete.  Clinical Impression  Vaginal discharged and itching.    Jessica Greene, [place] 03/16/21 1845

## 2021-03-16 NOTE — Discharge Instructions (Signed)
Call your primary care doctor or specialist as discussed in the next 2-3 days.   Return immediately back to the ER if:  Your symptoms worsen within the next 12-24 hours. You develop new symptoms such as new fevers, persistent vomiting, new pain, shortness of breath, or new weakness or numbness, or if you have any other concerns.  

## 2021-03-17 LAB — GC/CHLAMYDIA PROBE AMP (~~LOC~~) NOT AT ARMC
Chlamydia: NEGATIVE
Comment: NEGATIVE
Comment: NORMAL
Neisseria Gonorrhea: NEGATIVE

## 2021-03-26 ENCOUNTER — Ambulatory Visit: Payer: Self-pay | Admitting: Obstetrics and Gynecology

## 2021-04-03 ENCOUNTER — Ambulatory Visit: Payer: Self-pay | Admitting: Obstetrics and Gynecology

## 2021-04-15 ENCOUNTER — Encounter: Payer: Self-pay | Admitting: Obstetrics and Gynecology

## 2021-04-15 ENCOUNTER — Other Ambulatory Visit (HOSPITAL_COMMUNITY)
Admission: RE | Admit: 2021-04-15 | Discharge: 2021-04-15 | Disposition: A | Discharge status: Home / Self Care | Payer: BC Managed Care – PPO | Source: Ambulatory Visit | Attending: Obstetrics and Gynecology | Admitting: Obstetrics and Gynecology

## 2021-04-15 ENCOUNTER — Other Ambulatory Visit: Payer: Self-pay

## 2021-04-15 ENCOUNTER — Ambulatory Visit (INDEPENDENT_AMBULATORY_CARE_PROVIDER_SITE_OTHER): Payer: BC Managed Care – PPO | Admitting: Obstetrics and Gynecology

## 2021-04-15 VITALS — BP 145/97 | HR 89 | Wt 262.5 lb

## 2021-04-15 DIAGNOSIS — Z30011 Encounter for initial prescription of contraceptive pills: Secondary | ICD-10-CM

## 2021-04-15 DIAGNOSIS — Z01419 Encounter for gynecological examination (general) (routine) without abnormal findings: Secondary | ICD-10-CM | POA: Diagnosis not present

## 2021-04-15 DIAGNOSIS — N938 Other specified abnormal uterine and vaginal bleeding: Secondary | ICD-10-CM | POA: Diagnosis not present

## 2021-04-15 DIAGNOSIS — E669 Obesity, unspecified: Secondary | ICD-10-CM

## 2021-04-15 DIAGNOSIS — Z113 Encounter for screening for infections with a predominantly sexual mode of transmission: Secondary | ICD-10-CM | POA: Diagnosis not present

## 2021-04-15 MED ORDER — NORETHINDRONE 0.35 MG PO TABS: 1.0000 | ORAL_TABLET | Freq: Every day | ORAL | 11 refills | Status: DC

## 2021-04-15 NOTE — Progress Notes (Signed)
Reports bleeding almost every day. Reports using 3 overnight pads per day. Reports feeling tired. Reports unable to give plasma due to "low iron". Reports history of "blood in my urine" every time it is tested and back pain.

## 2021-04-15 NOTE — Progress Notes (Signed)
Subjective:     Jessica Greene is a 35 y.o. female P0 with BMI 39 who is here for a comprehensive physical exam. The patient reports persistent DUB related to PCOS. She describes the onset of a normal period monthly followed by several days of vaginal spotting. She denies pelvic pain. She is sexually active. Patient never started previously prescribed POP for cycle control. She is ready for medical management at this time. She is without any other complaints  Past Medical History:  Diagnosis Date  . GERD (gastroesophageal reflux disease)   . Hernia of abdominal cavity   . High cholesterol   . Hypertension   . Polycystic ovarian syndrome   . Uterine polyp 2017   Past Surgical History:  Procedure Laterality Date  . ESOPHAGOGASTRODUODENOSCOPY ENDOSCOPY  2013  . POLYPECTOMY  06/29/2016   s/p hysteroscopic polypectomy    Family History  Problem Relation Age of Onset  . Diabetes Mother   . Hypertension Mother   . Hyperlipidemia Mother   . Heart disease Mother   . Hypertension Father   . Diabetes Father   . Hyperlipidemia Father   . Heart disease Father   . Stroke Father   . Cancer Father        pancreatic  . Heart disease Sister   . Depression Sister   . Bipolar disorder Sister   . Heart disease Maternal Aunt   . Heart disease Maternal Uncle   . Heart disease Paternal Aunt   . Heart disease Paternal Uncle   . Heart disease Maternal Grandmother   . Heart disease Maternal Grandfather   . Heart disease Paternal Grandmother   . Heart disease Paternal Grandfather   . Birth defects Paternal Grandfather     Social History   Socioeconomic History  . Marital status: Married    Spouse name: Not on file  . Number of children: Not on file  . Years of education: Not on file  . Highest education level: Not on file  Occupational History  . Not on file  Tobacco Use  . Smoking status: Former Games developer  . Smokeless tobacco: Never Used  Vaping Use  . Vaping Use: Never used   Substance and Sexual Activity  . Alcohol use: Yes    Comment: occasionally  . Drug use: Yes    Frequency: 1.0 times per week    Types: Marijuana    Comment: use of extasy in past, last used 2009  . Sexual activity: Yes    Partners: Male    Birth control/protection: None  Other Topics Concern  . Not on file  Social History Narrative  . Not on file   Social Determinants of Health   Financial Resource Strain: Not on file  Food Insecurity: Not on file  Transportation Needs: Not on file  Physical Activity: Not on file  Stress: Not on file  Social Connections: Not on file  Intimate Partner Violence: Not on file   Health Maintenance  Topic Date Due  . COVID-19 Vaccine (1) Never done  . PAP SMEAR-Modifier  03/21/2021  . INFLUENZA VACCINE  06/23/2021  . TETANUS/TDAP  01/13/2031  . Hepatitis C Screening  Completed  . HIV Screening  Completed  . HPV VACCINES  Aged Out       Review of Systems Pertinent items noted in HPI and remainder of comprehensive ROS otherwise negative.   Objective:  Blood pressure (!) 145/97, pulse 89, weight 262 lb 8 oz (119.1 kg), unknown if currently breastfeeding. GENERAL: Well-developed,  well-nourished female in no acute distress.  HEENT: Normocephalic, atraumatic. Sclerae anicteric.  NECK: Supple. Normal thyroid.  LUNGS: Clear to auscultation bilaterally.  HEART: Regular rate and rhythm. BREASTS: Symmetric in size. No palpable masses or lymphadenopathy, skin changes, or nipple drainage. ABDOMEN: Soft, nontender, nondistended. No organomegaly. PELVIC: Normal external female genitalia. Vagina is pink and rugated.  Normal discharge. Normal appearing cervix. Bimanual exam limited secondary to body habitus. No adnexal mass or tenderness. EXTREMITIES: No cyanosis, clubbing, or edema, 2+ distal pulses.       Assessment:    Healthy female exam.      Plan:    Pap smear collected STI screening collected per patient request Vaginal swab collected  to rule out cervicitis Pelvic ultrasound ordered Rx POP provided Patient referred to nutritionist to help with weight loss Patient will be contacted with abnormal results See After Visit Summary for Counseling Recommendations

## 2021-04-16 LAB — CERVICOVAGINAL ANCILLARY ONLY
Bacterial Vaginitis (gardnerella): NEGATIVE
Candida Glabrata: NEGATIVE
Candida Vaginitis: NEGATIVE
Chlamydia: NEGATIVE
Comment: NEGATIVE
Comment: NEGATIVE
Comment: NEGATIVE
Comment: NEGATIVE
Comment: NEGATIVE
Comment: NORMAL
Neisseria Gonorrhea: NEGATIVE
Trichomonas: NEGATIVE

## 2021-04-16 LAB — HIV ANTIBODY (ROUTINE TESTING W REFLEX): HIV Screen 4th Generation wRfx: NONREACTIVE

## 2021-04-16 LAB — RPR: RPR Ser Ql: NONREACTIVE

## 2021-04-16 LAB — HEPATITIS B SURFACE ANTIGEN: Hepatitis B Surface Ag: NEGATIVE

## 2021-04-16 LAB — HEPATITIS C ANTIBODY: Hep C Virus Ab: <0.1 s/co ratio (ref 0.0–0.9)

## 2021-04-17 LAB — CYTOLOGY - PAP
Comment: NEGATIVE
Diagnosis: NEGATIVE
High risk HPV: NEGATIVE

## 2021-04-22 ENCOUNTER — Ambulatory Visit (HOSPITAL_BASED_OUTPATIENT_CLINIC_OR_DEPARTMENT_OTHER): Payer: BC Managed Care – PPO

## 2021-04-30 ENCOUNTER — Encounter: Payer: Self-pay | Admitting: Nurse Practitioner

## 2021-04-30 ENCOUNTER — Ambulatory Visit (INDEPENDENT_AMBULATORY_CARE_PROVIDER_SITE_OTHER): Payer: BC Managed Care – PPO | Admitting: Nurse Practitioner

## 2021-04-30 ENCOUNTER — Other Ambulatory Visit: Payer: Self-pay

## 2021-04-30 VITALS — BP 138/86 | HR 80 | Temp 98.3°F | Ht 68.0 in | Wt 266.6 lb

## 2021-04-30 DIAGNOSIS — Z6841 Body Mass Index (BMI) 40.0 and over, adult: Secondary | ICD-10-CM

## 2021-04-30 DIAGNOSIS — Z13220 Encounter for screening for lipoid disorders: Secondary | ICD-10-CM | POA: Diagnosis not present

## 2021-04-30 DIAGNOSIS — Z0001 Encounter for general adult medical examination with abnormal findings: Secondary | ICD-10-CM

## 2021-04-30 DIAGNOSIS — I10 Essential (primary) hypertension: Secondary | ICD-10-CM | POA: Diagnosis not present

## 2021-04-30 DIAGNOSIS — Z136 Encounter for screening for cardiovascular disorders: Secondary | ICD-10-CM | POA: Diagnosis not present

## 2021-04-30 DIAGNOSIS — R739 Hyperglycemia, unspecified: Secondary | ICD-10-CM | POA: Diagnosis not present

## 2021-04-30 DIAGNOSIS — F332 Major depressive disorder, recurrent severe without psychotic features: Secondary | ICD-10-CM

## 2021-04-30 DIAGNOSIS — E669 Obesity, unspecified: Secondary | ICD-10-CM | POA: Insufficient documentation

## 2021-04-30 MED ORDER — DULOXETINE HCL 20 MG PO CPEP: 20.0000 mg | ORAL_CAPSULE | Freq: Every day | ORAL | 5 refills | Status: DC

## 2021-04-30 MED ORDER — HYDROCHLOROTHIAZIDE 12.5 MG PO CAPS: 12.5000 mg | ORAL_CAPSULE | Freq: Every day | ORAL | 1 refills | Status: DC

## 2021-04-30 NOTE — Progress Notes (Signed)
Subjective:    Patient ID: Jessica Greene, female    DOB: 20-Dec-1985, 35 y.o.   MRN: [id number]  Patient presents today for CPE and eval of chronic conditions  HPI HTN (hypertension) BP at goal BP Readings from Last 3 Encounters:  04/30/21 138/86  04/15/21 (!) 145/97  03/16/21 (!) 142/93  sent HCTZ refill  Mood disorder (HCC) Waxing and waning Needs another referral to psychologist. No SI or hallucination No Hx of suicide attempt or IVC Denies any substance abuse Hx of migraines and GERD: avoid wellbutrin Struggles with weight gain: avoid SSRI and TCA agreed to start cymbalta Advised of possible side effects Entered psychology referral F/up in 21month  Class 3 severe obesity due to excess calories with serious comorbidity and body mass index (BMI) of 40.0 to 44.9 in adult [facility]) Was able to loss 20lbs last year with dietary changes and daily exercise. This was interrupted by worsening mood which led to increased appetite and loss of interest. Will avoid SSRI and TCA encourage to resume life style modifications   Sexual History (orientation,birth control, marital status, STD):sexually active, denies need for STD screen, breast and pelvic exam completed by GYN  Depression/Suicide: Depression screen [facility] 2/9 04/30/2021 04/13/2018 02/23/2018  Decreased Interest 3 2 2   Down, Depressed, Hopeless 3 2 2   PHQ - 2 Score 6 4 4   Altered sleeping 3 0 -  Tired, decreased energy 3 2 -  Change in appetite 0 2 -  Feeling bad or failure about yourself  1 2 -  Trouble concentrating 0 2 -  Moving slowly or fidgety/restless 0 2 -  Suicidal thoughts 1 0 -  PHQ-9 Score 14 14 -  Difficult doing work/chores Very difficult Somewhat difficult -   GAD 7 : Generalized Anxiety Score 04/30/2021  Nervous, Anxious, on Edge 1  Control/stop worrying 1  Worry too much - different things 1  Trouble relaxing 1  Restless 1  Easily annoyed or irritable 2  Afraid - awful might happen 1  Total GAD 7  Score 8  Anxiety Difficulty Somewhat difficult   Vision:will schedule  Dental:will schedule  Immunizations: (TDAP, Hep C screen, Pneumovax, Influenza, zoster)  Health Maintenance  Topic Date Due  . COVID-19 Vaccine (1) Never done  . Flu Shot  06/23/2021  . Pap Smear  04/15/2024  . Tetanus Vaccine  01/13/2031  . Zoster (Shingles) Vaccine (1 of 2) 05/27/2035  . Hepatitis C Screening: USPSTF Recommendation to screen - Ages 90-79 yo.  Completed  . HIV Screening  Completed  . Pneumococcal Vaccination  Aged Out  . HPV Vaccine  Aged Out   Diet:regular Exercise: none Weight:  Wt Readings from Last 3 Encounters:  04/30/21 266 lb 9.6 oz (120.9 kg)  04/15/21 262 lb 8 oz (119.1 kg)  01/12/21 259 lb 14.8 oz (117.9 kg)   Fall Risk: Fall Risk  04/30/2021 04/13/2018 02/23/2018  Falls in the past year? 0 No No  Number falls in past yr: 0 - -  Injury with Fall? 0 - -  Risk for fall due to : No Fall Risks - -  Follow up Falls evaluation completed - -   Medications and allergies reviewed with patient and updated if appropriate.  Patient Active Problem List   Diagnosis Date Noted  . Class 3 severe obesity due to excess calories with serious comorbidity and body mass index (BMI) of 40.0 to 44.9 in adult (HCC) 04/30/2021  . Mood disorder (HCC) 04/14/2018  . Uterine polyp 02/28/2018  .  Hyperglycemia 02/28/2018  . HTN (hypertension) 02/28/2018  . GERD (gastroesophageal reflux disease) 02/28/2018    No current outpatient medications on file prior to visit.   No current facility-administered medications on file prior to visit.    Past Medical History:  Diagnosis Date  . GERD (gastroesophageal reflux disease)   . Hernia of abdominal cavity   . High cholesterol   . Hypertension   . Polycystic ovarian syndrome   . Uterine polyp 2017    Past Surgical History:  Procedure Laterality Date  . ESOPHAGOGASTRODUODENOSCOPY ENDOSCOPY  2013  . POLYPECTOMY  06/29/2016   s/p hysteroscopic  polypectomy     Social History   Socioeconomic History  . Marital status: Married    Spouse name: Not on file  . Number of children: Not on file  . Years of education: Not on file  . Highest education level: Not on file  Occupational History  . Not on file  Tobacco Use  . Smoking status: Former Games developer  . Smokeless tobacco: Never Used  Vaping Use  . Vaping Use: Never used  Substance and Sexual Activity  . Alcohol use: Yes    Comment: occasionally  . Drug use: Yes    Frequency: 1.0 times per week    Types: Marijuana    Comment: use of extasy in past, last used 2009  . Sexual activity: Yes    Partners: Male    Birth control/protection: None  Other Topics Concern  . Not on file  Social History Narrative  . Not on file   Social Determinants of Health   Financial Resource Strain: Not on file  Food Insecurity: Not on file  Transportation Needs: Not on file  Physical Activity: Not on file  Stress: Not on file  Social Connections: Not on file   Family History  Problem Relation Age of Onset  . Diabetes Mother   . Hypertension Mother   . Hyperlipidemia Mother   . Heart disease Mother   . Hypertension Father   . Diabetes Father   . Hyperlipidemia Father   . Heart disease Father   . Stroke Father   . Cancer Father        pancreatic  . Heart disease Sister   . Depression Sister   . Bipolar disorder Sister   . Heart disease Maternal Aunt   . Heart disease Maternal Uncle   . Heart disease Paternal Aunt   . Heart disease Paternal Uncle   . Heart disease Maternal Grandmother   . Heart disease Maternal Grandfather   . Heart disease Paternal Grandmother   . Heart disease Paternal Grandfather   . Birth defects Paternal Grandfather        Review of Systems  Constitutional: Negative for fever, malaise/fatigue and weight loss.  HENT: Negative for congestion and sore throat.   Eyes:       Negative for visual changes  Respiratory: Negative for cough and shortness of  breath.   Cardiovascular: Negative for chest pain, palpitations and leg swelling.  Gastrointestinal: Negative for blood in stool, constipation, diarrhea and heartburn.  Genitourinary: Negative for dysuria, frequency and urgency.  Musculoskeletal: Negative for falls, joint pain and myalgias.  Skin: Negative for rash.  Neurological: Negative for dizziness, sensory change and headaches.  Endo/Heme/Allergies: Does not bruise/bleed easily.  Psychiatric/Behavioral: Negative for depression, substance abuse and suicidal ideas. The patient is not nervous/anxious.     Objective:   Vitals:   04/30/21 1317  BP: 138/86  Pulse: 80  Temp:  98.3 F (36.8 C)  SpO2: 98%   Body mass index is 40.54 kg/m.  Physical Examination:  Physical Exam Vitals reviewed.  Constitutional:      General: She is not in acute distress.    Appearance: She is well-developed. She is obese.  HENT:     Right Ear: Tympanic membrane, ear canal and external ear normal.     Left Ear: Tympanic membrane, ear canal and external ear normal.  Eyes:     Extraocular Movements: Extraocular movements intact.     Conjunctiva/sclera: Conjunctivae normal.  Cardiovascular:     Rate and Rhythm: Normal rate and regular rhythm.     Pulses: Normal pulses.     Heart sounds: Normal heart sounds.  Pulmonary:     Effort: Pulmonary effort is normal. No respiratory distress.     Breath sounds: Normal breath sounds.  Chest:     Chest wall: No tenderness.  Abdominal:     General: Bowel sounds are normal.     Palpations: Abdomen is soft.  Genitourinary:    Comments: Deferred breast and pelvic exam to GYN Musculoskeletal:        General: Normal range of motion.     Cervical back: Normal range of motion and neck supple.     Right lower leg: No edema.     Left lower leg: No edema.  Skin:    General: Skin is warm and dry.  Neurological:     Mental Status: She is alert and oriented to person, place, and time.     Deep Tendon Reflexes:  Reflexes are normal and symmetric.  Psychiatric:        Mood and Affect: Mood normal.        Behavior: Behavior normal.        Thought Content: Thought content normal.    ASSESSMENT and PLAN: This visit occurred during the SARS-CoV-2 public health emergency.  Safety protocols were in place, including screening questions prior to the visit, additional usage of staff PPE, and extensive cleaning of exam room while observing appropriate contact time as indicated for disinfecting solutions.   Angellynn was seen today for annual exam.  Diagnoses and all orders for this visit:  Encounter for preventative adult health care exam with abnormal findings -     CBC with Differential/Platelet; Future -     Comprehensive metabolic panel; Future -     Lipid panel; Future -     TSH; Future  Primary hypertension -     hydrochlorothiazide (MICROZIDE) 12.5 MG capsule; Take 1 capsule (12.5 mg total) by mouth daily.  Hyperglycemia -     Hemoglobin A1c; Future  Severe episode of recurrent major depressive disorder, without psychotic features (HCC) -     DULoxetine (CYMBALTA) 20 MG capsule; Take 1 capsule (20 mg total) by mouth daily. -     Ambulatory referral to Psychology  Encounter for lipid screening for cardiovascular disease -     Lipid panel; Future  Class 3 severe obesity due to excess calories with serious comorbidity and body mass index (BMI) of 40.0 to 44.9 in adult [facility])      Problem List Items Addressed This Visit      Cardiovascular and Mediastinum   HTN (hypertension)    BP at goal BP Readings from Last 3 Encounters:  04/30/21 138/86  04/15/21 (!) 145/97  03/16/21 (!) 142/93  sent HCTZ refill      Relevant Medications   hydrochlorothiazide (MICROZIDE) 12.5 MG capsule  Other   Class 3 severe obesity due to excess calories with serious comorbidity and body mass index (BMI) of 40.0 to 44.9 in adult (HCC)    [facility] to loss 20lbs last year with dietary changes and daily  exercise. This was interrupted by worsening mood which led to increased appetite and loss of interest. Will avoid SSRI and TCA encourage to resume life style modifications       Hyperglycemia   Relevant Orders   Hemoglobin A1c   Mood disorder (HCC)    Waxing and waning Needs another referral to psychologist. No SI or hallucination No Hx of suicide attempt or IVC Denies any substance abuse Hx of migraines and GERD: avoid wellbutrin Struggles with weight gain: avoid SSRI and TCA agreed to start cymbalta Advised of possible side effects Entered psychology referral F/up in 83month      Relevant Medications   DULoxetine (CYMBALTA) 20 MG capsule    Other Visit Diagnoses    Encounter for preventative adult health care exam with abnormal findings    -  Primary   Relevant Orders   CBC with Differential/Platelet   Comprehensive metabolic panel   Lipid panel   TSH   Encounter for lipid screening for cardiovascular disease       Relevant Orders   Lipid panel      Follow up: Return in about 2 months (around 06/30/2021) for Anxiety and depression (30mins).  Alysia Pennaharlotte Geneal Huebert, NP

## 2021-04-30 NOTE — Assessment & Plan Note (Signed)
BP at goal BP Readings from Last 3 Encounters:  04/30/21 138/86  04/15/21 (!) 145/97  03/16/21 (!) 142/93  sent HCTZ refill

## 2021-04-30 NOTE — Assessment & Plan Note (Addendum)
Waxing and waning Needs another referral to psychologist. No SI or hallucination No Hx of suicide attempt or IVC Denies any substance abuse Hx of migraines and GERD: avoid wellbutrin Struggles with weight gain: avoid SSRI and TCA agreed to start cymbalta Advised of possible side effects Entered psychology referral F/up in 80month

## 2021-04-30 NOTE — Patient Instructions (Addendum)
Resume HCTZ for hypertension Start cymbalta for depression You will be contacted to schedule appt with psychologist  Schedule lab appt for fasting blood draw Will order holter monitor if normal lab results   http://APA.org/depression-guideline"> https://clinicalkey.com"> http://point-of-care.elsevierperformancemanager.com/skills/"> http://point-of-care.elsevierperformancemanager.com">  Managing Depression, Adult Depression is a mental health condition that affects your thoughts, feelings, and actions. Being diagnosed with depression can bring you relief if you did not know why you have felt or behaved a certain way. It could also leave you feeling overwhelmed with uncertainty about your future. Preparing yourself to manage your symptoms can help you feel more positive about your future. How to manage lifestyle changes Managing stress Stress is your body's reaction to life changes and events, both good and bad. Stress can add to your feelings of depression. Learning to manage your stress can help lessen your feelings of depression. Try some of the following approaches to reducing your stress (stress reduction techniques):  Listen to music that you enjoy and that inspires you.  Try using a meditation app or take a meditation class.  Develop a practice that helps you connect with your spiritual self. Walk in nature, pray, or go to a place of worship.  Do some deep breathing. To do this, inhale slowly through your nose. Pause at the top of your inhale for a few seconds and then exhale slowly, letting your muscles relax.  Practice yoga to help relax and work your muscles. Choose a stress reduction technique that suits your lifestyle and personality. These techniques take time and practice to develop. Set aside 5-15 minutes a day to do them. Therapists can offer training in these techniques. Other things you can do to manage stress include:  Keeping a stress diary.  Knowing your limits and  saying no when you think something is too much.  Paying attention to how you react to certain situations. You may not be able to control everything, but you can change your reaction.  Adding humor to your life by watching funny films or TV shows.  Making time for activities that you enjoy and that relax you.   Medicines Medicines, such as antidepressants, are often a part of treatment for depression.  Talk with your pharmacist or health care provider about all the medicines, supplements, and herbal products that you take, their possible side effects, and what medicines and other products are safe to take together.  Make sure to report any side effects you may have to your health care provider. Relationships Your health care provider may suggest family therapy, couples therapy, or individual therapy as part of your treatment. How to recognize changes Everyone responds differently to treatment for depression. As you recover from depression, you may start to:  Have more interest in doing activities.  Feel less hopeless.  Have more energy.  Overeat less often, or have a better appetite.  Have better mental focus. It is important to recognize if your depression is not getting better or is getting worse. The symptoms you had in the beginning may return, such as:  Tiredness (fatigue) or low energy.  Eating too much or too little.  Sleeping too much or too little.  Feeling restless, agitated, or hopeless.  Trouble focusing or making decisions.  Unexplained physical complaints.  Feeling irritable, angry, or aggressive. If you or your family members notice these symptoms coming back, let your health care provider know right away. Follow these instructions at home: Activity  Try to get some form of exercise each day, such as walking,  biking, swimming, or lifting weights.  Practice stress reduction techniques.  Engage your mind by taking a class or doing some volunteer work.    Lifestyle  Get the right amount and quality of sleep.  Cut down on using caffeine, tobacco, alcohol, and other potentially harmful substances.  Eat a healthy diet that includes plenty of vegetables, fruits, whole grains, low-fat dairy products, and lean protein. Do not eat a lot of foods that are high in solid fats, added sugars, or salt (sodium). General instructions  Take over-the-counter and prescription medicines only as told by your health care provider.  Keep all follow-up visits as told by your health care provider. This is important. Where to find support Talking to others Friends and family members can be sources of support and guidance. Talk to trusted friends or family members about your condition. Explain your symptoms to them, and let them know that you are working with a health care provider to treat your depression. Tell friends and family members how they also can be helpful.   Finances  Find appropriate mental health providers that fit with your financial situation.  Talk with your health care provider about options to get reduced prices on your medicines. Where to find more information You can find support in your area from:  Anxiety and Depression Association of America (ADAA): www.adaa.org  Mental Health America: www.mentalhealthamerica.net  [employer] on Mental Illness: www.nami.org Contact a health care provider if:  You stop taking your antidepressant medicines, and you have any of these symptoms: ? Nausea. ? Headache. ? Light-headedness. ? Chills and body aches. ? Not being able to sleep (insomnia).  You or your friends and family think your depression is getting worse. Get help right away if:  You have thoughts of hurting yourself or others. If you ever feel like you may hurt yourself or others, or have thoughts about taking your own life, get help right away. Go to your nearest emergency department or:  Call your local emergency services  (911 in the [place]  Call a suicide crisis helpline, such as the National Suicide Prevention Lifeline at 316-838-6370. This is open 24 hours a day in the [place]  Text the Crisis Text Line at (331)154-0144 (in the [place] Summary  If you are diagnosed with depression, preparing yourself to manage your symptoms is a good way to feel positive about your future.  Work with your health care provider on a management plan that includes stress reduction techniques, medicines (if applicable), therapy, and healthy lifestyle habits.  Keep talking with your health care provider about how your treatment is working.  If you have thoughts about taking your own life, call a suicide crisis helpline or text a crisis text line. This information is not intended to replace advice given to you by your health care provider. Make sure you discuss any questions you have with your health care provider. Document Revised: 09/20/2019 Document Reviewed: 09/20/2019 Elsevier Patient Education  2021 [employer].

## 2021-04-30 NOTE — Assessment & Plan Note (Signed)
Was able to loss 20lbs last year with dietary changes and daily exercise. This was interrupted by worsening mood which led to increased appetite and loss of interest. Will avoid SSRI and TCA encourage to resume life style modifications

## 2021-05-05 ENCOUNTER — Other Ambulatory Visit: Payer: BC Managed Care – PPO

## 2021-05-06 ENCOUNTER — Other Ambulatory Visit: Payer: BC Managed Care – PPO

## 2021-05-06 ENCOUNTER — Ambulatory Visit (HOSPITAL_BASED_OUTPATIENT_CLINIC_OR_DEPARTMENT_OTHER): Admission: RE | Admit: 2021-05-06 | Payer: BC Managed Care – PPO | Source: Ambulatory Visit

## 2021-05-12 ENCOUNTER — Other Ambulatory Visit: Payer: Self-pay

## 2021-05-12 ENCOUNTER — Other Ambulatory Visit (INDEPENDENT_AMBULATORY_CARE_PROVIDER_SITE_OTHER): Payer: BC Managed Care – PPO

## 2021-05-12 DIAGNOSIS — Z0001 Encounter for general adult medical examination with abnormal findings: Secondary | ICD-10-CM | POA: Diagnosis not present

## 2021-05-12 DIAGNOSIS — Z13220 Encounter for screening for lipoid disorders: Secondary | ICD-10-CM | POA: Diagnosis not present

## 2021-05-12 DIAGNOSIS — E782 Mixed hyperlipidemia: Secondary | ICD-10-CM

## 2021-05-12 DIAGNOSIS — R739 Hyperglycemia, unspecified: Secondary | ICD-10-CM | POA: Diagnosis not present

## 2021-05-12 DIAGNOSIS — Z136 Encounter for screening for cardiovascular disorders: Secondary | ICD-10-CM | POA: Diagnosis not present

## 2021-05-12 LAB — HEMOGLOBIN A1C: Hgb A1c MFr Bld: 6.1 % (ref 4.6–6.5)

## 2021-05-12 LAB — COMPREHENSIVE METABOLIC PANEL
ALT: 16 U/L (ref 0–35)
AST: 15 U/L (ref 0–37)
Albumin: 4.3 g/dL (ref 3.5–5.2)
Alkaline Phosphatase: 78 U/L (ref 39–117)
BUN: 8 mg/dL (ref 6–23)
CO2: 29 mEq/L (ref 19–32)
Calcium: 9.5 mg/dL (ref 8.4–10.5)
Chloride: 101 mEq/L (ref 96–112)
Creatinine, Ser: 0.56 mg/dL (ref 0.40–1.20)
GFR: 117.79 mL/min (ref >60.00)
Glucose, Bld: 101 mg/dL — ABNORMAL HIGH (ref 70–99)
Potassium: 3.4 mEq/L — ABNORMAL LOW (ref 3.5–5.1)
Sodium: 138 mEq/L (ref 135–145)
Total Bilirubin: 0.5 mg/dL (ref 0.2–1.2)
Total Protein: 7.2 g/dL (ref 6.0–8.3)

## 2021-05-12 LAB — CBC WITH DIFFERENTIAL/PLATELET
Basophils Absolute: 0 10*3/uL (ref 0.0–0.1)
Basophils Relative: 0.6 % (ref 0.0–3.0)
Eosinophils Absolute: 0.2 10*3/uL (ref 0.0–0.7)
Eosinophils Relative: 3.1 % (ref 0.0–5.0)
HCT: 39.8 % (ref 36.0–46.0)
Hemoglobin: 13.3 g/dL (ref 12.0–15.0)
Lymphocytes Relative: 54.4 % — ABNORMAL HIGH (ref 12.0–46.0)
Lymphs Abs: 3 10*3/uL (ref 0.7–4.0)
MCHC: 33.4 g/dL (ref 30.0–36.0)
MCV: 93.2 fl (ref 78.0–100.0)
Monocytes Absolute: 0.5 10*3/uL (ref 0.1–1.0)
Monocytes Relative: 8.9 % (ref 3.0–12.0)
Neutro Abs: 1.8 10*3/uL (ref 1.4–7.7)
Neutrophils Relative %: 33 % — ABNORMAL LOW (ref 43.0–77.0)
Platelets: 229 10*3/uL (ref 150.0–400.0)
RBC: 4.27 Mil/uL (ref 3.87–5.11)
RDW: 13.4 % (ref 11.5–15.5)
WBC: 5.6 10*3/uL (ref 4.0–10.5)

## 2021-05-12 LAB — TSH: TSH: 1.28 u[IU]/mL (ref 0.35–4.50)

## 2021-05-12 LAB — LIPID PANEL
Cholesterol: 231 mg/dL — ABNORMAL HIGH (ref 0–200)
HDL: 31.2 mg/dL — ABNORMAL LOW (ref >39.00)
NonHDL: 200.07
Total CHOL/HDL Ratio: 7
Triglycerides: 215 mg/dL — ABNORMAL HIGH (ref 0.0–149.0)
VLDL: 43 mg/dL — ABNORMAL HIGH (ref 0.0–40.0)

## 2021-05-12 LAB — LDL CHOLESTEROL, DIRECT: Direct LDL: 150 mg/dL

## 2021-05-12 NOTE — Progress Notes (Signed)
Per orders of NP Charlotte Nche pt is here for labs, pt tolerated draw well.  

## 2021-05-13 DIAGNOSIS — E782 Mixed hyperlipidemia: Secondary | ICD-10-CM | POA: Insufficient documentation

## 2021-05-13 NOTE — Addendum Note (Signed)
Addended by: Michaela Corner on: 05/13/2021 04:43 PM   Modules accepted: Orders

## 2021-05-15 ENCOUNTER — Other Ambulatory Visit: Payer: BC Managed Care – PPO

## 2021-07-04 ENCOUNTER — Ambulatory Visit: Payer: BC Managed Care – PPO | Admitting: Nurse Practitioner

## 2021-07-15 ENCOUNTER — Encounter: Payer: Self-pay | Admitting: Nurse Practitioner

## 2021-07-15 ENCOUNTER — Ambulatory Visit (INDEPENDENT_AMBULATORY_CARE_PROVIDER_SITE_OTHER): Payer: BC Managed Care – PPO | Admitting: Nurse Practitioner

## 2021-07-15 ENCOUNTER — Other Ambulatory Visit: Payer: Self-pay

## 2021-07-15 VITALS — BP 124/86 | HR 95 | Temp 97.8°F | Ht 68.0 in | Wt 261.8 lb

## 2021-07-15 DIAGNOSIS — F5102 Adjustment insomnia: Secondary | ICD-10-CM | POA: Insufficient documentation

## 2021-07-15 DIAGNOSIS — E876 Hypokalemia: Secondary | ICD-10-CM

## 2021-07-15 DIAGNOSIS — F39 Unspecified mood [affective] disorder: Secondary | ICD-10-CM | POA: Diagnosis not present

## 2021-07-15 MED ORDER — POTASSIUM CHLORIDE CRYS ER 20 MEQ PO TBCR: 20.0000 meq | EXTENDED_RELEASE_TABLET | Freq: Every day | ORAL | 1 refills | Status: DC

## 2021-07-15 NOTE — Progress Notes (Signed)
Subjective:  Patient ID: Jessica Greene, female    DOB: 05-28-1986  Age: 35 y.o. MRN: [id number]  CC: Follow-up (2 month f/u on anxiety and depression. Pt states she stopped taking medication because it made her throw up and she doesn't like to be sick.)  HPI  Mood disorder (HCC) Stable mood with persistent difficulty staying alseep, no SI/HI Reports nausea with 1dose of cymbalta 82month ago. She has not taken med since then. She has not made appt with therapist.  Provided number to schedule appt with therapist. Advised to resume cymbalta and take with food F/up in 38month  Adjustment insomnia Chronic, fragmented sleep: wakes up after 3-4hrs and unable to return to sleep, takes to fallasleep. Unable to tolerate melatonin: headache  Advised to try benadryl or magnesium glycinate OTC  Depression screen [facility] 2/9 07/15/2021 04/30/2021 04/13/2018  Decreased Interest 2 3 2   Down, Depressed, Hopeless 1 3 2   PHQ - 2 Score 3 6 4   Altered sleeping 3 3 0  Tired, decreased energy 3 3 2   Change in appetite 3 0 2  Feeling bad or failure about yourself  0 1 2  Trouble concentrating 1 0 2  Moving slowly or fidgety/restless 0 0 2  Suicidal thoughts 0 1 0  PHQ-9 Score 13 14 14   Difficult doing work/chores - Very difficult Somewhat difficult     Reviewed past Medical, Social and Family history today.  Outpatient Medications Prior to Visit  Medication Sig Dispense Refill   hydrochlorothiazide (MICROZIDE) 12.5 MG capsule Take 1 capsule (12.5 mg total) by mouth daily. 90 capsule 1   DULoxetine (CYMBALTA) 20 MG capsule Take 1 capsule (20 mg total) by mouth daily. (Patient not taking: Reported on 07/15/2021) 30 capsule 5   No facility-administered medications prior to visit.    ROS See HPI  Objective:  BP 124/86 (BP Location: Left Arm, Patient Position: Sitting, Cuff Size: Large)   Pulse 95   Temp 97.8 F (36.6 C) (Temporal)   Ht 5\' 8"  (1.727 m)   Wt 261 lb 12.8 oz (118.8 kg)    SpO2 97%   BMI 39.81 kg/m   Physical Exam Vitals reviewed.  Cardiovascular:     Rate and Rhythm: Normal rate.     Pulses: Normal pulses.  Pulmonary:     Effort: Pulmonary effort is normal.  Neurological:     Mental Status: She is alert and oriented to person, place, and time.  Psychiatric:        Mood and Affect: Mood normal.        Behavior: Behavior normal.        Thought Content: Thought content normal.    Assessment & Plan:  This visit occurred during the SARS-CoV-2 public health emergency.  Safety protocols were in place, including screening questions prior to the visit, additional usage of staff PPE, and extensive cleaning of exam room while observing appropriate contact time as indicated for disinfecting solutions.   Jessica Greene was seen today for follow-up.  Diagnoses and all orders for this visit:  Mood disorder (HCC)  Adjustment insomnia  Hypokalemia -     potassium chloride SA (KLOR-CON) 20 MEQ tablet; Take 1 tablet (20 mEq total) by mouth daily.   Problem List Items Addressed This Visit       Other   Adjustment insomnia    Chronic, fragmented sleep: wakes up after 3-4hrs and unable to return to sleep, takes to fallasleep. Unable to tolerate melatonin: headache  Advised to try  benadryl or magnesium glycinate OTC      Mood disorder (HCC) - Primary    Stable mood with persistent difficulty staying alseep, no SI/HI Reports nausea with 1dose of cymbalta 60month ago. She has not taken med since then. She has not made appt with therapist.  Provided number to schedule appt with therapist. Advised to resume cymbalta and take with food F/up in 42month      Other Visit Diagnoses     Hypokalemia       Relevant Medications   potassium chloride SA (KLOR-CON) 20 MEQ tablet       Follow-up: Return in about 4 weeks (around 08/12/2021) for depression and insomnia.  Alysia Penna, NP

## 2021-07-15 NOTE — Patient Instructions (Addendum)
Resume cymbalta, take when awake with food  Call [facility] for appt with psychologist. Phone:[phone].  Try benadryl 25mg  or magnesium glycinate 200mg  at bedtime for insomnia.  Do not take these if unable to sleep 8hrs.  Insomnia Insomnia is a sleep disorder that makes it difficult to fall asleep or stay asleep. Insomnia can cause fatigue, low energy, difficulty concentrating, moodswings, and poor performance at work or school. There are three different ways to classify insomnia: Difficulty falling asleep. Difficulty staying asleep. Waking up too early in the morning. Any type of insomnia can be long-term (chronic) or short-term (acute). Both are common. Short-term insomnia usually lasts for three months or less. Chronic insomnia occurs at least three times a week for longer than threemonths. What are the causes? Insomnia may be caused by another condition, situation, or substance, such as: Anxiety. Certain medicines. Gastroesophageal reflux disease (GERD) or other gastrointestinal conditions. Asthma or other breathing conditions. Restless legs syndrome, sleep apnea, or other sleep disorders. Chronic pain. Menopause. Stroke. Abuse of alcohol, tobacco, or illegal drugs. Mental health conditions, such as depression. Caffeine. Neurological disorders, such as Alzheimer's disease. An overactive thyroid (hyperthyroidism). Sometimes, the cause of insomnia may not be known. What increases the risk? Risk factors for insomnia include: Gender. Women are affected more often than men. Age. Insomnia is more common as you get older. Stress. Lack of exercise. Irregular work schedule or working night shifts. Traveling between different time zones. Certain medical and mental health conditions. What are the signs or symptoms? If you have insomnia, the main symptom is having trouble falling asleep or having trouble staying asleep. This may lead to other symptoms, such as: Feeling  fatigued or having low energy. Feeling nervous about going to sleep. Not feeling rested in the morning. Having trouble concentrating. Feeling irritable, anxious, or depressed. How is this diagnosed? This condition may be diagnosed based on: Your symptoms and medical history. Your health care provider may ask about: Your sleep habits. Any medical conditions you have. Your mental health. A physical exam. How is this treated? Treatment for insomnia depends on the cause. Treatment may focus on treating an underlying condition that is causing insomnia. Treatment may also include: Medicines to help you sleep. Counseling or therapy. Lifestyle adjustments to help you sleep better. Follow these instructions at home: Eating and drinking  Limit or avoid alcohol, caffeinated beverages, and cigarettes, especially close to bedtime. These can disrupt your sleep. Do not eat a large meal or eat spicy foods right before bedtime. This can lead to digestive discomfort that can make it hard for you to sleep.  Sleep habits  Keep a sleep diary to help you and your health care provider figure out what could be causing your insomnia. Write down: When you sleep. When you wake up during the night. How well you sleep. How rested you feel the next day. Any side effects of medicines you are taking. What you eat and drink. Make your bedroom a dark, comfortable place where it is easy to fall asleep. Put up shades or blackout curtains to block light from outside. Use a white noise machine to block noise. Keep the temperature cool. Limit screen use before bedtime. This includes: Watching TV. Using your smartphone, tablet, or computer. Stick to a routine that includes going to bed and waking up at the same times every day and night. This can help you fall asleep faster. Consider making a quiet activity, such as reading, part of your nighttime routine. Try to avoid  taking naps during the day so that you sleep  better at night. Get out of bed if you are still awake after 15 minutes of trying to sleep. Keep the lights down, but try reading or doing a quiet activity. When you feel sleepy, go back to bed.  General instructions Take over-the-counter and prescription medicines only as told by your health care provider. Exercise regularly, as told by your health care provider. Avoid exercise starting several hours before bedtime. Use relaxation techniques to manage stress. Ask your health care provider to suggest some techniques that may work well for you. These may include: Breathing exercises. Routines to release muscle tension. Visualizing peaceful scenes. Make sure that you drive carefully. Avoid driving if you feel very sleepy. Keep all follow-up visits as told by your health care provider. This is important. Contact a health care provider if: You are tired throughout the day. You have trouble in your daily routine due to sleepiness. You continue to have sleep problems, or your sleep problems get worse. Get help right away if: You have serious thoughts about hurting yourself or someone else. If you ever feel like you may hurt yourself or others, or have thoughts about taking your own life, get help right away. You can go to your nearest emergency department or call: Your local emergency services (911 in the [place] A suicide crisis helpline, such as the National Suicide Prevention Lifeline at 816 303 1305. This is open 24 hours a day. Summary Insomnia is a sleep disorder that makes it difficult to fall asleep or stay asleep. Insomnia can be long-term (chronic) or short-term (acute). Treatment for insomnia depends on the cause. Treatment may focus on treating an underlying condition that is causing insomnia. Keep a sleep diary to help you and your health care provider figure out what could be causing your insomnia. This information is not intended to replace advice given to you by your health care  provider. Make sure you discuss any questions you have with your healthcare provider. Document Revised: 09/19/2020 Document Reviewed: 09/19/2020 Elsevier Patient Education  2022 [employer].

## 2021-07-15 NOTE — Assessment & Plan Note (Signed)
Chronic, fragmented sleep: wakes up after 3-4hrs and unable to return to sleep, takes to fallasleep. Unable to tolerate melatonin: headache  Advised to try benadryl or magnesium glycinate OTC

## 2021-07-15 NOTE — Assessment & Plan Note (Signed)
Stable mood with persistent difficulty staying alseep, no SI/HI Reports nausea with 1dose of cymbalta 30month ago. She has not taken med since then. She has not made appt with therapist.  Provided number to schedule appt with therapist. Advised to resume cymbalta and take with food F/up in 82month

## 2021-08-13 ENCOUNTER — Other Ambulatory Visit (HOSPITAL_COMMUNITY)
Admission: RE | Admit: 2021-08-13 | Discharge: 2021-08-13 | Disposition: A | Discharge status: Home / Self Care | Payer: BC Managed Care – PPO | Source: Ambulatory Visit | Attending: Nurse Practitioner | Admitting: Nurse Practitioner

## 2021-08-13 ENCOUNTER — Encounter: Payer: Self-pay | Admitting: Nurse Practitioner

## 2021-08-13 ENCOUNTER — Ambulatory Visit: Payer: BC Managed Care – PPO | Admitting: Nurse Practitioner

## 2021-08-13 ENCOUNTER — Other Ambulatory Visit: Payer: Self-pay

## 2021-08-13 VITALS — BP 128/102 | HR 76 | Temp 97.3°F | Wt 261.4 lb

## 2021-08-13 DIAGNOSIS — E876 Hypokalemia: Secondary | ICD-10-CM

## 2021-08-13 DIAGNOSIS — F39 Unspecified mood [affective] disorder: Secondary | ICD-10-CM

## 2021-08-13 DIAGNOSIS — N760 Acute vaginitis: Secondary | ICD-10-CM | POA: Diagnosis not present

## 2021-08-13 LAB — BASIC METABOLIC PANEL
BUN: 10 mg/dL (ref 6–23)
CO2: 28 mEq/L (ref 19–32)
Calcium: 9.2 mg/dL (ref 8.4–10.5)
Chloride: 99 mEq/L (ref 96–112)
Creatinine, Ser: 0.67 mg/dL (ref 0.40–1.20)
GFR: 112.61 mL/min (ref >60.00)
Glucose, Bld: 87 mg/dL (ref 70–99)
Potassium: 3.3 mEq/L — ABNORMAL LOW (ref 3.5–5.1)
Sodium: 136 mEq/L (ref 135–145)

## 2021-08-13 MED ORDER — BUPROPION HCL ER (SR) 100 MG PO TB12: 100.0000 mg | ORAL_TABLET | Freq: Two times a day (BID) | ORAL | 5 refills | Status: DC

## 2021-08-13 NOTE — Progress Notes (Signed)
Subjective:  Patient ID: Pershing Proud, female    DOB: 05/25/1986  Age: 35 y.o. MRN: [id number]  CC: Follow-up (4 wk f/u. Pt is requesting pelvic exam/STD test. Pt c/o burning sensation x3 wks)   Vaginal Itching The patient's primary symptoms include genital itching, a genital odor and vaginal discharge. The patient's pertinent negatives include no genital lesions, genital rash, missed menses, pelvic pain or vaginal bleeding. This is a new problem. The current episode started in the past 7 days. The problem occurs constantly. The problem has been unchanged. The patient is experiencing no pain. She is not pregnant. Pertinent negatives include no abdominal pain, back pain, dysuria, fever, flank pain, hematuria, painful intercourse or urgency. The vaginal discharge was bloody. The vaginal bleeding is typical of menses. She has not been passing clots. She has not been passing tissue. Nothing aggravates the symptoms. She has tried nothing for the symptoms. She is sexually active. It is unknown whether or not her partner has an STD. She uses oral contraceptives for contraception. Her menstrual history has been irregular. Her past medical history is significant for vaginosis. There is no history of PID or an STD.   Mood disorder (HCC) Still unable to tolerate cymbalta, even with food. Declines to take SSRI and TCA due to possible weight gain. Denies any SI/HI or hallucination or delusions  Agreed to start wellbutrin: titrate up to 100mg  BID. Advised about possible side effects F/up in 72month  Reviewed past Medical, Social and Family history today.  Outpatient Medications Prior to Visit  Medication Sig Dispense Refill   hydrochlorothiazide (MICROZIDE) 12.5 MG capsule Take 1 capsule (12.5 mg total) by mouth daily. 90 capsule 1   potassium chloride SA (KLOR-CON) 20 MEQ tablet Take 1 tablet (20 mEq total) by mouth daily. 30 tablet 1   DULoxetine (CYMBALTA) 20 MG capsule Take 1 capsule (20 mg  total) by mouth daily. (Patient not taking: No sig reported) 30 capsule 5   No facility-administered medications prior to visit.   ROS See HPI  Objective:  BP (!) 128/102   Pulse 76   Temp (!) 97.3 F (36.3 C) (Temporal)   Wt 261 lb 6.4 oz (118.6 kg)   SpO2 99%   BMI 39.75 kg/m   Physical Exam Vitals reviewed.  Cardiovascular:     Rate and Rhythm: Normal rate and regular rhythm.     Pulses: Normal pulses.     Heart sounds: Normal heart sounds.  Pulmonary:     Effort: Pulmonary effort is normal.     Breath sounds: Normal breath sounds.  Genitourinary:    Comments: Declined pelvic exam, opted to self swab Neurological:     Mental Status: She is alert and oriented to person, place, and time.  Psychiatric:        Mood and Affect: Mood normal.        Behavior: Behavior normal.        Thought Content: Thought content normal.   Assessment & Plan:  This visit occurred during the SARS-CoV-2 public health emergency.  Safety protocols were in place, including screening questions prior to the visit, additional usage of staff PPE, and extensive cleaning of exam room while observing appropriate contact time as indicated for disinfecting solutions.   [place] was seen today for follow-up.  Diagnoses and all orders for this visit:  Mood disorder (HCC) -     buPROPion ER (WELLBUTRIN SR) 100 MG 12 hr tablet; Take 1 tablet (100 mg total) by mouth 2 (two)  times daily.  Hypokalemia -     Basic metabolic panel  Acute vaginitis -     Cervicovaginal ancillary only( [place])  Problem List Items Addressed This Visit       Other   Mood disorder (HCC) - Primary    Still unable to tolerate cymbalta, even with food. Declines to take SSRI and TCA due to possible weight gain. Denies any SI/HI or hallucination or delusions  Agreed to start wellbutrin: titrate up to 100mg  BID. Advised about possible side effects F/up in 12month      Relevant Medications   buPROPion ER (WELLBUTRIN  SR) 100 MG 12 hr tablet   Other Visit Diagnoses     Hypokalemia       Relevant Orders   Basic metabolic panel   Acute vaginitis       Relevant Orders   Cervicovaginal ancillary only( [place])       Follow-up: Return in about 4 weeks (around 09/10/2021) for depression.  09/12/2021, NP

## 2021-08-13 NOTE — Assessment & Plan Note (Signed)
Still unable to tolerate cymbalta, even with food. Declines to take SSRI and TCA due to possible weight gain. Denies any SI/HI or hallucination or delusions  Agreed to start wellbutrin: titrate up to 100mg  BID. Advised about possible side effects F/up in 48month

## 2021-08-13 NOTE — Patient Instructions (Addendum)
D/c cymbalta Start wellbutin 100mg  with breakfast x 1week, then 100mg  with breakfast and supper continuously. Take medication with food.  Go to lab for blood draw  Bupropion Tablets (Depression/Mood Disorders) What is this medication? BUPROPION (byoo PROE pee on) treats depression. It increases norepinephrine and dopamine in the brain, hormones that help regulate mood. It belongs to a group of medications called NDRIs. This medicine may be used for other purposes; ask your health care provider or pharmacist if you have questions. COMMON BRAND NAME(S): Wellbutrin What should I tell my care team before I take this medication? They need to know if you have any of these conditions: An eating disorder, such as anorexia or bulimia Bipolar disorder or psychosis Diabetes or high blood sugar, treated with medication Glaucoma Heart disease, previous heart attack, or irregular heart beat Head injury or brain tumor High blood pressure Kidney or liver disease Seizures Suicidal thoughts or a previous suicide attempt Tourette's syndrome Weight loss An unusual or allergic reaction to bupropion, other medications, foods, dyes, or preservatives Pregnant or trying to become pregnant Breast-feeding How should I use this medication? Take this medication by mouth with a glass of water. Follow the directions on the prescription label. You can take it with or without food. If it upsets your stomach, take it with food. Take your medication at regular intervals. Do not take your medication more often than directed. Do not stop taking this medication suddenly except upon the advice of your care team. Stopping this medication too quickly may cause serious side effects or your condition may worsen. A special MedGuide will be given to you by the pharmacist with each prescription and refill. Be sure to read this information carefully each time. Talk to your care team regarding the use of this medication in children.  Special care may be needed. Overdosage: If you think you have taken too much of this medicine contact a poison control center or emergency room at once. NOTE: This medicine is only for you. Do not share this medicine with others. What if I miss a dose? If you miss a dose, take it as soon as you can. If it is less than four hours to your next dose, take only that dose and skip the missed dose. Do not take double or extra doses. What may interact with this medication? Do not take this medication with any of the following: Linezolid MAOIs like Azilect, Carbex, Eldepryl, Marplan, Nardil, and Parnate Methylene blue (injected into a vein) Other medications that contain bupropion like Zyban This medication may also interact with the following: Alcohol Certain medications for anxiety or sleep Certain medications for blood pressure like metoprolol, propranolol Certain medications for depression or psychotic disturbances Certain medications for HIV or AIDS like efavirenz, lopinavir, nelfinavir, ritonavir Certain medications for irregular heart beat like propafenone, flecainide Certain medications for Parkinson's disease like amantadine, levodopa Certain medications for seizures like carbamazepine, phenytoin, phenobarbital Cimetidine Clopidogrel Cyclophosphamide Digoxin Furazolidone Isoniazid Nicotine Orphenadrine Procarbazine Steroid medications like prednisone or cortisone Stimulant medications for attention disorders, weight loss, or to stay awake Tamoxifen Theophylline Thiotepa Ticlopidine Tramadol Warfarin This list may not describe all possible interactions. Give your health care provider a list of all the medicines, herbs, non-prescription drugs, or dietary supplements you use. Also tell them if you smoke, drink alcohol, or use illegal drugs. Some items may interact with your medicine. What should I watch for while using this medication? Tell your care team if your symptoms do not  get better or  if they get worse. Visit your care team for regular checks on your progress. Because it may take several weeks to see the full effects of this medication, it is important to continue your treatment as prescribed. Watch for new or worsening thoughts of suicide or depression. This includes sudden changes in mood, behavior, or thoughts. These changes can happen at any time but are more common in the beginning of treatment or after a change in dose. Call your care team right away if you experience these thoughts or worsening depression. Manic episodes may happen in patients with bipolar disorder who take this medication. Watch for changes in feelings or behaviors such as feeling anxious, nervous, agitated, panicky, irritable, hostile, aggressive, impulsive, severely restless, overly excited and hyperactive, or trouble sleeping. These symptoms can happen at anytime but are more common in the beginning of treatment or after a change in dose. Call your care team right away if you notice any of these symptoms. This medication may cause serious skin reactions. They can happen weeks to months after starting the medication. Contact your care team right away if you notice fevers or flu-like symptoms with a rash. The rash may be red or purple and then turn into blisters or peeling of the skin. Or, you might notice a red rash with swelling of the face, lips or lymph nodes in your neck or under your arms. Avoid drinks that contain alcohol while taking this medication. Drinking large amounts of alcohol, using sleeping or anxiety medications, or quickly stopping the use of these agents while taking this medication may increase your risk for a seizure. Do not drive or use heavy machinery until you know how this medication affects you. This medication can impair your ability to perform these tasks. Do not take this medication close to bedtime. It may prevent you from sleeping. Your mouth may get dry. Chewing  sugarless gum or sucking hard candy, and drinking plenty of water may help. Contact your care team if the problem does not go away or is severe. What side effects may I notice from receiving this medication? Side effects that you should report to your care team as soon as possible: Allergic reactions-skin rash, itching, hives, swelling of the face, lips, tongue, or throat Increase in blood pressure Mood and behavior changes-anxiety, nervousness, confusion, hallucinations, irritability, hostility, thoughts of suicide or self-harm, worsening mood, feelings of depression Redness, blistering, peeling, or loosening of the skin, including inside the mouth Seizures Sudden eye pain or change in vision such as blurry vision, seeing halos around lights, vision loss Side effects that usually do not require medical attention (report to your care team if they continue or are bothersome): Constipation Dizziness Dry mouth Loss of appetite Nausea Tremors or shaking Trouble sleeping This list may not describe all possible side effects. Call your doctor for medical advice about side effects. You may report side effects to FDA at 1-800-FDA-1088. Where should I keep my medication? Keep out of the reach of children and pets. Store at room temperature between 20 and 25 degrees C (68 and 77 degrees F), away from direct sunlight and moisture. Keep tightly closed. Throw away any unused medication after the expiration date. NOTE: This sheet is a summary. It may not cover all possible information. If you have questions about this medicine, talk to your doctor, pharmacist, or health care provider.  2022 Elsevier/Gold Standard (2021-01-21 14:26:11)

## 2021-08-14 LAB — CERVICOVAGINAL ANCILLARY ONLY
Bacterial Vaginitis (gardnerella): NEGATIVE
Candida Glabrata: NEGATIVE
Candida Vaginitis: NEGATIVE
Chlamydia: NEGATIVE
Comment: NEGATIVE
Comment: NEGATIVE
Comment: NEGATIVE
Comment: NEGATIVE
Comment: NEGATIVE
Comment: NORMAL
Neisseria Gonorrhea: NEGATIVE
Trichomonas: NEGATIVE

## 2021-08-15 MED ORDER — POTASSIUM CHLORIDE CRYS ER 20 MEQ PO TBCR: 20.0000 meq | EXTENDED_RELEASE_TABLET | Freq: Two times a day (BID) | ORAL | 1 refills | Status: DC

## 2021-08-15 NOTE — Addendum Note (Signed)
Addended by: Alysia Penna L on: 08/15/2021 10:21 AM   Modules accepted: Orders

## 2021-09-17 ENCOUNTER — Ambulatory Visit: Payer: BC Managed Care – PPO | Admitting: Nurse Practitioner

## 2021-11-21 ENCOUNTER — Encounter: Payer: Self-pay | Admitting: Nurse Practitioner

## 2021-11-21 ENCOUNTER — Other Ambulatory Visit: Payer: Self-pay

## 2021-11-21 DIAGNOSIS — E876 Hypokalemia: Secondary | ICD-10-CM

## 2021-11-21 DIAGNOSIS — I10 Essential (primary) hypertension: Secondary | ICD-10-CM

## 2021-11-21 MED ORDER — POTASSIUM CHLORIDE CRYS ER 20 MEQ PO TBCR: 20.0000 meq | EXTENDED_RELEASE_TABLET | Freq: Two times a day (BID) | ORAL | 1 refills | Status: DC

## 2021-11-21 MED ORDER — HYDROCHLOROTHIAZIDE 12.5 MG PO CAPS: 12.5000 mg | ORAL_CAPSULE | Freq: Every day | ORAL | 1 refills | Status: DC

## 2021-11-21 NOTE — Telephone Encounter (Signed)
Chart supports Rx Last seen 08/13/21 Pt instructed to call and schedule follow up appointment.

## 2021-12-25 ENCOUNTER — Emergency Department (HOSPITAL_COMMUNITY)
Admission: EM | Admit: 2021-12-25 | Discharge: 2021-12-25 | Disposition: A | Discharge status: Home / Self Care | Payer: BC Managed Care – PPO | Attending: Student | Admitting: Student

## 2021-12-25 ENCOUNTER — Encounter (HOSPITAL_COMMUNITY): Payer: Self-pay | Admitting: Emergency Medicine

## 2021-12-25 DIAGNOSIS — Z79899 Other long term (current) drug therapy: Secondary | ICD-10-CM | POA: Insufficient documentation

## 2021-12-25 DIAGNOSIS — H73891 Other specified disorders of tympanic membrane, right ear: Secondary | ICD-10-CM | POA: Insufficient documentation

## 2021-12-25 DIAGNOSIS — J3489 Other specified disorders of nose and nasal sinuses: Secondary | ICD-10-CM | POA: Diagnosis not present

## 2021-12-25 DIAGNOSIS — I10 Essential (primary) hypertension: Secondary | ICD-10-CM | POA: Diagnosis not present

## 2021-12-25 DIAGNOSIS — J029 Acute pharyngitis, unspecified: Secondary | ICD-10-CM | POA: Diagnosis not present

## 2021-12-25 DIAGNOSIS — J069 Acute upper respiratory infection, unspecified: Secondary | ICD-10-CM

## 2021-12-25 DIAGNOSIS — Z20822 Contact with and (suspected) exposure to covid-19: Secondary | ICD-10-CM | POA: Diagnosis not present

## 2021-12-25 LAB — GROUP A STREP BY PCR: Group A Strep by PCR: NOT DETECTED

## 2021-12-25 LAB — RESP PANEL BY RT-PCR (FLU A&B, COVID) ARPGX2
Influenza A by PCR: NEGATIVE
Influenza B by PCR: NEGATIVE
SARS Coronavirus 2 by RT PCR: NEGATIVE

## 2021-12-25 MED ORDER — AMOXICILLIN-POT CLAVULANATE 875-125 MG PO TABS: 1.0000 | ORAL_TABLET | Freq: Two times a day (BID) | ORAL | 0 refills | Status: DC

## 2021-12-25 NOTE — Discharge Instructions (Addendum)
An antibiotic by the name of Augmentin has been sent to your [facility] pharmacy for you.  Please take this with food and try to take it with yogurt as well to lessen the chance of an upset stomach and provide some good bacteria for your GI system.  This is twice a day for 1 week.  Please take this to completion.    Can use OTC Zyrtec and Flonase for the next week.  This should help dry up some of your nasal secretions.  Can use OTC Corcidin to help with cough suppression, as this is a safer option for people with high blood pressure.  Avoid Sudafed with your history of high blood pressure.

## 2021-12-25 NOTE — ED Triage Notes (Signed)
Pt. Stated, ive had a sore throat that started just scratchy and now I cant talk, and hardly swallow.

## 2021-12-25 NOTE — ED Provider Notes (Signed)
[facility] EMERGENCY DEPARTMENT Provider Note   CSN: [id number] Arrival date & time: 12/25/21  0626     History  Chief Complaint  Patient presents with   Sore Throat   Cough    Jessica Greene is a 36 y.o. female with a history of GERD, HTN, HLD, PCOS presents 3 weeks of rhinorrhea and 2 weeks of sore throat and cough.  Patient stated initially her throat became itchy and then the cough developed.  Complains of accompanying headaches and right ear pain that started today.  Denies shortness of breath, chest pain, fever, chills, nausea, vomiting, diarrhea, or abdominal pain.  The throat pain is described as constant and scratchy.  Cough is unproductive and intermittent.  Patient states she is familiar with her symptoms of GERD and says these do not feel like that.  No known history of pneumonia.  Patient's daughter has similar symptoms and has just started public school.    The history is provided by the patient and medical records.  Sore Throat Pertinent negatives include no chest pain, no abdominal pain and no shortness of breath.  Cough Associated symptoms: ear pain, rhinorrhea and sore throat   Associated symptoms: no chest pain, no chills, no diaphoresis, no fever, no shortness of breath and no wheezing       Home Medications Prior to Admission medications   Medication Sig Start Date End Date Taking? Authorizing Provider  amoxicillin-clavulanate (AUGMENTIN) 875-125 MG tablet Take 1 tablet by mouth every 12 (twelve) hours. 12/25/21  Yes Cecil Cobbs, PA-C  buPROPion ER [facility] SR) 100 MG 12 hr tablet Take 1 tablet (100 mg total) by mouth 2 (two) times daily. 08/13/21   Nche, Bonna Gains, NP  hydrochlorothiazide (MICROZIDE) 12.5 MG capsule Take 1 capsule (12.5 mg total) by mouth daily. 11/21/21   Nche, Bonna Gains, NP  potassium chloride SA (KLOR-CON M) 20 MEQ tablet Take 1 tablet (20 mEq total) by mouth 2 (two) times daily. 11/21/21   Nche,  Bonna Gains, NP      Allergies    Patient has no known allergies.    Review of Systems   Review of Systems  Constitutional:  Negative for activity change, chills, diaphoresis and fever.  HENT:  Positive for congestion, ear pain, postnasal drip, rhinorrhea, sinus pressure, sore throat, trouble swallowing and voice change. Negative for hearing loss, nosebleeds and sinus pain.        Right ear pain  Eyes:  Negative for photophobia and pain.  Respiratory:  Positive for cough. Negative for chest tightness, shortness of breath and wheezing.   Cardiovascular:  Negative for chest pain and palpitations.  Gastrointestinal:  Negative for abdominal pain, constipation, diarrhea, nausea and vomiting.  Genitourinary: Negative.    Physical Exam Updated Vital Signs BP 140/82    Pulse 95    Temp 98 F (36.7 C) (Oral)    Resp 15    SpO2 100%  Physical Exam Vitals and nursing note reviewed.  Constitutional:      General: She is not in acute distress.    Appearance: She is well-developed. She is not ill-appearing or diaphoretic.  HENT:     Head: Normocephalic and atraumatic.     Right Ear: Ear canal normal. Tenderness present. No drainage. Tympanic membrane is erythematous.     Left Ear: Tympanic membrane and ear canal normal.     Nose: Congestion and rhinorrhea present.     Mouth/Throat:     Mouth: Mucous membranes are moist.  Pharynx: Oropharynx is clear. Uvula midline. No pharyngeal swelling, oropharyngeal exudate, posterior oropharyngeal erythema or uvula swelling.     Tonsils: No tonsillar exudate or tonsillar abscesses. 1+ on the right. 1+ on the left.  Eyes:     Conjunctiva/sclera: Conjunctivae normal.  Cardiovascular:     Rate and Rhythm: Normal rate and regular rhythm.     Pulses:          Radial pulses are 2+ on the right side and 2+ on the left side.     Heart sounds: Normal heart sounds. No murmur heard. Pulmonary:     Effort: Pulmonary effort is normal. No respiratory  distress.     Breath sounds: Normal breath sounds. No wheezing.  Abdominal:     General: Bowel sounds are normal.     Palpations: Abdomen is soft.     Tenderness: There is no abdominal tenderness.  Musculoskeletal:        General: No swelling.     Cervical back: Neck supple.  Skin:    General: Skin is warm and dry.     Capillary Refill: Capillary refill takes less than 2 seconds.  Neurological:     Mental Status: She is alert and oriented to person, place, and time.  Psychiatric:        Mood and Affect: Mood normal.    ED Results / Procedures / Treatments   Labs (all labs ordered are listed, but only abnormal results are displayed) Labs Reviewed  GROUP A STREP BY PCR  RESP PANEL BY RT-PCR (FLU A&B, COVID) [id number]    EKG None  Radiology No results found.  Procedures Procedures    Medications Ordered in ED Medications - No data to display  ED Course/ Medical Decision Making/ A&P                           Medical Decision Making Amount and/or Complexity of Data Reviewed External Data Reviewed: notes. Labs: ordered. Decision-making details documented in ED Course.   89-year-old female evaluated for rhinorrhea, sore throat, cough.  DDx includes acute sinusitis, tonsillitis, pneumonia, pharyngitis.  I ordered and personally interpreted labs.  Pertinent results include negative group A strep, COVID, influenza.  Based on the patient's history having a nonproductive cough, recent familial close contacts with similar symptoms, this is likely an acute Sinusitis.  Patient does not have shortness of breath or difficulty breathing, lungs sound normal on exam this is unlikely pneumonia.  Patient is afebrile and tonsils are without exudate or erythema or inflammation.  Oropharynx is unremarkable.  Do not believe the patient has a peritonsillar abscess or tonsillitis.  Consider ordering an x-ray of the neck, but patient is unlikely experiencing a peritonsillar abscess or  epiglottitis.  Patient able to communicate clearly and without difficulty.  Mild occasional hoarseness noted on exam, which I believe is due to repetitive cough.  No cough witnessed during several interactions with the patient.  Patient able to breathe without difficulty.  No epigastric pain, symptoms not worse at night or after meals or sitting.  Do not believe this is related to the patient's GERD.  Symptoms right TM erythematous and slightly bulging on physical exam.  This is correlated by the patient's history and likely due to consistent sniffing with rhinorrhea and postnasal drip.  Likely the infection is backing up the eustachian tube.  Overall, I do not believe she is currently experiencing a medical, surgical, or psychiatric emergency at this time.  I discussed the patient and their case with my attending, Dr. Posey ReaKommor and Jodi GeraldsKelsey Ford PA-C, who agreed with the proposed treatment course.  After consideration of the diagnostic results and the patients response to treatment, I feel that the patent would benefit from antibiotics to treat the nasal congestion/infection in the possible early presentation of otitis media.  Discussed course of treatment thoroughly with the patient and she demonstrated understanding.  Patient in agreement and has no further questions.        Final Clinical Impression(s) / ED Diagnoses Final diagnoses:  Upper respiratory tract infection, unspecified type    Rx / DC Orders ED Discharge Orders          Ordered    amoxicillin-clavulanate (AUGMENTIN) 875-125 MG tablet  Every 12 hours        12/25/21 1022              Cecil CobbsCockerham, Kaleeah Gingerich M, [place] JerseyPA-C 12/25/21 1045    Kommor, [place], MD 12/25/21 1725

## 2022-01-09 ENCOUNTER — Ambulatory Visit (HOSPITAL_COMMUNITY)
Admission: EM | Admit: 2022-01-09 | Discharge: 2022-01-09 | Disposition: A | Discharge status: Home / Self Care | Payer: BC Managed Care – PPO | Attending: Family Medicine | Admitting: Family Medicine

## 2022-01-09 ENCOUNTER — Encounter (HOSPITAL_COMMUNITY): Payer: Self-pay

## 2022-01-09 DIAGNOSIS — M5442 Lumbago with sciatica, left side: Secondary | ICD-10-CM

## 2022-01-09 DIAGNOSIS — Z113 Encounter for screening for infections with a predominantly sexual mode of transmission: Secondary | ICD-10-CM | POA: Diagnosis not present

## 2022-01-09 DIAGNOSIS — A5909 Other urogenital trichomoniasis: Secondary | ICD-10-CM | POA: Diagnosis not present

## 2022-01-09 DIAGNOSIS — M5441 Lumbago with sciatica, right side: Secondary | ICD-10-CM

## 2022-01-09 MED ORDER — IBUPROFEN 800 MG PO TABS: 800.0000 mg | ORAL_TABLET | Freq: Three times a day (TID) | ORAL | 0 refills | Status: DC | PRN

## 2022-01-09 MED ORDER — KETOROLAC TROMETHAMINE 30 MG/ML IJ SOLN
INTRAMUSCULAR | Status: AC
  Filled 2022-01-09: qty 1

## 2022-01-09 MED ORDER — TIZANIDINE HCL 4 MG PO TABS: 4.0000 mg | ORAL_TABLET | Freq: Three times a day (TID) | ORAL | 0 refills | Status: DC | PRN

## 2022-01-09 MED ORDER — KETOROLAC TROMETHAMINE 30 MG/ML IJ SOLN
30.0000 mg | Freq: Once | INTRAMUSCULAR | Status: AC
  Administered 2022-01-09: 30 mg via INTRAMUSCULAR

## 2022-01-09 NOTE — ED Provider Notes (Addendum)
[facility]    CSN: [id number] Arrival date & time: 01/09/22  1801      History   Chief Complaint Chief Complaint  Patient presents with   Back Pain    HPI Jessica Greene is a 36 y.o. female.    Back Pain Here with a several day history of worsening bilateral low back pain.  It is radiating into both legs.  No muscle weakness or new neurologic symptoms.  No recent trauma or fall.  No dysuria or fever or chills.  She has had sciatic problems before.  She is interested in STI testing.  She states that she has a little vaginal bleeding every day.  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   Mixed hyperlipidemia 05/13/2021   Class 3 severe obesity due to excess calories with serious comorbidity and body mass index (BMI) of 40.0 to 44.9 in adult (HCC) 04/30/2021   Mood disorder (HCC) 04/14/2018   Uterine polyp 02/28/2018   Hyperglycemia 02/28/2018   HTN (hypertension) 02/28/2018   GERD (gastroesophageal reflux disease) 02/28/2018    Past Surgical History:  Procedure Laterality Date   ESOPHAGOGASTRODUODENOSCOPY ENDOSCOPY  2013   POLYPECTOMY  06/29/2016   s/p hysteroscopic polypectomy     OB History     Gravida  2   Para      Term      Preterm      AB  1   Living         SAB  1   IAB      Ectopic      Multiple      Live Births               Home Medications    Prior to Admission medications   Medication Sig Start Date End Date Taking? Authorizing Provider  ibuprofen (ADVIL) 800 MG tablet Take 1 tablet (800 mg total) by mouth every 8 (eight) hours as needed (pain). 01/09/22  Yes Asani Mcburney, Janace Aris, MD  tiZANidine (ZANAFLEX) 4 MG  tablet Take 1 tablet (4 mg total) by mouth every 8 (eight) hours as needed for muscle spasms. 01/09/22  Yes Zenia Resides, MD  buPROPion ER [facility] SR) 100 MG 12 hr tablet Take 1 tablet (100 mg total) by mouth 2 (two) times daily. 08/13/21   Nche, Bonna Gains, NP  hydrochlorothiazide (MICROZIDE) 12.5 MG capsule Take 1 capsule (12.5 mg total) by mouth daily. 11/21/21   Nche, Bonna Gains, NP  potassium chloride SA (KLOR-CON M) 20 MEQ tablet Take 1 tablet (20 mEq total) by mouth 2 (two) times daily. 11/21/21   Nche, Bonna Gains, NP    Family History Family History  Problem Relation Age of Onset   Diabetes Mother    Hypertension Mother    Hyperlipidemia Mother    Heart disease Mother    Hypertension Father    Diabetes Father    Hyperlipidemia Father    Heart disease Father    Stroke Father    Cancer Father        pancreatic   Heart disease Sister    Depression Sister    Bipolar disorder  Sister    Heart disease Maternal Aunt    Heart disease Maternal Uncle    Heart disease Paternal Aunt    Heart disease Paternal Uncle    Heart disease Maternal Grandmother    Heart disease Maternal Grandfather    Heart disease Paternal Grandmother    Heart disease Paternal Grandfather    Birth defects Paternal Grandfather     Social History Social History   Tobacco Use   Smoking status: Former   Smokeless tobacco: Never  Building services engineer Use: Never used  Substance Use Topics   Alcohol use: Yes    Comment: occasionally   Drug use: Yes    Frequency: 1.0 times per week    Types: Marijuana    Comment: use of extasy in past, last used 2009     Allergies   Patient has no known allergies.   Review of Systems Review of Systems  Musculoskeletal:  Positive for back pain.    Physical Exam Triage Vital Signs ED Triage Vitals  Enc Vitals Group     BP 01/09/22 1902 (!) 152/98     Pulse Rate 01/09/22 1902 82     Resp 01/09/22 1902 16     Temp 01/09/22 1902 98.1 F (36.7  C)     Temp Source 01/09/22 1902 Oral     SpO2 01/09/22 1902 100 %     Weight --      Height --      Head Circumference --      Peak Flow --      Pain Score 01/09/22 1903 7     Pain Loc --      Pain Edu? --      Excl. in GC? --    No data found.  Updated Vital Signs BP (!) 152/98 (BP Location: Left Arm)    Pulse 82    Temp 98.1 F (36.7 C) (Oral)    Resp 16    SpO2 100%   Visual Acuity Right Eye Distance:   Left Eye Distance:   Bilateral Distance:    Right Eye Near:   Left Eye Near:    Bilateral Near:     Physical Exam Vitals reviewed.  Constitutional:      General: She is not in acute distress.    Appearance: She is not toxic-appearing.  HENT:     Mouth/Throat:     Mouth: Mucous membranes are moist.  Eyes:     Extraocular Movements: Extraocular movements intact.     Pupils: Pupils are equal, round, and reactive to light.  Cardiovascular:     Rate and Rhythm: Normal rate and regular rhythm.     Heart sounds: No murmur heard. Pulmonary:     Breath sounds: No wheezing, rhonchi or rales.  Abdominal:     Palpations: Abdomen is soft.     Tenderness: There is no abdominal tenderness.  Musculoskeletal:     Cervical back: Neck supple.  Lymphadenopathy:     Cervical: No cervical adenopathy.  Skin:    Capillary Refill: Capillary refill takes less than 2 seconds.     Coloration: Skin is not jaundiced or pale.  Neurological:     General: No focal deficit present.     Mental Status: She is alert and oriented to person, place, and time.     Comments: SLR causes increase in back pain, and radiation into lower posterior thighs  Psychiatric:        Behavior: Behavior normal.  UC Treatments / Results  Labs (all labs ordered are listed, but only abnormal results are displayed) Labs Reviewed  CERVICOVAGINAL ANCILLARY ONLY    EKG   Radiology No results found.  Procedures Procedures (including critical care time)  Medications Ordered in UC Medications   ketorolac (TORADOL) 30 MG/ML injection 30 mg (has no administration in time range)    Initial Impression / Assessment and Plan / UC Course  I have reviewed the triage vital signs and the nursing notes.  Pertinent labs & imaging results that were available during my care of the patient were reviewed by me and considered in my medical decision making (see chart for details).     We will treat with pain relievers and muscle relaxers.  Toradol given tonight.  I have asked her to follow-up with her primary doctor to see if she needs further evaluation of her back.  STI testing done at her request, and staff will let her know if she needs any treatment Final Clinical Impressions(s) / UC Diagnoses   Final diagnoses:  Acute bilateral low back pain with bilateral sciatica     Discharge Instructions      You have been given a shot of Toradol 30 mg IM here tonight  Take ibuprofen 800 mg 1 every 8 hours as needed for pain  Take tizanidine 4 mg 1 every 8 hours as needed for muscle pain or spasm  She can apply heating pad to the sore area on your back  You can also try back stretches     ED Prescriptions     Medication Sig Dispense Auth. Provider   ibuprofen (ADVIL) 800 MG tablet Take 1 tablet (800 mg total) by mouth every 8 (eight) hours as needed (pain). 21 tablet Jessia Kief, Janace Aris, MD   tiZANidine (ZANAFLEX) 4 MG tablet Take 1 tablet (4 mg total) by mouth every 8 (eight) hours as needed for muscle spasms. 30 tablet Kevyn Wengert, Janace Aris, MD      PDMP not reviewed this encounter.   Zenia Resides, MD 01/09/22 1947    Zenia Resides, MD 01/09/22 1950

## 2022-01-09 NOTE — ED Triage Notes (Signed)
Pt reports lower back pain for several days.  She would like STI testing today.

## 2022-01-09 NOTE — Discharge Instructions (Addendum)
You have been given a shot of Toradol 30 mg IM here tonight  Take ibuprofen 800 mg 1 every 8 hours as needed for pain  Take tizanidine 4 mg 1 every 8 hours as needed for muscle pain or spasm  She can apply heating pad to the sore area on your back  You can also try back stretches

## 2022-01-10 ENCOUNTER — Telehealth: Payer: BC Managed Care – PPO | Admitting: Nurse Practitioner

## 2022-01-10 DIAGNOSIS — K0889 Other specified disorders of teeth and supporting structures: Secondary | ICD-10-CM | POA: Diagnosis not present

## 2022-01-10 MED ORDER — NAPROXEN 500 MG PO TABS: 500.0000 mg | ORAL_TABLET | Freq: Two times a day (BID) | ORAL | 0 refills | Status: DC

## 2022-01-10 NOTE — Progress Notes (Signed)

## 2022-01-12 LAB — CERVICOVAGINAL ANCILLARY ONLY
Bacterial Vaginitis (gardnerella): NEGATIVE
Candida Glabrata: NEGATIVE
Candida Vaginitis: NEGATIVE
Chlamydia: NEGATIVE
Comment: NEGATIVE
Comment: NEGATIVE
Comment: NEGATIVE
Comment: NEGATIVE
Comment: NEGATIVE
Comment: NORMAL
Neisseria Gonorrhea: NEGATIVE
Trichomonas: POSITIVE — AB

## 2022-01-13 ENCOUNTER — Telehealth (HOSPITAL_COMMUNITY): Payer: Self-pay | Admitting: Emergency Medicine

## 2022-01-13 MED ORDER — METRONIDAZOLE 500 MG PO TABS: 500.0000 mg | ORAL_TABLET | Freq: Two times a day (BID) | ORAL | 0 refills | Status: DC

## 2022-01-28 ENCOUNTER — Ambulatory Visit: Payer: BC Managed Care – PPO | Admitting: Nurse Practitioner

## 2022-01-28 ENCOUNTER — Ambulatory Visit (INDEPENDENT_AMBULATORY_CARE_PROVIDER_SITE_OTHER): Payer: BC Managed Care – PPO

## 2022-01-28 ENCOUNTER — Other Ambulatory Visit: Payer: Self-pay

## 2022-01-28 ENCOUNTER — Encounter: Payer: Self-pay | Admitting: Nurse Practitioner

## 2022-01-28 VITALS — BP 136/90 | HR 80 | Temp 97.5°F | Ht 68.0 in | Wt 269.2 lb

## 2022-01-28 DIAGNOSIS — M5441 Lumbago with sciatica, right side: Secondary | ICD-10-CM | POA: Diagnosis not present

## 2022-01-28 DIAGNOSIS — G8929 Other chronic pain: Secondary | ICD-10-CM | POA: Insufficient documentation

## 2022-01-28 DIAGNOSIS — Z6841 Body Mass Index (BMI) 40.0 and over, adult: Secondary | ICD-10-CM

## 2022-01-28 DIAGNOSIS — M545 Low back pain, unspecified: Secondary | ICD-10-CM | POA: Diagnosis not present

## 2022-01-28 LAB — POCT URINE PREGNANCY: Preg Test, Ur: NEGATIVE

## 2022-01-28 MED ORDER — IBUPROFEN 600 MG PO TABS: 600.0000 mg | ORAL_TABLET | Freq: Two times a day (BID) | ORAL | 0 refills | Status: DC

## 2022-01-28 NOTE — Patient Instructions (Signed)
Complete urine pregnancy prior to x-ray ?Take ibuprofen 600mg  BID with food ?Continue muscle relaxant at bedtime. ? ?ChecK BP in AM ?Bring BP readings to next appt. ? ?Chronic Back Pain ?When back pain lasts longer than 3 months, it is called chronic back pain. Pain may get worse at certain times (flare-ups). There are things you can do at home to manage your pain. ?Follow these instructions at home: ?Pay attention to any changes in your symptoms. Take these actions to help with your pain: ?Managing pain and stiffness ?  ?If told, put ice on the painful area. Your doctor may tell you to use ice for 24-48 hours after the flare-up starts. To do this: ?Put ice in a plastic bag. ?Place a towel between your skin and the bag. ?Leave the ice on for 20 minutes, 2-3 times a day. ?If told, put heat on the painful area. Do this as often as told by your doctor. Use the heat source that your doctor recommends, such as a moist heat pack or a heating pad. ?Place a towel between your skin and the heat source. ?Leave the heat on for 20-30 minutes. ?Take off the heat if your skin turns bright red. This is especially important if you are unable to feel pain, heat, or cold. You may have a greater risk of getting burned. ?Soak in a warm bath. This can help relieve pain. ?Activity ? ?Avoid bending and other activities that make pain worse. ?When standing: ?Keep your upper back and neck straight. ?Keep your shoulders pulled back. ?Avoid slouching. ?When sitting: ?Keep your back straight. ?Relax your shoulders. Do not round your shoulders or pull them backward. ?Do not sit or stand in one place for long periods of time. ?Take short rest breaks during the day. Lying down or standing is usually better than sitting. Resting can help relieve pain. ?When sitting or lying down for a long time, do some mild activity or stretching. This will help to prevent stiffness and pain. ?Get regular exercise. Ask your doctor what activities are safe for  you. ?Do not lift anything that is heavier than 10 lb (4.5 kg) or the limit that you are told, until your doctor says that it is safe. ?To prevent injury when you lift things: ?Bend your knees. ?Keep the weight close to your body. ?Avoid twisting. ?Sleep on a firm mattress. Try lying on your side with your knees slightly bent. If you lie on your back, put a pillow under your knees. ?Medicines ?Treatment may include medicines for pain and swelling taken by mouth or put on the skin, prescription pain medicine, or muscle relaxants. ?Take over-the-counter and prescription medicines only as told by your doctor. ?Ask your doctor if the medicine prescribed to you: ?Requires you to avoid driving or using machinery. ?Can cause trouble pooping (constipation). You may need to take these actions to prevent or treat trouble pooping: ?Drink enough fluid to keep your pee (urine) pale yellow. ?Take over-the-counter or prescription medicines. ?Eat foods that are high in fiber. These include beans, whole grains, and fresh fruits and vegetables. ?Limit foods that are high in fat and sugars. These include fried or sweet foods. ?General instructions ?Do not use any products that contain nicotine or tobacco, such as cigarettes, e-cigarettes, and chewing tobacco. If you need help quitting, ask your doctor. ?Keep all follow-up visits as told by your doctor. This is important. ?Contact a doctor if: ?Your pain does not get better with rest or medicine. ?Your pain gets  worse, or you have new pain. ?You have a high fever. ?You lose weight very quickly. ?You have trouble doing your normal activities. ?Get help right away if: ?One or both of your legs or feet feel weak. ?One or both of your legs or feet lose feeling (have numbness). ?You have trouble controlling when you poop (have a bowel movement) or pee (urinate). ?You have bad back pain and: ?You feel like you may vomit (nauseous), or you vomit. ?You have pain in your belly (abdomen). ?You  have shortness of breath. ?You faint. ?Summary ?When back pain lasts longer than 3 months, it is called chronic back pain. ?Pain may get worse at certain times (flare-ups). ?Use ice and heat as told by your doctor. Your doctor may tell you to use ice after flare-ups. ?This information is not intended to replace advice given to you by your health care provider. Make sure you discuss any questions you have with your health care provider. ?Document Revised: 12/20/2019 Document Reviewed: 12/20/2019 ?Elsevier Patient Education ? [place]. ? ?

## 2022-01-28 NOTE — Progress Notes (Signed)
? ?Subjective:  ?Patient ID: Jessica Greene, female    DOB: May 11, 1986  Age: 36 y.o. MRN: [id number] ? ?CC: Acute Visit (Pt would like to discuss back pain and weight loss surgery. Pt states she would like a referral for weight loss surgery. Pt states she went to [facility] 01/09/22 due to back pain that caused pain in both her legs. ) ? ?Back Pain ?This is a recurrent (acute on chronic) problem. The current episode started 1 to 4 weeks ago. The problem occurs constantly. The problem is unchanged. The pain is present in the gluteal and lumbar spine. The quality of the pain is described as cramping and shooting. The pain radiates to the right thigh. The pain is severe. The pain is The same all the time. The symptoms are aggravated by lying down, twisting and bending. Stiffness is present All day. Associated symptoms include leg pain. Pertinent negatives include no bladder incontinence, bowel incontinence, dysuria, fever, headaches, numbness, paresis, paresthesias, pelvic pain, perianal numbness, tingling, weakness or weight loss. Risk factors include obesity, poor posture and sedentary lifestyle. She has tried analgesics and muscle relaxant for the symptoms. The treatment provided mild relief.  ?Reports repetitive lifting and pushing at current job. ? ?Reviewed past Medical, Social and Family history today. ? ?Outpatient Medications Prior to Visit  ?Medication Sig Dispense Refill  ? hydrochlorothiazide (MICROZIDE) 12.5 MG capsule Take 1 capsule (12.5 mg total) by mouth daily. 90 capsule 1  ? potassium chloride SA (KLOR-CON M) 20 MEQ tablet Take 1 tablet (20 mEq total) by mouth 2 (two) times daily. 180 tablet 1  ? tiZANidine (ZANAFLEX) 4 MG tablet Take 1 tablet (4 mg total) by mouth every 8 (eight) hours as needed for muscle spasms. 30 tablet 0  ? ibuprofen (ADVIL) 800 MG tablet Take 1 tablet (800 mg total) by mouth every 8 (eight) hours as needed (pain). 21 tablet 0  ? naproxen (NAPROSYN) 500 MG tablet Take 1 tablet  (500 mg total) by mouth 2 (two) times daily with a meal. 40 tablet 0  ? buPROPion ER (WELLBUTRIN SR) 100 MG 12 hr tablet Take 1 tablet (100 mg total) by mouth 2 (two) times daily. (Patient not taking: Reported on 01/28/2022) 60 tablet 5  ? metroNIDAZOLE (FLAGYL) 500 MG tablet Take 1 tablet (500 mg total) by mouth 2 (two) times daily. (Patient not taking: Reported on 01/28/2022) 14 tablet 0  ? ?No facility-administered medications prior to visit.  ? ?ROS ?See HPI ? ?Objective:  ?BP 136/90 (BP Location: Left Arm, Cuff Size: Large)   Pulse 80   Temp (!) 97.5 ?F (36.4 ?C) (Temporal)   Ht 5\' 8"  (1.727 m)   Wt 269 lb 3.2 oz (122.1 kg)   SpO2 98%   BMI 40.93 kg/m?  ? ?Physical Exam ?Cardiovascular:  ?   Pulses: Normal pulses.  ?Pulmonary:  ?   Effort: Pulmonary effort is normal.  ?Abdominal:  ?   General: There is no distension.  ?   Palpations: Abdomen is soft.  ?   Tenderness: There is no abdominal tenderness. There is no guarding.  ?Musculoskeletal:  ?   Thoracic back: Normal.  ?   Lumbar back: Spasms and tenderness present. No swelling, deformity or bony tenderness. Decreased range of motion. Negative right straight leg raise test and negative left straight leg raise test.  ?   Right lower leg: No edema.  ?   Left lower leg: No edema.  ?Skin: ?   Findings: No erythema or rash.  ?  Neurological:  ?   Mental Status: She is alert and oriented to person, place, and time.  ?Psychiatric:     ?   Mood and Affect: Mood normal.     ?   Behavior: Behavior normal.  ? ?Assessment & Plan:  ?This visit occurred during the SARS-CoV-2 public health emergency.  Safety protocols were in place, including screening questions prior to the visit, additional usage of staff PPE, and extensive cleaning of exam room while observing appropriate contact time as indicated for disinfecting solutions.  ? ?[name] was seen today for acute visit. ? ?Diagnoses and all orders for this visit: ? ?Chronic right-sided low back pain with right-sided  sciatica ?-     ibuprofen (ADVIL) 600 MG tablet; Take 1 tablet (600 mg total) by mouth 2 (two) times daily. With food ?-     DG Lumbar Spine Complete ?-     POCT urine pregnancy ? ?Class 3 severe obesity due to excess calories with serious comorbidity and body mass index (BMI) of 40.0 to 44.9 in adult [facility]) ?-     Amb Referral to Bariatric Surgery ? ?Complete urine pregnancy prior to x-ray ?Discussed the importance of proper body mechanics to avoid repeated back injury.  ?Take ibuprofen 600mg  BID with food ?Continue muscle relaxant at bedtime. ?Provided worknote for work restrictions in next 2weeks. ? ?Problem List Items Addressed This Visit   ? ?  ? Nervous and Auditory  ? Chronic right-sided low back pain with right-sided sciatica - Primary  ? Relevant Medications  ? ibuprofen (ADVIL) 600 MG tablet  ? Other Relevant Orders  ? DG Lumbar Spine Complete  ? POCT urine pregnancy (Completed)  ?  ? Other  ? Class 3 severe obesity due to excess calories with serious comorbidity and body mass index (BMI) of 40.0 to 44.9 in adult [facility])  ? Relevant Orders  ? Amb Referral to Bariatric Surgery  ?  ?Follow-up: Return in about 2 weeks (around 02/11/2022) for HTN and menorrhagia. ? ?Wilfred Lacy, NP ?

## 2022-02-12 ENCOUNTER — Ambulatory Visit: Payer: BC Managed Care – PPO | Admitting: Nurse Practitioner

## 2022-03-05 ENCOUNTER — Ambulatory Visit: Payer: BC Managed Care – PPO | Admitting: Nurse Practitioner

## 2022-03-12 ENCOUNTER — Ambulatory Visit (INDEPENDENT_AMBULATORY_CARE_PROVIDER_SITE_OTHER): Payer: BC Managed Care – PPO | Admitting: Nurse Practitioner

## 2022-03-12 VITALS — BP 134/80 | HR 83 | Temp 97.0°F | Ht 68.0 in | Wt 267.8 lb

## 2022-03-12 DIAGNOSIS — I10 Essential (primary) hypertension: Secondary | ICD-10-CM

## 2022-03-12 DIAGNOSIS — R7303 Prediabetes: Secondary | ICD-10-CM | POA: Diagnosis not present

## 2022-03-12 DIAGNOSIS — Z6841 Body Mass Index (BMI) 40.0 and over, adult: Secondary | ICD-10-CM

## 2022-03-12 DIAGNOSIS — E876 Hypokalemia: Secondary | ICD-10-CM

## 2022-03-12 DIAGNOSIS — E782 Mixed hyperlipidemia: Secondary | ICD-10-CM

## 2022-03-12 DIAGNOSIS — E781 Pure hyperglyceridemia: Secondary | ICD-10-CM | POA: Diagnosis not present

## 2022-03-12 DIAGNOSIS — F39 Unspecified mood [affective] disorder: Secondary | ICD-10-CM

## 2022-03-12 MED ORDER — OZEMPIC (0.25 OR 0.5 MG/DOSE) 2 MG/1.5ML ~~LOC~~ SOPN: PEN_INJECTOR | SUBCUTANEOUS | 0 refills | Status: DC

## 2022-03-12 NOTE — Assessment & Plan Note (Signed)
She decided not to proceed with bariatric surgery. She will prefer to use the GLP-1 injection. We discussed possible side effects and contraindications. Advised about the importance if dietary modification and regular exercise in combination to She verbalized understanding. ?Jessica Greene is not covered by her insurance ? ?Repeat hgbA1c, CMP, and lipid panel ?Sent Ozempic ?F/up in 72month ?

## 2022-03-12 NOTE — Assessment & Plan Note (Signed)
BP at goal with HCTZ ?Wt Readings from Last 3 Encounters:  ?03/12/22 267 lb 12.8 oz (121.5 kg)  ?01/28/22 269 lb 3.2 oz (122.1 kg)  ?08/13/21 261 lb 6.4 oz (118.6 kg)  ? ?Repeat CMP ?Maintain med dose ?

## 2022-03-12 NOTE — Patient Instructions (Signed)
Go to lab. ? ?How to Increase Your Level of Physical Activity ?Getting regular physical activity is important for your overall health and well-being. Most people do not get enough exercise. There are easy ways to increase your level of physical activity, even if you have not been very active in the past or if you are just starting out. ?What are the benefits of physical activity? ?Physical activity has many short-term and long-term benefits. Being active on a regular basis can improve your physical and mental health as well as provide other benefits. ?Physical health benefits ?Helping you lose weight or maintain a healthy weight. ?Strengthening your muscles and bones. ?Reducing your risk of certain long-term (chronic) diseases, including heart disease, cancer, and diabetes. ?Being able to move around more easily and for longer periods of time without getting tired (increased endurance or stamina). ?Improving your ability to fight off illness (enhanced immunity). ?Being able to sleep better. ?Helping you stay healthy as you get older, including: ?Helping you stay mobile, or capable of walking and moving around. ?Preventing accidents, such as falls. ?Increasing life expectancy. ?Mental health benefits ?Boosting your mood and improving your self-esteem. ?Lowering your chance of having mental health problems, such as depression or anxiety. ?Helping you feel good about your body. ?Other benefits ?Finding new sources of fun and enjoyment. ?Meeting new people who share a common interest. ?Before you begin ?If you have a chronic illness or have not been active for a while, check with your health care provider about how to get started. Ask your health care provider what activities are safe for you. ?Start out slowly. Walking or doing some simple chair exercises is a good place to start, especially if you have not been active before or for a long time. ?Set goals that you can work toward. Ask your health care provider how  much exercise is best for you. In general, most adults should: ?Do moderate-intensity exercise for at least 150 minutes each week (30 minutes on most days of the week) or vigorous exercise for at least 75 minutes each week, or a combination of these. ?Moderate-intensity exercise can include walking at a quick pace, biking, yoga, water aerobics, or gardening. ?Vigorous exercise involves activities that take more effort, such as jogging or running, playing sports, swimming laps, or jumping rope. ?Do strength exercises on at least 2 days each week. This can include weight lifting, body weight exercises, and resistance-band exercises. ?How to be more physically active ?Make a plan ? ?Try to find activities that you enjoy. You are more likely to commit to an exercise routine if it does not feel like a chore. ?If you have bone or joint problems, choose low-impact exercises, like walking or swimming. ?Use these tips for being successful with an exercise plan: ?Find a workout partner for accountability. ?Join a group or class, such as an aerobics class, cycling class, or sports team. ?Make family time active. Go for a walk, bike, or swim. ?Include a variety of exercises each week. ?Consider using a fitness tracker, such as a mobile phone app or a device worn like a watch, that will count the number of steps you take each day. Many people strive to reach 10,000 steps a day. ?Find ways to be active in your daily routines ?Besides your formal exercise plans, you can find ways to do physical activity during your daily routines, such as: ?Walking or biking to work or to the store. ?Taking the stairs instead of the elevator. ?Parking farther away from  the door at work or at the store. ?Planning walking meetings. ?Walking around while you are on the phone. ?Where to find more information ?Centers for Disease Control and Prevention: CampusCasting.com.pt ?President's [employer] on [employer] Sports & Nutrition:  www.fitness.gov ?ChooseMyPlate: http://www.harvey.com/ ?Contact a health care provider if: ?You have headaches, muscle aches, or joint pain that is concerning. ?You feel dizzy or light-headed while exercising. ?You faint. ?You feel your heart skipping, racing, or fluttering. ?You have chest pain while exercising. ?Summary ?Exercise benefits your mind and body at any age, even if you are just starting out. ?If you have a chronic illness or have not been active for a while, check with your health care provider before increasing your physical activity. ?Choose activities that are safe and enjoyable for you. Ask your health care provider what activities are safe for you. ?Start slowly. Tell your health care provider if you have problems as you start to increase your activity level. ?This information is not intended to replace advice given to you by your health care provider. Make sure you discuss any questions you have with your health care provider. ?Document Revised: 03/07/2021 Document Reviewed: 03/07/2021 ?Elsevier Patient Education ? 2023 Elsevier Inc. ? ?Calorie Counting for Weight Loss ?Calories are units of energy. Your body needs a certain number of calories from food to keep going throughout the day. When you eat or drink more calories than your body needs, your body stores the extra calories mostly as fat. When you eat or drink fewer calories than your body needs, your body burns fat to get the energy it needs. ?Calorie counting means keeping track of how many calories you eat and drink each day. Calorie counting can be helpful if you need to lose weight. If you eat fewer calories than your body needs, you should lose weight. Ask your health care provider what a healthy weight is for you. ?For calorie counting to work, you will need to eat the right number of calories each day to lose a healthy amount of weight per week. A dietitian can help you figure out how many calories you need in a day and will suggest ways to  reach your calorie goal. ?A healthy amount of weight to lose each week is usually 1-2 lb (0.5-0.9 kg). This usually means that your daily calorie intake should be reduced by 500-750 calories. ?Eating 1,200-1,500 calories a day can help most women lose weight. ?Eating 1,500-1,800 calories a day can help most men lose weight. ?What do I need to know about calorie counting? ?Work with your health care provider or dietitian to determine how many calories you should get each day. To meet your daily calorie goal, you will need to: ?Find out how many calories are in each food that you would like to eat. Try to do this before you eat. ?Decide how much of the food you plan to eat. ?Keep a food log. Do this by writing down what you ate and how many calories it had. ?To successfully lose weight, it is important to balance calorie counting with a healthy lifestyle that includes regular activity. ?Where do I find calorie information? ? ?The number of calories in a food can be found on a Nutrition Facts label. If a food does not have a Nutrition Facts label, try to look up the calories online or ask your dietitian for help. ?Remember that calories are listed per serving. If you choose to have more than one serving of a food, you will have to  multiply the calories per serving by the number of servings you plan to eat. For example, the label on a package of bread might say that a serving size is 1 slice and that there are 90 calories in a serving. If you eat 1 slice, you will have eaten 90 calories. If you eat 2 slices, you will have eaten 180 calories. ?How do I keep a food log? ?After each time that you eat, record the following in your food log as soon as possible: ?What you ate. Be sure to include toppings, sauces, and other extras on the food. ?How much you ate. This can be measured in cups, ounces, or number of items. ?How many calories were in each food and drink. ?The total number of calories in the food you ate. ?Keep your  food log near you, such as in a pocket-sized notebook or on an app or website on your mobile phone. Some programs will calculate calories for you and show you how many calories you have left to meet your daily goal. ?Wh

## 2022-03-12 NOTE — Progress Notes (Signed)
? ?             Established Patient Visit ? ?Patient: Jessica Greene   DOB: 11/12/1986   36 y.o. Female  MRN: [id number] ?Visit Date: 03/12/2022 ? ?Subjective:  ?  ?Chief Complaint  ?Patient presents with  ? Follow-up  ?  2 week f/u HTN.  Hasn't been checking BP.    ? ?HPI ?Class 3 severe obesity due to excess calories with serious comorbidity and body mass index (BMI) of 40.0 to 44.9 in adult [facility]) ?She decided not to proceed with bariatric surgery. She will prefer to use the GLP-1 injection. We discussed possible side effects and contraindications. Advised about the importance if dietary modification and regular exercise in combination to She verbalized understanding. ?Mancel Parsons is not covered by her insurance ? ?Repeat hgbA1c, CMP, and lipid panel ?Sent Ozempic ?F/up in 21month ? ?HTN (hypertension) ?BP at goal with HCTZ ?Wt Readings from Last 3 Encounters:  ?03/12/22 267 lb 12.8 oz (121.5 kg)  ?01/28/22 269 lb 3.2 oz (122.1 kg)  ?08/13/21 261 lb 6.4 oz (118.6 kg)  ? ?Repeat CMP ?Maintain med dose ? ?Reviewed medical, surgical, and social history today ? ?Medications: ?Outpatient Medications Prior to Visit  ?Medication Sig  ? hydrochlorothiazide (MICROZIDE) 12.5 MG capsule Take 1 capsule (12.5 mg total) by mouth daily.  ? ibuprofen (ADVIL) 600 MG tablet Take 1 tablet (600 mg total) by mouth 2 (two) times daily. With food  ? potassium chloride SA (KLOR-CON M) 20 MEQ tablet Take 1 tablet (20 mEq total) by mouth 2 (two) times daily.  ? tiZANidine (ZANAFLEX) 4 MG tablet Take 1 tablet (4 mg total) by mouth every 8 (eight) hours as needed for muscle spasms.  ? ?No facility-administered medications prior to visit.  ? ?Reviewed past medical and social history.  ? ?ROS per HPI above ? ? ?   ?Objective:  ?BP 134/80 (BP Location: Left Arm, Patient Position: Sitting, Cuff Size: Normal)   Pulse 83   Temp (!) 97 ?F (36.1 ?C) (Temporal)   Ht 5\' 8"  (1.727 m)   Wt 267 lb 12.8 oz (121.5 kg)   SpO2 97%   BMI 40.72 kg/m?  ? ?   ? ?Physical Exam ?Cardiovascular:  ?   Rate and Rhythm: Normal rate and regular rhythm.  ?   Pulses: Normal pulses.  ?   Heart sounds: Normal heart sounds.  ?Pulmonary:  ?   Effort: Pulmonary effort is normal.  ?   Breath sounds: Normal breath sounds.  ?Musculoskeletal:  ?   Right lower leg: No edema.  ?   Left lower leg: No edema.  ?Neurological:  ?   Mental Status: She is alert and oriented to person, place, and time.  ?  ?No results found for any visits on 03/12/22. ?   ?Assessment & Plan:  ?  ?Problem List Items Addressed This Visit   ? ?  ? Cardiovascular and Mediastinum  ? HTN (hypertension) - Primary  ?  BP at goal with HCTZ ?Wt Readings from Last 3 Encounters:  ?03/12/22 267 lb 12.8 oz (121.5 kg)  ?01/28/22 269 lb 3.2 oz (122.1 kg)  ?08/13/21 261 lb 6.4 oz (118.6 kg)  ? ?Repeat CMP ?Maintain med dose ?  ?  ? Relevant Orders  ? Comprehensive metabolic panel  ?  ? Other  ? Class 3 severe obesity due to excess calories with serious comorbidity and body mass index (BMI) of 40.0 to 44.9 in adult [facility])  ?  She  decided not to proceed with bariatric surgery. She will prefer to use the GLP-1 injection. We discussed possible side effects and contraindications. Advised about the importance if dietary modification and regular exercise in combination to She verbalized understanding. ?Mancel Parsons is not covered by her insurance ? ?Repeat hgbA1c, CMP, and lipid panel ?Sent Ozempic ?F/up in 35month ? ?  ?  ? Relevant Medications  ? Semaglutide,0.25 or 0.5MG /DOS, (OZEMPIC, 0.25 OR 0.5 MG/DOSE,) 2 MG/1.5ML SOPN  ? Mixed hyperlipidemia  ? Relevant Orders  ? Comprehensive metabolic panel  ? Lipid panel  ? Mood disorder ([place])  ? Prediabetes  ? Relevant Medications  ? Semaglutide,0.25 or 0.5MG /DOS, (OZEMPIC, 0.25 OR 0.5 MG/DOSE,) 2 MG/1.5ML SOPN  ? Other Relevant Orders  ? Hemoglobin A1c  ? ?Return in about 4 weeks (around 04/09/2022) for weight management. ? ?  ? ?Wilfred Lacy, NP ? ? ?

## 2022-03-13 LAB — LIPID PANEL
Cholesterol: 221 mg/dL — ABNORMAL HIGH (ref 0–200)
HDL: 28.9 mg/dL — ABNORMAL LOW (ref >39.00)
NonHDL: 191.62
Total CHOL/HDL Ratio: 8
Triglycerides: 352 mg/dL — ABNORMAL HIGH (ref 0.0–149.0)
VLDL: 70.4 mg/dL — ABNORMAL HIGH (ref 0.0–40.0)

## 2022-03-13 LAB — COMPREHENSIVE METABOLIC PANEL
ALT: 20 U/L (ref 0–35)
AST: 24 U/L (ref 0–37)
Albumin: 4.2 g/dL (ref 3.5–5.2)
Alkaline Phosphatase: 62 U/L (ref 39–117)
BUN: 7 mg/dL (ref 6–23)
CO2: 26 mEq/L (ref 19–32)
Calcium: 8.8 mg/dL (ref 8.4–10.5)
Chloride: 104 mEq/L (ref 96–112)
Creatinine, Ser: 0.55 mg/dL (ref 0.40–1.20)
GFR: 117.61 mL/min (ref >60.00)
Glucose, Bld: 120 mg/dL — ABNORMAL HIGH (ref 70–99)
Potassium: 3.2 mEq/L — ABNORMAL LOW (ref 3.5–5.1)
Sodium: 140 mEq/L (ref 135–145)
Total Bilirubin: 0.2 mg/dL (ref 0.2–1.2)
Total Protein: 7 g/dL (ref 6.0–8.3)

## 2022-03-13 LAB — HEMOGLOBIN A1C: Hgb A1c MFr Bld: 6.2 % (ref 4.6–6.5)

## 2022-03-13 LAB — LDL CHOLESTEROL, DIRECT: Direct LDL: 143 mg/dL

## 2022-03-15 ENCOUNTER — Encounter: Payer: Self-pay | Admitting: Nurse Practitioner

## 2022-03-15 MED ORDER — POTASSIUM CHLORIDE CRYS ER 20 MEQ PO TBCR: 20.0000 meq | EXTENDED_RELEASE_TABLET | Freq: Two times a day (BID) | ORAL | 1 refills | Status: DC

## 2022-03-15 MED ORDER — FENOFIBRATE 145 MG PO TABS: 145.0000 mg | ORAL_TABLET | Freq: Every day | ORAL | 1 refills | Status: DC

## 2022-03-15 MED ORDER — HYDROCHLOROTHIAZIDE 12.5 MG PO CAPS: 12.5000 mg | ORAL_CAPSULE | Freq: Every day | ORAL | 1 refills | Status: DC

## 2022-04-09 ENCOUNTER — Ambulatory Visit: Payer: BC Managed Care – PPO | Admitting: Nurse Practitioner

## 2022-04-15 ENCOUNTER — Other Ambulatory Visit (HOSPITAL_COMMUNITY)
Admission: RE | Admit: 2022-04-15 | Discharge: 2022-04-15 | Disposition: A | Discharge status: Home / Self Care | Payer: BC Managed Care – PPO | Source: Ambulatory Visit | Attending: Nurse Practitioner | Admitting: Nurse Practitioner

## 2022-04-15 ENCOUNTER — Ambulatory Visit (INDEPENDENT_AMBULATORY_CARE_PROVIDER_SITE_OTHER): Payer: BC Managed Care – PPO | Admitting: Nurse Practitioner

## 2022-04-15 ENCOUNTER — Encounter: Payer: Self-pay | Admitting: Nurse Practitioner

## 2022-04-15 VITALS — BP 128/88 | HR 74 | Temp 97.3°F | Ht 68.0 in | Wt 264.0 lb

## 2022-04-15 DIAGNOSIS — R7303 Prediabetes: Secondary | ICD-10-CM

## 2022-04-15 DIAGNOSIS — Z6841 Body Mass Index (BMI) 40.0 and over, adult: Secondary | ICD-10-CM

## 2022-04-15 DIAGNOSIS — N760 Acute vaginitis: Secondary | ICD-10-CM

## 2022-04-15 DIAGNOSIS — N912 Amenorrhea, unspecified: Secondary | ICD-10-CM

## 2022-04-15 LAB — POCT URINE PREGNANCY: Preg Test, Ur: NEGATIVE

## 2022-04-15 MED ORDER — OZEMPIC (0.25 OR 0.5 MG/DOSE) 2 MG/1.5ML ~~LOC~~ SOPN: 0.5000 mg | PEN_INJECTOR | SUBCUTANEOUS | 0 refills | Status: DC

## 2022-04-15 MED ORDER — OZEMPIC (1 MG/DOSE) 4 MG/3ML ~~LOC~~ SOPN: 1.0000 mg | PEN_INJECTOR | SUBCUTANEOUS | 0 refills | Status: DC

## 2022-04-15 NOTE — Progress Notes (Signed)
Established Patient Visit  Patient: Jessica Greene   DOB: [birth year]   36 y.o. Female  MRN: [id number] Visit Date: 04/15/2022  Subjective:    Chief Complaint  Patient presents with   Office Visit    F/u Weight Management Needs refills for needles Walks 3 times a week & drinking more water  Self swab for STD.   Vaginal Discharge The patient's primary symptoms include genital itching, a genital odor, missed menses and vaginal discharge. The patient's pertinent negatives include no genital lesions, genital rash, pelvic pain or vaginal bleeding. This is a new problem. The current episode started in the past 7 days. The problem occurs constantly. The problem has been unchanged. The patient is experiencing no pain. She is not pregnant. Pertinent negatives include no abdominal pain, constipation, dysuria, fever, frequency, painful intercourse, rash or urgency. The vaginal discharge was yellow. There has been no bleeding. She is sexually active. No, her partner does not have an STD. She uses nothing for contraception. Her menstrual history has been irregular. Her past medical history is significant for metrorrhagia and vaginosis. There is no history of PID or an STD.  Class 3 severe obesity due to excess calories with serious comorbidity and body mass index (BMI) of 40.0 to 44.9 in adult [facility]) Lost 5lbs in last 4weeks with ozempic 0.25mg  x 2weeks.  No adverse side effects Exercise: walking 3x/week. Diet: does not skip meals, increased oral hydration, low fat/low carb. BP Readings from Last 3 Encounters:  04/15/22 128/88  03/12/22 134/80  01/28/22 136/90   Wt Readings from Last 3 Encounters:  04/15/22 264 lb (119.7 kg)  03/12/22 267 lb 12.8 oz (121.5 kg)  01/28/22 269 lb 3.2 oz (122.1 kg)   Increase ozempic dose to 0.5mg  weekly x 4weeks, then 1mg  weekly till next appt. Encourage to maintain exercise regimen and heart healthy diet F/up in 6-8weeks   Reviewed  medical, surgical, and social history today  Medications: Outpatient Medications Prior to Visit  Medication Sig   fenofibrate (TRICOR) 145 MG tablet Take 1 tablet (145 mg total) by mouth daily.   hydrochlorothiazide (MICROZIDE) 12.5 MG capsule Take 1 capsule (12.5 mg total) by mouth daily.   potassium chloride SA (KLOR-CON M) 20 MEQ tablet Take 1 tablet (20 mEq total) by mouth 2 (two) times daily. BID x2days, then resume BID.   [DISCONTINUED] ibuprofen (ADVIL) 600 MG tablet Take 1 tablet (600 mg total) by mouth 2 (two) times daily. With food (Patient not taking: Reported on 04/15/2022)   [DISCONTINUED] Semaglutide,0.25 or 0.5MG /DOS, (OZEMPIC, 0.25 OR 0.5 MG/DOSE,) 2 MG/1.5ML SOPN 0.25mg  weekly x2weeks, then 0.5mg  weekly continuously (Patient not taking: Reported on 04/15/2022)   [DISCONTINUED] tiZANidine (ZANAFLEX) 4 MG tablet Take 1 tablet (4 mg total) by mouth every 8 (eight) hours as needed for muscle spasms. (Patient not taking: Reported on 04/15/2022)   No facility-administered medications prior to visit.   Reviewed past medical and social history.   ROS per HPI above      Objective:  BP 128/88 (BP Location: Right Arm, Patient Position: Sitting, Cuff Size: Normal)   Pulse 74   Temp (!) 97.3 F (36.3 C) (Temporal)   Ht 5\' 8"  (1.727 m)   Wt 264 lb (119.7 kg)   SpO2 98%   BMI 40.14 kg/m      Physical Exam Constitutional:      Appearance: She is obese.  Genitourinary:  Comments: She opted to self swab. Neurological:     Mental Status: She is alert and oriented to person, place, and time.  Psychiatric:        Mood and Affect: Mood normal.        Behavior: Behavior normal.        Thought Content: Thought content normal.    Results for orders placed or performed in visit on 04/15/22  POCT urine pregnancy  Result Value Ref Range   Preg Test, Ur Negative Negative      Assessment & Plan:    Problem List Items Addressed This Visit       Other   Class 3  severe obesity due to excess calories with serious comorbidity and body mass index (BMI) of 40.0 to 44.9 in adult [facility])    Lost 5lbs in last 4weeks with ozempic 0.25mg  x 2weeks.  No adverse side effects Exercise: walking 3x/week. Diet: does not skip meals, increased oral hydration, low fat/low carb. BP Readings from Last 3 Encounters:  04/15/22 128/88  03/12/22 134/80  01/28/22 136/90   Wt Readings from Last 3 Encounters:  04/15/22 264 lb (119.7 kg)  03/12/22 267 lb 12.8 oz (121.5 kg)  01/28/22 269 lb 3.2 oz (122.1 kg)   Increase ozempic dose to 0.5mg  weekly x 4weeks, then 1mg  weekly till next appt. Encourage to maintain exercise regimen and heart healthy diet F/up in 6-8weeks      Relevant Medications   Semaglutide,0.25 or 0.5MG /DOS, (OZEMPIC, 0.25 OR 0.5 MG/DOSE,) 2 MG/1.5ML SOPN   Semaglutide, 1 MG/DOSE, (OZEMPIC, 1 MG/DOSE,) 4 MG/3ML SOPN (Start on 05/18/2022)   Prediabetes   Relevant Medications   Semaglutide,0.25 or 0.5MG /DOS, (OZEMPIC, 0.25 OR 0.5 MG/DOSE,) 2 MG/1.5ML SOPN   Semaglutide, 1 MG/DOSE, (OZEMPIC, 1 MG/DOSE,) 4 MG/3ML SOPN (Start on 05/18/2022)   Other Visit Diagnoses     Acute vaginitis    -  Primary   Relevant Orders   Cervicovaginal ancillary only( [place])   Amenorrhea       Relevant Orders   POCT urine pregnancy (Completed)      Return in about 8 weeks (around 06/10/2022) for weight management.     06/12/2022, NP

## 2022-04-15 NOTE — Assessment & Plan Note (Addendum)
Lost 5lbs in last 4weeks with ozempic 0.25mg  x 2weeks.  No adverse side effects Exercise: walking 3x/week. Diet: does not skip meals, increased oral hydration, low fat/low carb. BP Readings from Last 3 Encounters:  04/15/22 128/88  03/12/22 134/80  01/28/22 136/90   Wt Readings from Last 3 Encounters:  04/15/22 264 lb (119.7 kg)  03/12/22 267 lb 12.8 oz (121.5 kg)  01/28/22 269 lb 3.2 oz (122.1 kg)   Increase ozempic dose to 0.5mg  weekly x 4weeks, then 1mg  weekly till next appt. Encourage to maintain exercise regimen and heart healthy diet F/up in 6-8weeks

## 2022-04-15 NOTE — Patient Instructions (Addendum)
Increase ozempic dose to 0.5mg  weekly x 4weeks, then 1mg  weekly till next appt. Continue heart healthy diet and regular exercise.

## 2022-04-17 LAB — CERVICOVAGINAL ANCILLARY ONLY
Bacterial Vaginitis (gardnerella): NEGATIVE
Candida Glabrata: NEGATIVE
Candida Vaginitis: NEGATIVE
Chlamydia: NEGATIVE
Comment: NEGATIVE
Comment: NEGATIVE
Comment: NEGATIVE
Comment: NEGATIVE
Comment: NEGATIVE
Comment: NORMAL
Neisseria Gonorrhea: NEGATIVE
Trichomonas: NEGATIVE

## 2022-04-23 DIAGNOSIS — Z419 Encounter for procedure for purposes other than remedying health state, unspecified: Secondary | ICD-10-CM | POA: Diagnosis not present

## 2022-05-05 ENCOUNTER — Other Ambulatory Visit: Payer: Self-pay | Admitting: Nurse Practitioner

## 2022-05-05 DIAGNOSIS — R7303 Prediabetes: Secondary | ICD-10-CM

## 2022-05-05 NOTE — Telephone Encounter (Signed)
Chart supports Rx Last OV: 03/2022 Next OV: 05/2022 

## 2022-05-08 ENCOUNTER — Encounter: Payer: Self-pay | Admitting: Nurse Practitioner

## 2022-05-08 DIAGNOSIS — R7303 Prediabetes: Secondary | ICD-10-CM

## 2022-05-19 MED ORDER — OZEMPIC (1 MG/DOSE) 4 MG/3ML ~~LOC~~ SOPN: 1.0000 mg | PEN_INJECTOR | SUBCUTANEOUS | 0 refills | Status: DC

## 2022-06-10 ENCOUNTER — Other Ambulatory Visit: Payer: Self-pay | Admitting: Nurse Practitioner

## 2022-06-10 DIAGNOSIS — E66813 Obesity, class 3: Secondary | ICD-10-CM

## 2022-06-10 DIAGNOSIS — R7303 Prediabetes: Secondary | ICD-10-CM

## 2022-06-17 ENCOUNTER — Encounter: Payer: Self-pay | Admitting: Nurse Practitioner

## 2022-06-17 ENCOUNTER — Ambulatory Visit: Payer: BC Managed Care – PPO | Admitting: Nurse Practitioner

## 2022-06-17 DIAGNOSIS — R7303 Prediabetes: Secondary | ICD-10-CM | POA: Diagnosis not present

## 2022-06-17 DIAGNOSIS — Z6841 Body Mass Index (BMI) 40.0 and over, adult: Secondary | ICD-10-CM | POA: Diagnosis not present

## 2022-06-17 DIAGNOSIS — E782 Mixed hyperlipidemia: Secondary | ICD-10-CM | POA: Diagnosis not present

## 2022-06-17 LAB — HEMOGLOBIN A1C: Hgb A1c MFr Bld: 6 % (ref 4.6–6.5)

## 2022-06-17 LAB — BASIC METABOLIC PANEL
BUN: 8 mg/dL (ref 6–23)
CO2: 28 mEq/L (ref 19–32)
Calcium: 8.9 mg/dL (ref 8.4–10.5)
Chloride: 105 mEq/L (ref 96–112)
Creatinine, Ser: 0.63 mg/dL (ref 0.40–1.20)
GFR: 113.61 mL/min (ref >60.00)
Glucose, Bld: 93 mg/dL (ref 70–99)
Potassium: 3.2 mEq/L — ABNORMAL LOW (ref 3.5–5.1)
Sodium: 138 mEq/L (ref 135–145)

## 2022-06-17 LAB — LDL CHOLESTEROL, DIRECT: Direct LDL: 127 mg/dL

## 2022-06-17 MED ORDER — WEGOVY 1.7 MG/0.75ML ~~LOC~~ SOAJ: 1.7000 mg | SUBCUTANEOUS | 0 refills | Status: DC

## 2022-06-17 NOTE — Progress Notes (Signed)
Established Patient Visit  Patient: Jessica Greene   DOB: 03-Jun-1986   36 y.o. Female  MRN: [id number] Visit Date: 06/17/2022  Subjective:    Chief Complaint  Patient presents with   Office Visit    F/u on weight management C/o upset stomach while taking ozempic No other concerns    HPI Class 3 severe obesity due to excess calories with serious comorbidity and body mass index (BMI) of 40.0 to 44.9 in adult [facility]) Lost 3lbs in last 37month with ozempic 0.5mg . She reports nausea and constipation.  She insurance declined to cover 1mg  dose. Exercise: walking 3x/week, each day. Diet: admits she is not consistent Wt Readings from Last 3 Encounters:  04/15/22 264 lb (119.7 kg)  03/12/22 267 lb 12.8 oz (121.5 kg)  01/28/22 269 lb 3.2 oz (122.1 kg)   Switched ozempic 0.5mg  to wegovy 1.7mg . advised about need for PA. Due to cost, she [facility]. Advised to use miralax, high fiber diet and adequate oral hydration to manage constipation. Repeat LDL, hgbA1c and BMP F/up in 37month  Prediabetes Repeat hgbA1c Advised to maintain low carb/high fiber/ high protein diet and daily exercise  Reviewed medical, surgical, and social history today  Medications: Outpatient Medications Prior to Visit  Medication Sig   fenofibrate (TRICOR) 145 MG tablet Take 1 tablet (145 mg total) by mouth daily.   hydrochlorothiazide (MICROZIDE) 12.5 MG capsule Take 1 capsule (12.5 mg total) by mouth daily.   potassium chloride SA (KLOR-CON M) 20 MEQ tablet Take 1 tablet (20 mEq total) by mouth 2 (two) times daily. 2month BID x2days, then resume BID.   [DISCONTINUED] Semaglutide, 1 MG/DOSE, (OZEMPIC, 1 MG/DOSE,) 4 MG/3ML SOPN Inject 1 mg into the skin once a week.   No facility-administered medications prior to visit.   Reviewed past medical and social history.   ROS per HPI above      Objective:  BP 122/88 (BP Location: Right Arm, Patient  Position: Sitting, Cuff Size: Normal)   Pulse 70   Temp (!) 96.5 F (35.8 C) (Temporal)   Ht 5\' 8"  (1.727 m)   Wt 249 lb 9.6 oz (113.2 kg)   SpO2 99%   BMI 37.95 kg/m      Physical Exam Cardiovascular:     Rate and Rhythm: Normal rate.     Pulses: Normal pulses.  Pulmonary:     Effort: Pulmonary effort is normal.  Abdominal:     General: There is no distension.     Palpations: Abdomen is soft.     Tenderness: There is no abdominal tenderness.  Neurological:     Mental Status: She is alert and oriented to person, place, and time.     No results found for any visits on 06/17/22.    Assessment & Plan:    Problem List Items Addressed This Visit       Other   Class 3 severe obesity due to excess calories with serious comorbidity and body mass index (BMI) of 40.0 to 44.9 in adult [facility]) - Primary    Lost 3lbs in last 37month with ozempic 0.5mg . She reports nausea and constipation.  She insurance declined to cover 1mg  dose. Exercise: walking 3x/week, [facility] each day. Diet: admits she is not consistent Wt Readings from Last 3 Encounters:  04/15/22 264 lb (119.7 kg)  03/12/22 267 lb 12.8 oz (121.5 kg)  01/28/22  269 lb 3.2 oz (122.1 kg)   Switched ozempic 0.5mg  to wegovy 1.7mg . advised about need for PA. Due to cost, she agreed [facility]. Advised to use miralax, high fiber diet and adequate oral hydration to manage constipation. Repeat LDL, hgbA1c and BMP F/up in 37month      Relevant Medications   Semaglutide-Weight Management (WEGOVY) 1.7 MG/0.75ML SOAJ   Other Relevant Orders   Hemoglobin A1c   Direct LDL   Basic metabolic panel   Mixed hyperlipidemia   Relevant Orders   Direct LDL   Prediabetes    Repeat hgbA1c Advised to maintain low carb/high fiber/ high protein diet and daily exercise      Relevant Medications   Semaglutide-Weight Management (WEGOVY) 1.7 MG/0.75ML SOAJ   Other Relevant Orders   Hemoglobin A1c   Basic metabolic  panel   Return in about 4 weeks (around 07/15/2022) for weight management.     Alysia Penna, NP

## 2022-06-17 NOTE — Assessment & Plan Note (Signed)
Repeat hgbA1c Advised to maintain low carb/high fiber/ high protein diet and daily exercise

## 2022-06-17 NOTE — Assessment & Plan Note (Addendum)
Lost 3lbs in last 32month with ozempic 0.5mg . She reports nausea and constipation.  She insurance declined to cover 1mg  dose. Exercise: walking 3x/week, each day. Diet: admits she is not consistent Wt Readings from Last 3 Encounters:  04/15/22 264 lb (119.7 kg)  03/12/22 267 lb 12.8 oz (121.5 kg)  01/28/22 269 lb 3.2 oz (122.1 kg)   Switched ozempic 0.5mg  to wegovy 1.7mg . advised about need for PA. Due to cost, she agreed [facility]. Advised to use miralax, high fiber diet and adequate oral hydration to manage constipation. Repeat LDL, hgbA1c and BMP F/up in 95month

## 2022-06-17 NOTE — Patient Instructions (Addendum)
Use miralax 17g once day to help with constipation. Stop ozempic Start wegovy. Will be sent through mail pharmacy once PA is completed. Maintain high fiber diet, adequate oral hydration and daily exercise. Go to lab

## 2022-07-01 ENCOUNTER — Telehealth: Payer: Self-pay | Admitting: Nurse Practitioner

## 2022-07-01 NOTE — Telephone Encounter (Signed)
Caller Name: [employer]Meds Call back phone #: [phone]  Reason for Call: PA is needed for [facility] for pt. Asked for a call back, reference #[id number]

## 2022-07-01 NOTE — Telephone Encounter (Signed)
Called pt to adv her PA has been approved. Pt does not have a VM set up as of yet. Will attempt to try again.

## 2022-07-02 NOTE — Telephone Encounter (Signed)
Pt advised of PA approval for her Wegovy.

## 2022-07-15 ENCOUNTER — Ambulatory Visit: Payer: BC Managed Care – PPO | Admitting: Nurse Practitioner

## 2022-07-18 ENCOUNTER — Encounter (HOSPITAL_COMMUNITY): Payer: Self-pay | Admitting: *Deleted

## 2022-07-18 ENCOUNTER — Other Ambulatory Visit: Payer: Self-pay

## 2022-07-18 ENCOUNTER — Emergency Department (HOSPITAL_COMMUNITY)
Admission: EM | Admit: 2022-07-18 | Discharge: 2022-07-19 | Disposition: A | Discharge status: Home / Self Care | Payer: BC Managed Care – PPO | Attending: Emergency Medicine | Admitting: Emergency Medicine

## 2022-07-18 DIAGNOSIS — Z79899 Other long term (current) drug therapy: Secondary | ICD-10-CM | POA: Diagnosis not present

## 2022-07-18 DIAGNOSIS — E119 Type 2 diabetes mellitus without complications: Secondary | ICD-10-CM | POA: Insufficient documentation

## 2022-07-18 DIAGNOSIS — R112 Nausea with vomiting, unspecified: Secondary | ICD-10-CM | POA: Insufficient documentation

## 2022-07-18 DIAGNOSIS — I10 Essential (primary) hypertension: Secondary | ICD-10-CM | POA: Diagnosis not present

## 2022-07-18 HISTORY — DX: Type 2 diabetes mellitus without complications: E11.9

## 2022-07-18 NOTE — ED Triage Notes (Signed)
Pt started on [facility] yesterday, pt has had nausea, vomiting, dizziness, abdominal pain.

## 2022-07-19 LAB — CBC
HCT: 41 % (ref 36.0–46.0)
Hemoglobin: 13.7 g/dL (ref 12.0–15.0)
MCH: 31.4 pg (ref 26.0–34.0)
MCHC: 33.4 g/dL (ref 30.0–36.0)
MCV: 94 fL (ref 80.0–100.0)
Platelets: 302 10*3/uL (ref 150–400)
RBC: 4.36 MIL/uL (ref 3.87–5.11)
RDW: 13.6 % (ref 11.5–15.5)
WBC: 7.3 10*3/uL (ref 4.0–10.5)
nRBC: 0 % (ref 0.0–0.2)

## 2022-07-19 LAB — URINALYSIS, ROUTINE W REFLEX MICROSCOPIC
Bilirubin Urine: NEGATIVE
Glucose, UA: NEGATIVE mg/dL
Hgb urine dipstick: NEGATIVE
Ketones, ur: NEGATIVE mg/dL
Leukocytes,Ua: NEGATIVE
Nitrite: NEGATIVE
Protein, ur: 30 mg/dL — AB
Specific Gravity, Urine: 1.031 — ABNORMAL HIGH (ref 1.005–1.030)
pH: 5 (ref 5.0–8.0)

## 2022-07-19 LAB — COMPREHENSIVE METABOLIC PANEL
ALT: 22 U/L (ref 0–44)
AST: 21 U/L (ref 15–41)
Albumin: 4.4 g/dL (ref 3.5–5.0)
Alkaline Phosphatase: 59 U/L (ref 38–126)
Anion gap: 10 (ref 5–15)
BUN: 10 mg/dL (ref 6–20)
CO2: 23 mmol/L (ref 22–32)
Calcium: 9.5 mg/dL (ref 8.9–10.3)
Chloride: 104 mmol/L (ref 98–111)
Creatinine, Ser: 0.86 mg/dL (ref 0.44–1.00)
GFR, Estimated: >60 mL/min (ref >60)
Glucose, Bld: 91 mg/dL (ref 70–99)
Potassium: 3.3 mmol/L — ABNORMAL LOW (ref 3.5–5.1)
Sodium: 137 mmol/L (ref 135–145)
Total Bilirubin: 0.7 mg/dL (ref 0.3–1.2)
Total Protein: 8.2 g/dL — ABNORMAL HIGH (ref 6.5–8.1)

## 2022-07-19 LAB — I-STAT BETA HCG BLOOD, ED (MC, WL, AP ONLY): I-stat hCG, quantitative: <5 m[IU]/mL (ref <5)

## 2022-07-19 LAB — LIPASE, BLOOD: Lipase: 28 U/L (ref 11–51)

## 2022-07-19 MED ORDER — ONDANSETRON HCL 4 MG PO TABS: 4.0000 mg | ORAL_TABLET | Freq: Four times a day (QID) | ORAL | 0 refills | Status: DC

## 2022-07-19 MED ORDER — ONDANSETRON HCL 4 MG/2ML IJ SOLN
4.0000 mg | Freq: Once | INTRAMUSCULAR | Status: AC
Start: 2022-07-19 — End: 2022-07-19
  Administered 2022-07-19: 4 mg via INTRAVENOUS
  Filled 2022-07-19: qty 2

## 2022-07-19 MED ORDER — ONDANSETRON 4 MG PO TBDP
8.0000 mg | ORAL_TABLET | Freq: Once | ORAL | Status: AC
  Administered 2022-07-19: 8 mg via ORAL
  Filled 2022-07-19: qty 2

## 2022-07-19 MED ORDER — SODIUM CHLORIDE 0.9 % IV BOLUS
1000.0000 mL | Freq: Once | INTRAVENOUS | Status: AC
Start: 2022-07-19 — End: 2022-07-19
  Administered 2022-07-19: 1000 mL via INTRAVENOUS

## 2022-07-19 NOTE — ED Provider Triage Note (Signed)
Emergency Medicine Provider Triage Evaluation Note  Ronica Vivian , a 36 y.o. female  was evaluated in triage.  Pt complains of abdominal pain nausea unable to tolerate p.o. started yesterday after she took her new medication Wegovy, so passing gas, has not had a bowel movement since taking Wegovy, no abdominal surgeries, making this is from medication..  Review of Systems  Positive: Nausea vomiting Negative: Fevers chills  Physical Exam  BP (!) 149/100 (BP Location: Right Arm)   Pulse 98   Temp 98.8 F (37.1 C) (Oral)   Resp 20   SpO2 97%  Gen:   Awake, no distress   Resp:  Normal effort  MSK:   Moves extremities without difficulty  Other:    Medical Decision Making  Medically screening exam initiated at 12:11 AM.  Appropriate orders placed.  Anneliese Leblond was informed that the remainder of the evaluation will be completed by another provider, this initial triage assessment does not replace that evaluation, and the importance of remaining in the ED until their evaluation is complete.  Lab work has been ordered will need further work-up.   Carroll Sage, PA-C 07/19/22 [phone]

## 2022-07-19 NOTE — ED Provider Notes (Signed)
[facility] EMERGENCY DEPARTMENT Provider Note   CSN: [id number] Arrival date & time: 07/18/22  2258     History  Chief Complaint  Patient presents with   Emesis    Jessica Greene is a 36 y.o. female.  Patient here with nausea and vomiting.  History of hypertension, high cholesterol, diabetes.  Just recently started wegovy and thinks may be related to that.  Smokes marijuana occasionally.  Feels like her stomach is turning at times.  Denies any fever, chills, weakness, numbness, chest pain, shortness of breath.  Nothing makes it worse or better.  The history is provided by the patient.       Home Medications Prior to Admission medications   Medication Sig Start Date End Date Taking? Authorizing Provider  ondansetron (ZOFRAN) 4 MG tablet Take 1 tablet (4 mg total) by mouth every 6 (six) hours. 07/19/22  Yes Danille Oppedisano, DO  fenofibrate (TRICOR) 145 MG tablet Take 1 tablet (145 mg total) by mouth daily. 03/15/22   Nche, Bonna Gains, NP  hydrochlorothiazide (MICROZIDE) 12.5 MG capsule Take 1 capsule (12.5 mg total) by mouth daily. 03/15/22   Nche, Bonna Gains, NP  potassium chloride SA (KLOR-CON M) 20 MEQ tablet Take 1 tablet (20 mEq total) by mouth 2 (two) times daily. BID x2days, then resume BID. 03/15/22   Nche, Bonna Gains, NP  Semaglutide-Weight Management (WEGOVY) 1.7 MG/0.75ML SOAJ Inject 1.7 mg into the skin once a week. 06/17/22   Nche, Bonna Gains, NP      Allergies    Patient has no known allergies.    Review of Systems   Review of Systems  Physical Exam Updated Vital Signs BP 122/78 (BP Location: Right Arm)   Pulse 94   Temp 98.8 F (37.1 C) (Oral)   Resp 18   SpO2 96%  Physical Exam  ED Results / Procedures / Treatments   Labs (all labs ordered are listed, but only abnormal results are displayed) Labs Reviewed  COMPREHENSIVE METABOLIC PANEL - Abnormal; Notable for the following components:      Result Value    Potassium 3.3 (*)    Total Protein 8.2 (*)    All other components within normal limits  URINALYSIS, ROUTINE W REFLEX MICROSCOPIC - Abnormal; Notable for the following components:   Color, Urine AMBER (*)    APPearance CLOUDY (*)    Specific Gravity, Urine 1.031 (*)    Protein, ur 30 (*)    Bacteria, UA RARE (*)    All other components within normal limits  LIPASE, BLOOD  CBC  I-STAT BETA HCG BLOOD, ED ([facility], [facility], [facility] ONLY)    EKG None  Radiology No results found.  Procedures Procedures    Medications Ordered in ED Medications  ondansetron (ZOFRAN-ODT) disintegrating tablet 8 mg (8 mg Oral Given 07/19/22 0031)  sodium chloride 0.9 % bolus 1,000 mL (1,000 mLs Intravenous New Bag/Given 07/19/22 0951)  ondansetron (ZOFRAN) injection 4 mg (4 mg Intravenous Given 07/19/22 0946)    ED Course/ Medical Decision Making/ A&P                           Medical Decision Making Amount and/or Complexity of Data Reviewed Labs: ordered.  Risk Prescription drug management.   Jessica Greene is here with nausea and vomiting.  Normal vitals.  No fever.  Differential diagnosis is medication side effect, hyperemesis from marijuana, viral process.  I have no concern for cholecystitis  or pancreatitis or other acute intra-abdominal process at this time.  Her abdominal exam is benign.  She is overall well-appearing.  She had CBC, CMP, lipase, urinalysis, pregnancy test done prior to my evaluation.  Per my review and interpretation of the labs is no urinary tract infection.  Pregnancy test is negative.  There is no significant anemia, electrolyte abnormality, kidney injury.  Gallbladder and liver enzymes within normal limits.  Lipase within normal limits.  Overall have no concern for cholecystitis or pancreatitis.  We will give further symptom management with IV fluids and IV Zofran and reevaluate.  Anticipate discharge.  Patient feeling better.  Discharged with prescription for Zofran.  This  chart was dictated using voice recognition software.  Despite best efforts to proofread,  errors can occur which can change the documentation meaning.         Final Clinical Impression(s) / ED Diagnoses Final diagnoses:  Nausea and vomiting, unspecified vomiting type    Rx / DC Orders ED Discharge Orders          Ordered    ondansetron (ZOFRAN) 4 MG tablet  Every 6 hours        07/19/22 1009              Jontay Maston, DO 07/19/22 1016

## 2022-07-22 ENCOUNTER — Encounter: Payer: Self-pay | Admitting: Nurse Practitioner

## 2022-07-22 ENCOUNTER — Other Ambulatory Visit (HOSPITAL_COMMUNITY)
Admission: RE | Admit: 2022-07-22 | Discharge: 2022-07-22 | Disposition: A | Discharge status: Home / Self Care | Payer: BC Managed Care – PPO | Source: Ambulatory Visit | Attending: Nurse Practitioner | Admitting: Nurse Practitioner

## 2022-07-22 ENCOUNTER — Ambulatory Visit (INDEPENDENT_AMBULATORY_CARE_PROVIDER_SITE_OTHER): Payer: BC Managed Care – PPO | Admitting: Nurse Practitioner

## 2022-07-22 VITALS — BP 130/86 | HR 85 | Temp 97.0°F | Ht 68.0 in | Wt 242.0 lb

## 2022-07-22 DIAGNOSIS — N898 Other specified noninflammatory disorders of vagina: Secondary | ICD-10-CM

## 2022-07-22 DIAGNOSIS — K219 Gastro-esophageal reflux disease without esophagitis: Secondary | ICD-10-CM

## 2022-07-22 DIAGNOSIS — Z6841 Body Mass Index (BMI) 40.0 and over, adult: Secondary | ICD-10-CM | POA: Diagnosis not present

## 2022-07-22 MED ORDER — OMEPRAZOLE 20 MG PO CPDR: 20.0000 mg | DELAYED_RELEASE_CAPSULE | Freq: Every morning | ORAL | 0 refills | Status: DC

## 2022-07-22 MED ORDER — WEGOVY 0.25 MG/0.5ML ~~LOC~~ SOAJ
0.2500 mg | SUBCUTANEOUS | 0 refills | Status: DC
Start: 2022-07-22 — End: 2022-09-10

## 2022-07-22 MED ORDER — ONDANSETRON HCL 4 MG PO TABS: 4.0000 mg | ORAL_TABLET | Freq: Three times a day (TID) | ORAL | 0 refills | Status: DC | PRN

## 2022-07-22 NOTE — Assessment & Plan Note (Addendum)
Reports severe nausea and vomiting with first dose of wegovy 5days ago. Symptoms Now resolved with zofran prn. Labs completed by ED: CBC, CMP, lipase, UA, urine preg: negative except low potassium. IV fluids and zofran administered. She admits to high fat diet She has also noticed increased GERD symptoms. Wt Readings from Last 3 Encounters:  07/22/22 242 lb (109.8 kg)  06/17/22 249 lb 9.6 oz (113.2 kg)  04/15/22 264 lb (119.7 kg)   Start omeprazole 20mg  daily, use zofran for nuasea prn, advised to modify diet to low fat/low carb and small meal portions, decreased wegovy dose to 0.25mg  weekly F/up in 36month

## 2022-07-22 NOTE — Patient Instructions (Signed)
Maintain low fat/low carb diet. Eat small frequent meals. Start omeprazole 20mg  daily Use zofran as needed for nausea Decrease wegovy dose to 0.25mg   DASH Eating Plan DASH stands for Dietary Approaches to Stop Hypertension. The DASH eating plan is a healthy eating plan that has been shown to: Reduce high blood pressure (hypertension). Reduce your risk for type 2 diabetes, heart disease, and stroke. Help with weight loss. What are tips for following this plan? Reading food labels Check food labels for the amount of salt (sodium) per serving. Choose foods with less than 5 percent of the Daily Value of sodium. Generally, foods with less than 300 milligrams (mg) of sodium per serving fit into this eating plan. To find whole grains, look for the word "whole" as the first word in the ingredient list. Shopping Buy products labeled as "low-sodium" or "no salt added." Buy fresh foods. Avoid canned foods and pre-made or frozen meals. Cooking Avoid adding salt when cooking. Use salt-free seasonings or herbs instead of table salt or sea salt. Check with your health care provider or pharmacist before using salt substitutes. Do not fry foods. Cook foods using healthy methods such as baking, boiling, grilling, roasting, and broiling instead. Cook with heart-healthy oils, such as olive, canola, avocado, soybean, or sunflower oil. Meal planning  Eat a balanced diet that includes: 4 or more servings of fruits and 4 or more servings of vegetables each day. Try to fill one-half of your plate with fruits and vegetables. 6-8 servings of whole grains each day. Less than 6 oz (170 g) of lean meat, poultry, or fish each day. A 3-oz (85-g) serving of meat is about the same size as a deck of cards. One egg equals 1 oz (28 g). 2-3 servings of low-fat dairy each day. One serving is 1 cup (237 mL). 1 serving of nuts, seeds, or beans 5 times each week. 2-3 servings of heart-healthy fats. Healthy fats called omega-3  fatty acids are found in foods such as walnuts, flaxseeds, fortified milks, and eggs. These fats are also found in cold-water fish, such as sardines, salmon, and mackerel. Limit how much you eat of: Canned or prepackaged foods. Food that is high in trans fat, such as some fried foods. Food that is high in saturated fat, such as fatty meat. Desserts and other sweets, sugary drinks, and other foods with added sugar. Full-fat dairy products. Do not salt foods before eating. Do not eat more than 4 egg yolks a week. Try to eat at least 2 vegetarian meals a week. Eat more home-cooked food and less restaurant, buffet, and fast food. Lifestyle When eating at a restaurant, ask that your food be prepared with less salt or no salt, if possible. If you drink alcohol: Limit how much you use to: 0-1 drink a day for women who are not pregnant. 0-2 drinks a day for men. Be aware of how much alcohol is in your drink. In the [place] one drink equals one 12 oz bottle of beer (355 mL), one 5 oz glass of wine (148 mL), or one 1 oz glass of hard liquor (44 mL). General information Avoid eating more than 2,300 mg of salt a day. If you have hypertension, you may need to reduce your sodium intake to 1,500 mg a day. Work with your health care provider to maintain a healthy body weight or to lose weight. Ask what an ideal weight is for you. Get at least 30 minutes of exercise that causes your heart to  beat faster (aerobic exercise) most days of the week. Activities may include walking, swimming, or biking. Work with your health care provider or dietitian to adjust your eating plan to your individual calorie needs. What foods should I eat? Fruits All fresh, dried, or frozen fruit. Canned fruit in natural juice (without added sugar). Vegetables Fresh or frozen vegetables (raw, steamed, roasted, or grilled). Low-sodium or reduced-sodium tomato and vegetable juice. Low-sodium or reduced-sodium tomato sauce and tomato  paste. Low-sodium or reduced-sodium canned vegetables. Grains Whole-grain or whole-wheat bread. Whole-grain or whole-wheat pasta. Brown rice. Orpah Cobb. Bulgur. Whole-grain and low-sodium cereals. Pita bread. Low-fat, low-sodium crackers. Whole-wheat flour tortillas. Meats and other proteins Skinless chicken or [place]. Ground chicken or [place]. Pork with fat trimmed off. Fish and seafood. Egg whites. Dried beans, peas, or lentils. Unsalted nuts, nut butters, and seeds. Unsalted canned beans. Lean cuts of beef with fat trimmed off. Low-sodium, lean precooked or cured meat, such as sausages or meat loaves. Dairy Low-fat (1%) or fat-free (skim) milk. Reduced-fat, low-fat, or fat-free cheeses. Nonfat, low-sodium ricotta or cottage cheese. Low-fat or nonfat yogurt. Low-fat, low-sodium cheese. Fats and oils Soft margarine without trans fats. Vegetable oil. Reduced-fat, low-fat, or light mayonnaise and salad dressings (reduced-sodium). Canola, safflower, olive, avocado, soybean, and sunflower oils. Avocado. Seasonings and condiments Herbs. Spices. Seasoning mixes without salt. Other foods Unsalted popcorn and pretzels. Fat-free sweets. The items listed above may not be a complete list of foods and beverages you can eat. Contact a dietitian for more information. What foods should I avoid? Fruits Canned fruit in a light or heavy syrup. Fried fruit. Fruit in cream or butter sauce. Vegetables Creamed or fried vegetables. Vegetables in a cheese sauce. Regular canned vegetables (not low-sodium or reduced-sodium). Regular canned tomato sauce and paste (not low-sodium or reduced-sodium). Regular tomato and vegetable juice (not low-sodium or reduced-sodium). Rosita Fire. Olives. Grains Baked goods made with fat, such as croissants, muffins, or some breads. Dry pasta or rice meal packs. Meats and other proteins Fatty cuts of meat. Ribs. Fried meat. Tomasa Blase. Bologna, salami, and other precooked or cured meats,  such as sausages or meat loaves. Fat from the back of a pig (fatback). Bratwurst. Salted nuts and seeds. Canned beans with added salt. Canned or smoked fish. Whole eggs or egg yolks. Chicken or [place] with skin. Dairy Whole or 2% milk, cream, and half-and-half. Whole or full-fat cream cheese. Whole-fat or sweetened yogurt. Full-fat cheese. Nondairy creamers. Whipped toppings. Processed cheese and cheese spreads. Fats and oils Butter. Stick margarine. Lard. Shortening. Ghee. Bacon fat. Tropical oils, such as coconut, palm kernel, or palm oil. Seasonings and condiments Onion salt, garlic salt, seasoned salt, table salt, and sea salt. Worcestershire sauce. Tartar sauce. Barbecue sauce. Teriyaki sauce. Soy sauce, including reduced-sodium. Steak sauce. Canned and packaged gravies. Fish sauce. Oyster sauce. Cocktail sauce. Store-bought horseradish. Ketchup. Mustard. Meat flavorings and tenderizers. Bouillon cubes. Hot sauces. Pre-made or packaged marinades. Pre-made or packaged taco seasonings. Relishes. Regular salad dressings. Other foods Salted popcorn and pretzels. The items listed above may not be a complete list of foods and beverages you should avoid. Contact a dietitian for more information. Where to find more information National Heart, Lung, and Blood Institute: PopSteam.is American Heart Association: www.heart.org Academy of Nutrition and Dietetics: www.eatright.org National Kidney Foundation: www.kidney.org Summary The DASH eating plan is a healthy eating plan that has been shown to reduce high blood pressure (hypertension). It may also reduce your risk for type 2 diabetes, heart disease, and stroke. When  on the DASH eating plan, aim to eat more fresh fruits and vegetables, whole grains, lean proteins, low-fat dairy, and heart-healthy fats. With the DASH eating plan, you should limit salt (sodium) intake to 2,300 mg a day. If you have hypertension, you may need to reduce your sodium  intake to 1,500 mg a day. Work with your health care provider or dietitian to adjust your eating plan to your individual calorie needs. This information is not intended to replace advice given to you by your health care provider. Make sure you discuss any questions you have with your health care provider. Document Revised: 10/13/2019 Document Reviewed: 10/13/2019 Elsevier Patient Education  2023 [employer].

## 2022-07-22 NOTE — Progress Notes (Signed)
Established Patient Visit  Patient: Jessica Greene   DOB: 27-Nov-1985   36 y.o. Female  MRN: [id number] Visit Date: 07/22/2022  Subjective:    Chief Complaint  Patient presents with   Office Visit    ER F/u from 07/18/2022 Says she had a had a bad reaction to wegovy, nausea and vomiting Vaginal discharge      Vaginal Discharge The patient's primary symptoms include vaginal discharge. The patient's pertinent negatives include no genital itching, genital lesions, genital odor, genital rash, missed menses, pelvic pain or vaginal bleeding. This is a new problem. The current episode started in the past 7 days. The problem occurs constantly. The problem has been unchanged. The patient is experiencing no pain. She is not pregnant. Pertinent negatives include no abdominal pain, anorexia, back pain, chills, constipation, diarrhea, discolored urine, dysuria, fever, flank pain, frequency, headaches, hematuria, joint pain, joint swelling, painful intercourse, rash, sore throat, urgency or vomiting. The vaginal discharge was normal. The vaginal bleeding is typical of menses. She has not been passing clots. She has not been passing tissue. Nothing aggravates the symptoms. She has tried nothing for the symptoms. She is sexually active. It is unknown whether or not her partner has an STD. She uses nothing for contraception. Her menstrual history has been regular. There is no history of PID, an STD or vaginosis.   Class 3 severe obesity due to excess calories with serious comorbidity and body mass index (BMI) of 40.0 to 44.9 in adult [facility] Reports severe nausea and vomiting with first dose of wegovy 5days ago. Symptoms Now resolved with zofran prn. Labs completed by ED: CBC, CMP, lipase, UA, urine preg: negative except low potassium. IV fluids and zofran administered. She admits to high fat diet She has also noticed increased GERD symptoms. Wt Readings from Last 3 Encounters:  07/22/22 242  lb (109.8 kg)  06/17/22 249 lb 9.6 oz (113.2 kg)  04/15/22 264 lb (119.7 kg)   Start omeprazole 20mg  daily, use zofran for nuasea prn, advised to modify diet to low fat/low carb and small meal portions, decreased wegovy dose to 0.25mg  weekly F/up in 92month   Reviewed medical, surgical, and social history today  Medications: Outpatient Medications Prior to Visit  Medication Sig   fenofibrate (TRICOR) 145 MG tablet Take 1 tablet (145 mg total) by mouth daily.   hydrochlorothiazide (MICROZIDE) 12.5 MG capsule Take 1 capsule (12.5 mg total) by mouth daily.   potassium chloride SA (KLOR-CON M) 20 MEQ tablet Take 1 tablet (20 mEq total) by mouth 2 (two) times daily. 2month BID x2days, then resume BID.   [DISCONTINUED] ondansetron (ZOFRAN) 4 MG tablet Take 1 tablet (4 mg total) by mouth every 6 (six) hours.   [DISCONTINUED] Semaglutide-Weight Management (WEGOVY) 1.7 MG/0.75ML SOAJ Inject 1.7 mg into the skin once a week.   No facility-administered medications prior to visit.   Reviewed past medical and social history.   ROS per HPI above  Last CBC Lab Results  Component Value Date   WBC 7.3 07/19/2022   HGB 13.7 07/19/2022   HCT 41.0 07/19/2022   MCV 94.0 07/19/2022   MCH 31.4 07/19/2022   RDW 13.6 07/19/2022   PLT 302 07/19/2022   Last metabolic panel Lab Results  Component Value Date   GLUCOSE 91 07/19/2022   NA 137 07/19/2022   K 3.3 (L) 07/19/2022   CL 104 07/19/2022   CO2 23 07/19/2022  BUN 10 07/19/2022   CREATININE 0.86 07/19/2022   GFRNONAA >60 07/19/2022   CALCIUM 9.5 07/19/2022   PROT 8.2 (H) 07/19/2022   ALBUMIN 4.4 07/19/2022   BILITOT 0.7 07/19/2022   ALKPHOS 59 07/19/2022   AST 21 07/19/2022   ALT 22 07/19/2022   ANIONGAP 10 07/19/2022      Objective:  BP 130/86 (BP Location: Right Arm, Patient Position: Sitting, Cuff Size: Normal)   Pulse 85   Temp (!) 97 F (36.1 C) (Temporal)   Ht 5\' 8"  (1.727 m)   Wt 242 lb (109.8 kg)   SpO2 95%   BMI  36.80 kg/m      Physical Exam Constitutional:      Appearance: She is obese.  Cardiovascular:     Rate and Rhythm: Normal rate.     Pulses: Normal pulses.  Pulmonary:     Effort: Pulmonary effort is normal.  Abdominal:     General: Bowel sounds are normal.     Palpations: Abdomen is soft.  Neurological:     Mental Status: She is alert and oriented to person, place, and time.     No results found for any visits on 07/22/22.    Assessment & Plan:    Problem List Items Addressed This Visit       Digestive   GERD (gastroesophageal reflux disease)   Relevant Medications   ondansetron (ZOFRAN) 4 MG tablet   omeprazole (PRILOSEC) 20 MG capsule     Other   Class 3 severe obesity due to excess calories with serious comorbidity and body mass index (BMI) of 40.0 to 44.9 in adult [facility]) - Primary    Reports severe nausea and vomiting with first dose of wegovy 5days ago. Symptoms Now resolved with zofran prn. Labs completed by ED: CBC, CMP, lipase, UA, urine preg: negative except low potassium. IV fluids and zofran administered. She admits to high fat diet She has also noticed increased GERD symptoms. Wt Readings from Last 3 Encounters:  07/22/22 242 lb (109.8 kg)  06/17/22 249 lb 9.6 oz (113.2 kg)  04/15/22 264 lb (119.7 kg)   Start omeprazole 20mg  daily, use zofran for nuasea prn, advised to modify diet to low fat/low carb and small meal portions, decreased wegovy dose to 0.25mg  weekly F/up in 59month      Relevant Medications   Semaglutide-Weight Management (WEGOVY) 0.25 MG/0.5ML SOAJ   Other Visit Diagnoses     Vaginal discharge       Relevant Orders   Cervicovaginal ancillary only( [place])      Return in about 4 weeks (around 08/19/2022) for Weight management and GERD.     2month, NP

## 2022-07-24 LAB — CERVICOVAGINAL ANCILLARY ONLY
Bacterial Vaginitis (gardnerella): NEGATIVE
Candida Glabrata: NEGATIVE
Candida Vaginitis: NEGATIVE
Chlamydia: NEGATIVE
Comment: NEGATIVE
Comment: NEGATIVE
Comment: NEGATIVE
Comment: NEGATIVE
Comment: NEGATIVE
Comment: NORMAL
Neisseria Gonorrhea: NEGATIVE
Trichomonas: NEGATIVE

## 2022-08-14 ENCOUNTER — Ambulatory Visit: Payer: BC Managed Care – PPO | Admitting: Nurse Practitioner

## 2022-09-09 ENCOUNTER — Encounter (INDEPENDENT_AMBULATORY_CARE_PROVIDER_SITE_OTHER): Payer: BC Managed Care – PPO | Admitting: Nurse Practitioner

## 2022-09-09 DIAGNOSIS — Z6841 Body Mass Index (BMI) 40.0 and over, adult: Secondary | ICD-10-CM | POA: Diagnosis not present

## 2022-09-10 MED ORDER — WEGOVY 1.7 MG/0.75ML ~~LOC~~ SOAJ: 1.7000 mg | SUBCUTANEOUS | 0 refills | Status: DC

## 2022-09-10 NOTE — Addendum Note (Signed)
Addended by: Wilfred Lacy L on: 09/10/2022 12:50 PM   Modules accepted: Orders

## 2022-09-10 NOTE — Telephone Encounter (Signed)
Please see the MyChart message reply(ies) for my assessment and plan.  The patient gave consent for this Medical Advice Message and is aware that it may result in a bill to their insurance company as well as the possibility that this may result in a co-payment or deductible. They are an established patient, but are not seeking medical advice exclusively about a problem treated during an in person or video visit in the last 7 days. I did not recommend an in person or video visit within 7 days of my reply.  I spent a total of 12 minutes cumulative time within 7 days through [place], NP

## 2022-09-11 ENCOUNTER — Ambulatory Visit: Payer: BC Managed Care – PPO | Admitting: Nurse Practitioner

## 2022-09-14 ENCOUNTER — Telehealth: Payer: Self-pay | Admitting: Nurse Practitioner

## 2022-09-14 DIAGNOSIS — E781 Pure hyperglyceridemia: Secondary | ICD-10-CM

## 2022-09-14 DIAGNOSIS — I10 Essential (primary) hypertension: Secondary | ICD-10-CM

## 2022-09-14 DIAGNOSIS — K219 Gastro-esophageal reflux disease without esophagitis: Secondary | ICD-10-CM

## 2022-09-14 MED ORDER — HYDROCHLOROTHIAZIDE 12.5 MG PO CAPS: 12.5000 mg | ORAL_CAPSULE | Freq: Every day | ORAL | 1 refills | Status: DC

## 2022-09-14 MED ORDER — FENOFIBRATE 145 MG PO TABS: 145.0000 mg | ORAL_TABLET | Freq: Every day | ORAL | 1 refills | Status: DC

## 2022-09-14 MED ORDER — OMEPRAZOLE 20 MG PO CPDR: 20.0000 mg | DELAYED_RELEASE_CAPSULE | Freq: Every morning | ORAL | 0 refills | Status: DC

## 2022-09-14 NOTE — Telephone Encounter (Signed)
Jessica Greene w/[facility] Ph [phone]   MEDICATION(S):  fenofibrate (TRICOR) 145 MG tablet hydrochlorothiazide (MICROZIDE) 12.5 MG capsule omeprazole (PRILOSEC) 20 MG capsule  Has the patient contacted their pharmacy (YES/NO)? Yes, requesting 90 day supply with up to 3 refills  Preferred Pharmacy:  [place], [place] Phone:  [phone]  Fax:  [phone]

## 2022-10-01 ENCOUNTER — Ambulatory Visit: Payer: BC Managed Care – PPO | Admitting: Nurse Practitioner

## 2022-10-23 DIAGNOSIS — Z419 Encounter for procedure for purposes other than remedying health state, unspecified: Secondary | ICD-10-CM | POA: Diagnosis not present

## 2022-11-05 ENCOUNTER — Telehealth: Payer: BC Managed Care – PPO | Admitting: Emergency Medicine

## 2022-11-05 DIAGNOSIS — J111 Influenza due to unidentified influenza virus with other respiratory manifestations: Secondary | ICD-10-CM

## 2022-11-05 MED ORDER — OSELTAMIVIR PHOSPHATE 75 MG PO CAPS: 75.0000 mg | ORAL_CAPSULE | Freq: Two times a day (BID) | ORAL | 0 refills | Status: AC

## 2022-11-05 NOTE — Progress Notes (Signed)
E visit for Flu like symptoms   We are sorry that you are not feeling well.  Here is how we plan to help! Based on what you have shared with me it looks like you may have a respiratory virus that may be influenza.  Influenza or "the flu" is   an infection caused by a respiratory virus. The flu virus is highly contagious and persons who did not receive their yearly flu vaccination may "catch" the flu from close contact.  We have anti-viral medications to treat the viruses that cause this infection. They are not a "cure" and only shorten the course of the infection. These prescriptions are most effective when they are given within the first 2 days of "flu" symptoms. Antiviral medication are indicated if you have a high risk of complications from the flu. You should  also consider an antiviral medication if you are in close contact with someone who is at risk. These medications can help patients avoid complications from the flu  but have side effects that you should know. Possible side effects from Tamiflu or oseltamivir include nausea, vomiting, diarrhea, dizziness, headaches, eye redness, sleep problems or other respiratory symptoms. You should not take Tamiflu if you have an allergy to oseltamivir or any to the ingredients in Tamiflu.  Based upon your symptoms and potential risk factors I have prescribed Oseltamivir (Tamiflu).  It has been sent to your designated pharmacy.  You will take one 75 mg capsule orally twice a day for the next 5 days.  ANYONE WHO HAS FLU SYMPTOMS SHOULD: Stay home. The flu is highly contagious and going out or to work exposes others! Be sure to drink plenty of fluids. Water is fine as well as fruit juices, sodas and electrolyte beverages. You may want to stay away from caffeine or alcohol. If you are nauseated, try taking small sips of liquids. How do you know if you are getting enough fluid? Your urine should be a pale yellow or almost colorless. Get rest. Taking a steamy  shower or using a humidifier may help nasal congestion and ease sore throat pain. Using a saline nasal spray works much the same way. Cough drops, hard candies and sore throat lozenges may ease your cough. Line up a caregiver. Have someone check on you regularly.   GET HELP RIGHT AWAY IF: You cannot keep down liquids or your medications. You become short of breath Your fell like you are going to pass out or loose consciousness. Your symptoms persist after you have completed your treatment plan MAKE SURE YOU  Understand these instructions. Will watch your condition. Will get help right away if you are not doing well or get worse.  Your e-visit answers were reviewed by a board certified advanced clinical practitioner to complete your personal care plan.  Depending on the condition, your plan could have included both over the counter or prescription medications.  If there is a problem please reply  once you have received a response from your provider.  Your safety is important to us.  If you have drug allergies check your prescription carefully.    You can use MyChart to ask questions about today's visit, request a non-urgent call back, or ask for a work or school excuse for 24 hours related to this e-Visit. If it has been greater than 24 hours you will need to follow up with your provider, or enter a new e-Visit to address those concerns.  You will get an e-mail in the next   two days asking about your experience.  I hope that your e-visit has been valuable and will speed your recovery. Thank you for using e-visits.  I have spent 5 minutes in review of e-visit questionnaire, review and updating patient chart, medical decision making and response to patient.   Twanda Stakes, PhD, FNP-BC   

## 2022-11-09 ENCOUNTER — Telehealth: Payer: BC Managed Care – PPO | Admitting: Physician Assistant

## 2022-11-09 DIAGNOSIS — H109 Unspecified conjunctivitis: Secondary | ICD-10-CM

## 2022-11-09 MED ORDER — POLYMYXIN B-TRIMETHOPRIM 10000-0.1 UNIT/ML-% OP SOLN: 1.0000 [drp] | OPHTHALMIC | 0 refills | Status: DC

## 2022-11-09 NOTE — Progress Notes (Signed)

## 2022-11-12 ENCOUNTER — Ambulatory Visit: Payer: BC Managed Care – PPO | Admitting: Nurse Practitioner

## 2022-11-23 DIAGNOSIS — Z419 Encounter for procedure for purposes other than remedying health state, unspecified: Secondary | ICD-10-CM | POA: Diagnosis not present

## 2022-12-08 ENCOUNTER — Encounter: Payer: Self-pay | Admitting: Nurse Practitioner

## 2022-12-15 ENCOUNTER — Other Ambulatory Visit (HOSPITAL_COMMUNITY)
Admission: RE | Admit: 2022-12-15 | Discharge: 2022-12-15 | Disposition: A | Discharge status: Home / Self Care | Payer: BC Managed Care – PPO | Source: Ambulatory Visit | Attending: Nurse Practitioner | Admitting: Nurse Practitioner

## 2022-12-15 ENCOUNTER — Encounter: Payer: Self-pay | Admitting: Nurse Practitioner

## 2022-12-15 ENCOUNTER — Ambulatory Visit (INDEPENDENT_AMBULATORY_CARE_PROVIDER_SITE_OTHER): Payer: BC Managed Care – PPO | Admitting: Nurse Practitioner

## 2022-12-15 VITALS — BP 122/90 | HR 80 | Temp 98.3°F | Ht 68.0 in | Wt 253.2 lb

## 2022-12-15 DIAGNOSIS — E66812 Obesity, class 2: Secondary | ICD-10-CM

## 2022-12-15 DIAGNOSIS — E782 Mixed hyperlipidemia: Secondary | ICD-10-CM | POA: Diagnosis not present

## 2022-12-15 DIAGNOSIS — Z113 Encounter for screening for infections with a predominantly sexual mode of transmission: Secondary | ICD-10-CM | POA: Insufficient documentation

## 2022-12-15 DIAGNOSIS — Z6838 Body mass index (BMI) 38.0-38.9, adult: Secondary | ICD-10-CM | POA: Diagnosis not present

## 2022-12-15 DIAGNOSIS — F3342 Major depressive disorder, recurrent, in full remission: Secondary | ICD-10-CM

## 2022-12-15 DIAGNOSIS — R7303 Prediabetes: Secondary | ICD-10-CM

## 2022-12-15 DIAGNOSIS — F332 Major depressive disorder, recurrent severe without psychotic features: Secondary | ICD-10-CM | POA: Insufficient documentation

## 2022-12-15 DIAGNOSIS — D500 Iron deficiency anemia secondary to blood loss (chronic): Secondary | ICD-10-CM | POA: Diagnosis not present

## 2022-12-15 DIAGNOSIS — R2232 Localized swelling, mass and lump, left upper limb: Secondary | ICD-10-CM

## 2022-12-15 DIAGNOSIS — I10 Essential (primary) hypertension: Secondary | ICD-10-CM

## 2022-12-15 LAB — BASIC METABOLIC PANEL
BUN: 9 mg/dL (ref 6–23)
CO2: 27 mEq/L (ref 19–32)
Calcium: 9.3 mg/dL (ref 8.4–10.5)
Chloride: 100 mEq/L (ref 96–112)
Creatinine, Ser: 0.58 mg/dL (ref 0.40–1.20)
GFR: 115.5 mL/min (ref >60.00)
Glucose, Bld: 88 mg/dL (ref 70–99)
Potassium: 3.6 mEq/L (ref 3.5–5.1)
Sodium: 138 mEq/L (ref 135–145)

## 2022-12-15 LAB — LIPID PANEL
Cholesterol: 200 mg/dL (ref 0–200)
HDL: 37.9 mg/dL — ABNORMAL LOW (ref >39.00)
LDL Cholesterol: 138 mg/dL — ABNORMAL HIGH (ref 0–99)
NonHDL: 161.83
Total CHOL/HDL Ratio: 5
Triglycerides: 121 mg/dL (ref 0.0–149.0)
VLDL: 24.2 mg/dL (ref 0.0–40.0)

## 2022-12-15 LAB — IBC + FERRITIN
Ferritin: 61 ng/mL (ref 10.0–291.0)
Iron: 72 ug/dL (ref 42–145)
Saturation Ratios: 16 % — ABNORMAL LOW (ref 20.0–50.0)
TIBC: 449.4 ug/dL (ref 250.0–450.0)
Transferrin: 321 mg/dL (ref 212.0–360.0)

## 2022-12-15 LAB — HEMOGLOBIN A1C: Hgb A1c MFr Bld: 6.1 % (ref 4.6–6.5)

## 2022-12-15 LAB — LIPASE: Lipase: 38 U/L (ref 11.0–59.0)

## 2022-12-15 MED ORDER — CEPHALEXIN 500 MG PO CAPS: 500.0000 mg | ORAL_CAPSULE | Freq: Three times a day (TID) | ORAL | 0 refills | Status: DC

## 2022-12-15 MED ORDER — CEPHALEXIN 500 MG PO CAPS: 500.0000 mg | ORAL_CAPSULE | Freq: Two times a day (BID) | ORAL | 0 refills | Status: DC

## 2022-12-15 MED ORDER — WEGOVY 0.5 MG/0.5ML ~~LOC~~ SOAJ: 0.5000 mg | SUBCUTANEOUS | 0 refills | Status: DC

## 2022-12-15 NOTE — Patient Instructions (Addendum)
Go to lab Decrease wegovy to 0.5mg . new rx sent Start keflex 500mg  BID x 7days Alsp apply warm compress on upper leg 2-3x/day. Call office if no improvement in 1week. Will need CT thigh

## 2022-12-15 NOTE — Progress Notes (Signed)
Established Patient Visit  Patient: Jessica Greene   DOB: [birth year]   37 y.o. Female  MRN: [id number] Visit Date: 12/15/2022  Subjective:    Chief Complaint  Patient presents with   Office Visit    Weight management  Pain in left thigh x 2 days , says she felt a knot  Would like a self swab, no vaginal discharge, just wants to be tested    HPI Obesity No weight loss noted Unable to tolerate wegovy 1.7mg : nausea and GERD, so she stopped med 35month ago. Wt Readings from Last 3 Encounters:  12/15/22 253 lb 3.2 oz (114.9 kg)  07/22/22 242 lb (109.8 kg)  06/17/22 249 lb 9.6 oz (113.2 kg)    Decrease wegovy dose to 0.5mg  weekly. Advised to maintain heart healthy diet and daily exercise.  Prediabetes Repeat hgbA1c  Subcutaneous mass of left upper extremity Onset 1week ago, associated with anterior thigh pain, no weakness, no fever, no pelvic pain, no swollen lymph nodes  Tender nodules in proximal quadricep muscle. No erythema, no indentation, no fluctuance. No inguinal lyphmphadenopathy. Possible abscess vs lipoma? Sent keflex, advised to apply warm compress. Order CT if no improvement in 1week  Mixed hyperlipidemia Repeat lipid panel  Reviewed medical, surgical, and social history today  Medications: Outpatient Medications Prior to Visit  Medication Sig   fenofibrate (TRICOR) 145 MG tablet Take 1 tablet (145 mg total) by mouth daily.   hydrochlorothiazide (MICROZIDE) 12.5 MG capsule Take 1 capsule (12.5 mg total) by mouth daily.   omeprazole (PRILOSEC) 20 MG capsule Take 1 capsule (20 mg total) by mouth in the morning.   ondansetron (ZOFRAN) 4 MG tablet Take 1 tablet (4 mg total) by mouth every 8 (eight) hours as needed for nausea or vomiting.   potassium chloride SA (KLOR-CON M) 20 MEQ tablet Take 1 tablet (20 mEq total) by mouth 2 (two) times daily. 50meq BID x2days, then resume 69meq BID. (Patient not taking: Reported on 12/15/2022)    [DISCONTINUED] Semaglutide-Weight Management (WEGOVY) 1.7 MG/0.75ML SOAJ Inject 1.7 mg into the skin once a week. (Patient not taking: Reported on 12/15/2022)   [DISCONTINUED] trimethoprim-polymyxin b (POLYTRIM) ophthalmic solution Place 1 drop into the right eye every 4 (four) hours. X 5 days (Patient not taking: Reported on 12/15/2022)   No facility-administered medications prior to visit.   Reviewed past medical and social history.   ROS per HPI above      Objective:  BP (!) 122/90 (BP Location: Right Arm, Patient Position: Sitting, Cuff Size: Normal)   Pulse 80   Temp 98.3 F (36.8 C) (Temporal)   Ht 5\' 8"  (1.727 m)   Wt 253 lb 3.2 oz (114.9 kg)   SpO2 99%   BMI 38.50 kg/m      Physical Exam Vitals reviewed.  Constitutional:      Appearance: She is obese.  Cardiovascular:     Rate and Rhythm: Normal rate and regular rhythm.     Pulses: Normal pulses.     Heart sounds: Normal heart sounds.  Pulmonary:     Effort: Pulmonary effort is normal.     Breath sounds: Normal breath sounds.  Musculoskeletal:     Right lower leg: No edema.     Left lower leg: No edema.  Skin:    Findings: Lesion present. No erythema or rash.       Neurological:     Mental Status: She is  alert and oriented to person, place, and time.  Psychiatric:        Mood and Affect: Mood normal.        Behavior: Behavior normal.        Thought Content: Thought content normal.     No results found for any visits on 12/15/22.    Assessment & Plan:    Problem List Items Addressed This Visit       Cardiovascular and Mediastinum   HTN (hypertension)     Other   Mixed hyperlipidemia    Repeat lipid panel      Relevant Orders   Lipid panel   Obesity    No weight loss noted Unable to tolerate wegovy 1.7mg : nausea and GERD, so she stopped med 33month ago. Wt Readings from Last 3 Encounters:  12/15/22 253 lb 3.2 oz (114.9 kg)  07/22/22 242 lb (109.8 kg)  06/17/22 249 lb 9.6 oz (113.2 kg)     Decrease wegovy dose to 0.5mg  weekly. Advised to maintain heart healthy diet and daily exercise.      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 No erythema, no indentation, no fluctuance. No inguinal lyphmphadenopathy. Possible abscess vs lipoma? Sent keflex, advised to apply warm compress. Order CT if no improvement in 1week      Relevant Medications   cephALEXin (KEFLEX) 500 MG capsule   Other Visit Diagnoses     Screen for STD (sexually transmitted disease)    -  Primary   Relevant Orders   Cervicovaginal ancillary only( [place])   HIV Antibody (routine testing w rflx)   RPR   Iron deficiency anemia due to chronic blood loss       Relevant Orders   IBC + Ferritin      Return in about 4 weeks (around 01/12/2023) for Weight management.     Wilfred Lacy, NP

## 2022-12-15 NOTE — Assessment & Plan Note (Addendum)
Onset 1week ago, associated with anterior thigh pain, no weakness, no fever, no pelvic pain, no swollen lymph nodes  Tender nodules in proximal quadricep muscle. No erythema, no indentation, no fluctuance. No inguinal lyphmphadenopathy. Possible abscess vs lipoma? Sent keflex, advised to apply warm compress. Order CT if no improvement in 1week

## 2022-12-15 NOTE — Assessment & Plan Note (Signed)
Repeat hgbA1c 

## 2022-12-15 NOTE — Assessment & Plan Note (Signed)
No weight loss noted Unable to tolerate wegovy 1.7mg : nausea and GERD, so she stopped med 57month ago. Wt Readings from Last 3 Encounters:  12/15/22 253 lb 3.2 oz (114.9 kg)  07/22/22 242 lb (109.8 kg)  06/17/22 249 lb 9.6 oz (113.2 kg)    Decrease wegovy dose to 0.5mg  weekly. Advised to maintain heart healthy diet and daily exercise.

## 2022-12-15 NOTE — Assessment & Plan Note (Signed)
Repeat lipid panel ?

## 2022-12-16 ENCOUNTER — Encounter: Payer: Self-pay | Admitting: Nurse Practitioner

## 2022-12-16 LAB — CERVICOVAGINAL ANCILLARY ONLY
Bacterial Vaginitis (gardnerella): NEGATIVE
Candida Glabrata: NEGATIVE
Candida Vaginitis: NEGATIVE
Chlamydia: NEGATIVE
Comment: NEGATIVE
Comment: NEGATIVE
Comment: NEGATIVE
Comment: NEGATIVE
Comment: NEGATIVE
Comment: NORMAL
Neisseria Gonorrhea: NEGATIVE
Trichomonas: NEGATIVE

## 2022-12-16 LAB — HIV ANTIBODY (ROUTINE TESTING W REFLEX): HIV 1&2 Ab, 4th Generation: NONREACTIVE

## 2022-12-16 LAB — RPR: RPR Ser Ql: NONREACTIVE

## 2022-12-16 MED ORDER — ZEPBOUND 2.5 MG/0.5ML ~~LOC~~ SOAJ: 2.5000 mg | SUBCUTANEOUS | 0 refills | Status: DC

## 2022-12-17 ENCOUNTER — Other Ambulatory Visit: Payer: Self-pay | Admitting: Nurse Practitioner

## 2022-12-17 DIAGNOSIS — E876 Hypokalemia: Secondary | ICD-10-CM

## 2022-12-17 DIAGNOSIS — E782 Mixed hyperlipidemia: Secondary | ICD-10-CM

## 2022-12-17 MED ORDER — POTASSIUM CHLORIDE CRYS ER 20 MEQ PO TBCR: 20.0000 meq | EXTENDED_RELEASE_TABLET | Freq: Every day | ORAL | 1 refills | Status: DC

## 2022-12-17 MED ORDER — PRAVASTATIN SODIUM 20 MG PO TABS: 20.0000 mg | ORAL_TABLET | Freq: Every day | ORAL | 1 refills | Status: DC

## 2022-12-18 ENCOUNTER — Other Ambulatory Visit (HOSPITAL_COMMUNITY): Payer: Self-pay

## 2022-12-18 NOTE — Telephone Encounter (Signed)
Patient Advocate Encounter   Received notification from [facility] that prior authorization for Zepbound 2.5MG /0.5ML is required.   PA submitted on 12/18/2022 Key [id number] Status is pending

## 2022-12-23 NOTE — Telephone Encounter (Signed)
Fax from insurance confirming that PA has been received and is being reviewed

## 2022-12-24 DIAGNOSIS — Z419 Encounter for procedure for purposes other than remedying health state, unspecified: Secondary | ICD-10-CM | POA: Diagnosis not present

## 2022-12-25 NOTE — Telephone Encounter (Signed)
Pharmacy Patient Advocate Encounter  Prior Authorization for Zepbound has been approved.    PA# [id number] Effective dates: 10/22/2022 through 06/22/2023

## 2023-01-12 ENCOUNTER — Ambulatory Visit: Payer: BC Managed Care – PPO | Admitting: Nurse Practitioner

## 2023-01-15 ENCOUNTER — Encounter: Payer: Self-pay | Admitting: Nurse Practitioner

## 2023-01-15 MED ORDER — ZEPBOUND 2.5 MG/0.5ML ~~LOC~~ SOAJ: 2.5000 mg | SUBCUTANEOUS | 0 refills | Status: DC

## 2023-01-22 DIAGNOSIS — Z419 Encounter for procedure for purposes other than remedying health state, unspecified: Secondary | ICD-10-CM | POA: Diagnosis not present

## 2023-01-26 ENCOUNTER — Ambulatory Visit (INDEPENDENT_AMBULATORY_CARE_PROVIDER_SITE_OTHER): Payer: BC Managed Care – PPO | Admitting: Nurse Practitioner

## 2023-01-26 ENCOUNTER — Other Ambulatory Visit (HOSPITAL_COMMUNITY)
Admission: RE | Admit: 2023-01-26 | Discharge: 2023-01-26 | Disposition: A | Discharge status: Home / Self Care | Payer: BC Managed Care – PPO | Source: Ambulatory Visit | Attending: Nurse Practitioner | Admitting: Nurse Practitioner

## 2023-01-26 ENCOUNTER — Encounter: Payer: Self-pay | Admitting: Nurse Practitioner

## 2023-01-26 DIAGNOSIS — Z1159 Encounter for screening for other viral diseases: Secondary | ICD-10-CM

## 2023-01-26 DIAGNOSIS — N760 Acute vaginitis: Secondary | ICD-10-CM | POA: Diagnosis not present

## 2023-01-26 DIAGNOSIS — F411 Generalized anxiety disorder: Secondary | ICD-10-CM | POA: Diagnosis not present

## 2023-01-26 DIAGNOSIS — A599 Trichomoniasis, unspecified: Secondary | ICD-10-CM

## 2023-01-26 DIAGNOSIS — Z6838 Body mass index (BMI) 38.0-38.9, adult: Secondary | ICD-10-CM

## 2023-01-26 LAB — POCT URINE PREGNANCY: Preg Test, Ur: NEGATIVE

## 2023-01-26 MED ORDER — SERTRALINE HCL 25 MG PO TABS: 25.0000 mg | ORAL_TABLET | Freq: Every day | ORAL | 5 refills | Status: DC

## 2023-01-26 MED ORDER — ZEPBOUND 5 MG/0.5ML ~~LOC~~ SOAJ: 5.0000 mg | SUBCUTANEOUS | 1 refills | Status: DC

## 2023-01-26 NOTE — Progress Notes (Signed)
Established Patient Visit  Patient: Jessica Greene   DOB: 1986/05/04   37 y.o. Female  MRN: [id number] Visit Date: 01/26/2023  Subjective:    Chief Complaint  Patient presents with   Weight Loss    Weight loss management    Vaginal Discharge The patient's primary symptoms include a genital odor and vaginal discharge. The patient's pertinent negatives include no genital itching, genital lesions, genital rash, missed menses, pelvic pain or vaginal bleeding. This is a recurrent problem. The current episode started 1 to 4 weeks ago. The problem occurs intermittently. The problem has been waxing and waning. The patient is experiencing no pain. She is not pregnant. Pertinent negatives include no back pain, dysuria, fever, hematuria, painful intercourse, rash or urgency. The vaginal discharge was thick and white. The vaginal bleeding is typical of menses. She has not been passing clots. She has not been passing tissue. Nothing aggravates the symptoms. She has tried nothing for the symptoms. She is sexually active. It is unknown whether or not her partner has an STD. She uses nothing for contraception. Her menstrual history has been regular. Her past medical history is significant for vaginosis. There is no history of PID or an STD.  She is requesting repeat STD screen.  Obesity Exercise: walking 3-4x/week-2 miles each time. video exercise 2-3x/week-93mns each time Diet: eliminated soda and high fat meals. Eats 1-234mls daily-home prepared, and Meal replacement with protein shake 26g and 3g of sugar per serving. Reports mild constipation with zepbound 0.'25mg'$  Lost 12lbs in last 19m33month Readings from Last 3 Encounters:  01/26/23 241 lb 12.8 oz (109.7 kg)  12/15/22 253 lb 3.2 oz (114.9 kg)  07/22/22 242 lb (109.8 kg)    Advised to Increase water intake to 60-64oz daily Continue Heart healthy diet and daily exercise. Ok to use miralax or colace as needed for  constipation Increase zepbound to '5mg'$  weekly F/up in 1-28mo519monthAD (generalized anxiety disorder) Chronic, worse in last 19mon25monthociated with insomnia, intrusive thoughts, intermittent panic attacks (occurred at work, chest tightness, SOB, palpitations, felt overwhelmed), symptoms improved with deep breathing exercise. Also reports intrusive thoughts about death especially when driving or riding in car as passenger Denies any SI/HI or hallucination. No ETOH or tobacco or illicit drug use.  Agreed to start zoloft '25mg'$  and psychology referral, advised about possible side effects. F/up in 1-28mont45monthReadings from Last 3 Encounters:  01/26/23 112/71  12/15/22 (!) 122/90  07/22/22 130/86       01/26/2023    2:00 PM 01/28/2022   11:37 AM 07/15/2021    2:30 PM  Depression screen PHQ 2/9  Decreased Interest '1 1 2  '$ Down, Depressed, Hopeless 0 0 1  PHQ - 2 Score '1 1 3  '$ Altered sleeping '3 2 3  '$ Tired, decreased energy '2 3 3  '$ Change in appetite 0 2 3  Feeling bad or failure about yourself  0 0 0  Trouble concentrating 0 0 1  Moving slowly or fidgety/restless 0 0 0  Suicidal thoughts 0 0 0  PHQ-9 Score '6 8 13  '$ Difficult doing work/chores  Somewhat difficult        01/26/2023    2:02 PM 01/28/2022   11:38 AM 07/15/2021    2:30 PM 04/30/2021    1:49 PM  GAD 7 : Generalized Anxiety Score  Nervous, Anxious, on Edge 1 0 2 1  Control/stop worrying 1 0  3 1  Worry too much - different things 1 0 2 1  Trouble relaxing '1 1 2 1  '$ Restless 0 0 1 1  Easily annoyed or irritable '2 2 3 2  '$ Afraid - awful might happen '1 1 1 1  '$ Total GAD 7 Score '7 4 14 8  '$ Anxiety Difficulty Somewhat difficult Somewhat difficult  Somewhat difficult   Reviewed medical, surgical, and social history today  Medications: Outpatient Medications Prior to Visit  Medication Sig   hydrochlorothiazide (MICROZIDE) 12.5 MG capsule Take 1 capsule (12.5 mg total) by mouth daily.   omeprazole (PRILOSEC) 20 MG capsule Take 1  capsule (20 mg total) by mouth in the morning.   ondansetron (ZOFRAN) 4 MG tablet Take 1 tablet (4 mg total) by mouth every 8 (eight) hours as needed for nausea or vomiting.   potassium chloride SA (KLOR-CON M) 20 MEQ tablet Take 1 tablet (20 mEq total) by mouth daily.   pravastatin (PRAVACHOL) 20 MG tablet Take 1 tablet (20 mg total) by mouth at bedtime.   [DISCONTINUED] tirzepatide (ZEPBOUND) 2.5 MG/0.5ML Pen Inject 2.5 mg into the skin once a week. Maintain upcoming appt   [DISCONTINUED] cephALEXin (KEFLEX) 500 MG capsule Take 1 capsule (500 mg total) by mouth 2 (two) times daily. (Patient not taking: Reported on 01/26/2023)   No facility-administered medications prior to visit.   Reviewed past medical and social history.   ROS per HPI above      Objective:  BP 112/71 (BP Location: Left Arm, Patient Position: Sitting, Cuff Size: Large)   Pulse 75   Temp 98 F (36.7 C) (Temporal)   Resp 16   Ht '5\' 8"'$  (1.727 m)   Wt 241 lb 12.8 oz (109.7 kg)   LMP 01/14/2023 (Approximate)   SpO2 97%   BMI 36.77 kg/m      Physical Exam Vitals reviewed.  Cardiovascular:     Rate and Rhythm: Normal rate and regular rhythm.     Pulses: Normal pulses.     Heart sounds: Normal heart sounds.  Pulmonary:     Effort: Pulmonary effort is normal.     Breath sounds: Normal breath sounds.  Neurological:     Mental Status: She is alert and oriented to person, place, and time.  Psychiatric:        Mood and Affect: Mood normal.        Behavior: Behavior normal.        Thought Content: Thought content normal.     Results for orders placed or performed in visit on 01/26/23  POCT urine pregnancy  Result Value Ref Range   Preg Test, Ur Negative Negative      Assessment & Plan:    Problem List Items Addressed This Visit       Other   GAD (generalized anxiety disorder)    Chronic, worse in last 71month associated with insomnia, intrusive thoughts, intermittent panic attacks (occurred at work,  chest tightness, SOB, palpitations, felt overwhelmed), symptoms improved with deep breathing exercise. Also reports intrusive thoughts about death especially when driving or riding in car as passenger Denies any SI/HI or hallucination. No ETOH or tobacco or illicit drug use.  Agreed to start zoloft '25mg'$  and psychology referral, advised about possible side effects. F/up in 1-224month      Relevant Medications   sertraline (ZOLOFT) 25 MG tablet   Other Relevant Orders   Ambulatory referral to Psychology   Obesity - Primary    Exercise: walking 3-4x/week-2 miles  each time. video exercise 2-3x/week-56mns each time Diet: eliminated soda and high fat meals. Eats 1-244mls daily-home prepared, and Meal replacement with protein shake 26g and 3g of sugar per serving. Reports mild constipation with zepbound 0.'25mg'$  Lost 12lbs in last 41m84month Readings from Last 3 Encounters:  01/26/23 241 lb 12.8 oz (109.7 kg)  12/15/22 253 lb 3.2 oz (114.9 kg)  07/22/22 242 lb (109.8 kg)    Advised to Increase water intake to 60-64oz daily Continue Heart healthy diet and daily exercise. Ok to use miralax or colace as needed for constipation Increase zepbound to '5mg'$  weekly F/up in 1-73mo173month   Relevant Medications   tirzepatide (ZEPBOUND) 5 MG/0.5ML Pen   Other Visit Diagnoses     Acute vaginitis       Relevant Orders   POCT urine pregnancy (Completed)   Cervicovaginal ancillary only( [facility])   Encounter for screening for viral disease       Relevant Orders   [facility]aginal [facility]illary only( [id number]   HIV Antibody (routine testing w rflx)      Return in about 2 months (around 03/28/2023) for depression and anxiety, Weight management.     CharWilfred Lacy

## 2023-01-26 NOTE — Assessment & Plan Note (Signed)
Exercise: walking 3-4x/week-2 miles each time. video exercise 2-3x/week-673mns each time Diet: eliminated soda and high fat meals. Eats 1-219mls daily-home prepared, and Meal replacement with protein shake 26g and 3g of sugar per serving. Reports mild constipation with zepbound 0.'25mg'$  Lost 12lbs in last 73m55month Readings from Last 3 Encounters:  01/26/23 241 lb 12.8 oz (109.7 kg)  12/15/22 253 lb 3.2 oz (114.9 kg)  07/22/22 242 lb (109.8 kg)    Advised to Increase water intake to 60-64oz daily Continue Heart healthy diet and daily exercise. Ok to use miralax or colace as needed for constipation Increase zepbound to '5mg'$  weekly F/up in 1-39mo47month

## 2023-01-26 NOTE — Patient Instructions (Addendum)
Increase water intake to 60-64oz daily Continue Heart healthy diet and daily exercise. Start 1/2 tablet once daily for 1 week and then increase to a full tablet once daily continuously   Go to lab

## 2023-01-26 NOTE — Assessment & Plan Note (Signed)
Chronic, worse in last 16month associated with insomnia, intrusive thoughts, intermittent panic attacks (occurred at work, chest tightness, SOB, palpitations, felt overwhelmed), symptoms improved with deep breathing exercise. Also reports intrusive thoughts about death especially when driving or riding in car as passenger Denies any SI/HI or hallucination. No ETOH or tobacco or illicit drug use.  Agreed to start zoloft '25mg'$  and psychology referral, advised about possible side effects. F/up in 1-224month

## 2023-01-27 LAB — CERVICOVAGINAL ANCILLARY ONLY
Bacterial Vaginitis (gardnerella): POSITIVE — AB
Candida Glabrata: NEGATIVE
Candida Vaginitis: NEGATIVE
Chlamydia: NEGATIVE
Comment: NEGATIVE
Comment: NEGATIVE
Comment: NEGATIVE
Comment: NEGATIVE
Comment: NEGATIVE
Comment: NORMAL
Neisseria Gonorrhea: NEGATIVE
Trichomonas: POSITIVE — AB

## 2023-01-28 ENCOUNTER — Encounter: Payer: Self-pay | Admitting: Nurse Practitioner

## 2023-01-28 LAB — HIV ANTIBODY (ROUTINE TESTING W REFLEX): HIV 1&2 Ab, 4th Generation: NONREACTIVE

## 2023-01-28 LAB — RPR: RPR Ser Ql: NONREACTIVE

## 2023-01-28 MED ORDER — METRONIDAZOLE 500 MG PO TABS: 2000.0000 mg | ORAL_TABLET | Freq: Once | ORAL | 0 refills | Status: AC

## 2023-01-28 NOTE — Addendum Note (Signed)
Addended by: Leana Gamer on: 01/28/2023 08:28 AM   Modules accepted: Orders

## 2023-01-28 NOTE — Progress Notes (Signed)
Positive trichomonas. This is a sexually transmitted infection. You need to inform your partner to get treatment. I sent metronidazole which will also treat BV. You need to abstain from any intercourse till treatment is completed and your test is repeated. Call office and schedule lab app 1week after completion of treatment

## 2023-01-29 NOTE — Progress Notes (Signed)
Stable Follow instructions as discussed during office visit.

## 2023-02-08 ENCOUNTER — Other Ambulatory Visit (HOSPITAL_COMMUNITY)
Admission: RE | Admit: 2023-02-08 | Discharge: 2023-02-08 | Disposition: A | Discharge status: Home / Self Care | Payer: BC Managed Care – PPO | Source: Ambulatory Visit | Attending: Nurse Practitioner | Admitting: Nurse Practitioner

## 2023-02-08 ENCOUNTER — Other Ambulatory Visit: Payer: BC Managed Care – PPO

## 2023-02-08 DIAGNOSIS — A599 Trichomoniasis, unspecified: Secondary | ICD-10-CM | POA: Insufficient documentation

## 2023-02-08 DIAGNOSIS — N898 Other specified noninflammatory disorders of vagina: Secondary | ICD-10-CM

## 2023-02-08 NOTE — Progress Notes (Signed)
Pt is here for retest. For Cytology

## 2023-02-09 LAB — URINE CYTOLOGY ANCILLARY ONLY
Chlamydia: NEGATIVE
Comment: NEGATIVE
Comment: NEGATIVE
Comment: NORMAL
Neisseria Gonorrhea: NEGATIVE
Trichomonas: NEGATIVE

## 2023-02-09 NOTE — Addendum Note (Signed)
Addended by: Leana Gamer on: 02/09/2023 02:53 PM   Modules accepted: Orders

## 2023-02-11 ENCOUNTER — Other Ambulatory Visit (HOSPITAL_COMMUNITY)
Admission: RE | Admit: 2023-02-11 | Discharge: 2023-02-11 | Disposition: A | Discharge status: Home / Self Care | Payer: BC Managed Care – PPO | Source: Ambulatory Visit | Attending: Nurse Practitioner | Admitting: Nurse Practitioner

## 2023-02-11 DIAGNOSIS — N898 Other specified noninflammatory disorders of vagina: Secondary | ICD-10-CM | POA: Insufficient documentation

## 2023-02-12 ENCOUNTER — Encounter (HOSPITAL_COMMUNITY): Payer: Self-pay

## 2023-02-12 ENCOUNTER — Emergency Department (HOSPITAL_COMMUNITY): Payer: BC Managed Care – PPO

## 2023-02-12 ENCOUNTER — Emergency Department (HOSPITAL_COMMUNITY)
Admission: EM | Admit: 2023-02-12 | Discharge: 2023-02-12 | Disposition: A | Discharge status: Home / Self Care | Payer: BC Managed Care – PPO | Attending: Emergency Medicine | Admitting: Emergency Medicine

## 2023-02-12 ENCOUNTER — Other Ambulatory Visit: Payer: Self-pay

## 2023-02-12 DIAGNOSIS — R0789 Other chest pain: Secondary | ICD-10-CM | POA: Insufficient documentation

## 2023-02-12 DIAGNOSIS — I10 Essential (primary) hypertension: Secondary | ICD-10-CM | POA: Diagnosis not present

## 2023-02-12 DIAGNOSIS — R079 Chest pain, unspecified: Secondary | ICD-10-CM | POA: Diagnosis not present

## 2023-02-12 LAB — CBC
HCT: 37.1 % (ref 36.0–46.0)
Hemoglobin: 12.3 g/dL (ref 12.0–15.0)
MCH: 30.7 pg (ref 26.0–34.0)
MCHC: 33.2 g/dL (ref 30.0–36.0)
MCV: 92.5 fL (ref 80.0–100.0)
Platelets: 210 10*3/uL (ref 150–400)
RBC: 4.01 MIL/uL (ref 3.87–5.11)
RDW: 12.9 % (ref 11.5–15.5)
WBC: 5.6 10*3/uL (ref 4.0–10.5)
nRBC: 0 % (ref 0.0–0.2)

## 2023-02-12 LAB — BASIC METABOLIC PANEL
Anion gap: 13 (ref 5–15)
BUN: 11 mg/dL (ref 6–20)
CO2: 23 mmol/L (ref 22–32)
Calcium: 9.2 mg/dL (ref 8.9–10.3)
Chloride: 102 mmol/L (ref 98–111)
Creatinine, Ser: 0.62 mg/dL (ref 0.44–1.00)
GFR, Estimated: >60 mL/min (ref >60)
Glucose, Bld: 96 mg/dL (ref 70–99)
Potassium: 3.4 mmol/L — ABNORMAL LOW (ref 3.5–5.1)
Sodium: 138 mmol/L (ref 135–145)

## 2023-02-12 LAB — I-STAT BETA HCG BLOOD, ED (MC, WL, AP ONLY): I-stat hCG, quantitative: <5 m[IU]/mL (ref <5)

## 2023-02-12 LAB — TROPONIN I (HIGH SENSITIVITY)
Troponin I (High Sensitivity): 4 ng/L (ref <18)
Troponin I (High Sensitivity): 4 ng/L (ref <18)

## 2023-02-12 MED ORDER — KETOROLAC TROMETHAMINE 15 MG/ML IJ SOLN
15.0000 mg | Freq: Once | INTRAMUSCULAR | Status: AC
  Administered 2023-02-12: 15 mg via INTRAMUSCULAR
  Filled 2023-02-12: qty 1

## 2023-02-12 MED ORDER — POTASSIUM CHLORIDE CRYS ER 20 MEQ PO TBCR
40.0000 meq | EXTENDED_RELEASE_TABLET | Freq: Once | ORAL | Status: AC
  Administered 2023-02-12: 40 meq via ORAL
  Filled 2023-02-12: qty 2

## 2023-02-12 NOTE — ED Triage Notes (Signed)
Generalized chest pains beginning tonight while at work. Pain aggravated with movement and stretching. Says she has recently been working out and unsure if it is related.

## 2023-02-12 NOTE — ED Provider Notes (Signed)
[facility] Provider Note   CSN: [id number] Arrival date & time: 02/12/23  0119     History  Chief Complaint  Patient presents with   Chest Pain    Jessica Greene is a 37 y.o. female.  37 yo obese female with pertinent PMHx of HTN, HLD, GERD, and prediabetes presents to the ED for CP. Her CP started earlier this evening while she was at work, but she is unsure at exactly what time. Her pain is located substernally and it is described as a sharp pain that exacerbates on inspiration. She states her pain worsens with movement and is better when she is at rest. Her pain is rate at an 8/10. In addition, she says that she recently started working out about two weeks ago and is concerned that her pain may be d/t a pulled muscle. Her last workout was two days ago. Denies SOB, abdominal pain, back pain, swelling in hands or feet, syncope, recent trauma, fall, or heavy lifting at work today. Patient endorses a strong family history of heart disease as her mother passed away at the age of 29 d/t an MI. She has three sisters that have enlarged hearts that require a pacemaker. She has never seen a cardiologist nor has had an echo or stress test herself. She is a former smoker (quit 10 years ago) and she drinks occasionally.   Chest Pain      Home Medications Prior to Admission medications   Medication Sig Start Date End Date Taking? Authorizing Provider  hydrochlorothiazide (MICROZIDE) 12.5 MG capsule Take 1 capsule (12.5 mg total) by mouth daily. 09/14/22   Nche, Charlene Brooke, NP  omeprazole (PRILOSEC) 20 MG capsule Take 1 capsule (20 mg total) by mouth in the morning. 09/14/22   Nche, Charlene Brooke, NP  ondansetron (ZOFRAN) 4 MG tablet Take 1 tablet (4 mg total) by mouth every 8 (eight) hours as needed for nausea or vomiting. 07/22/22   Nche, Charlene Brooke, NP  potassium chloride SA (KLOR-CON M) 20 MEQ tablet Take 1 tablet (20 mEq total) by  mouth daily. 12/17/22   Nche, Charlene Brooke, NP  pravastatin (PRAVACHOL) 20 MG tablet Take 1 tablet (20 mg total) by mouth at bedtime. 12/17/22   Nche, Charlene Brooke, NP  sertraline (ZOLOFT) 25 MG tablet Take 1 tablet (25 mg total) by mouth daily. 01/26/23   Nche, Charlene Brooke, NP  tirzepatide (ZEPBOUND) 5 MG/0.5ML Pen Inject 5 mg into the skin once a week. 01/26/23   Nche, Charlene Brooke, NP      Allergies    Patient has no known allergies.    Review of Systems   Review of Systems  Cardiovascular:  Positive for chest pain.  Ten systems reviewed and are negative for acute change, except as noted in the HPI.    Physical Exam Updated Vital Signs BP 106/76   Pulse 77   Temp 98.2 F (36.8 C)   Resp 16   Ht 5\' 8"  (1.727 m)   Wt 120.2 kg   LMP 01/14/2023 (Approximate)   SpO2 100%   BMI 40.29 kg/m   Physical Exam Vitals and nursing note reviewed.  Constitutional:      General: She is not in acute distress.    Appearance: She is well-developed. She is not diaphoretic.     Comments: Nontoxic appearing and in NAD  HENT:     Head: Normocephalic and atraumatic.  Eyes:     General: No scleral icterus.  Conjunctiva/sclera: Conjunctivae normal.  Cardiovascular:     Rate and Rhythm: Normal rate and regular rhythm.     Pulses: Normal pulses.  Pulmonary:     Effort: Pulmonary effort is normal. No respiratory distress.     Breath sounds: No stridor. No wheezing.     Comments: Lungs CTAB. Chest:     Chest wall: Tenderness present.  Musculoskeletal:        General: Normal range of motion.     Cervical back: Normal range of motion.  Skin:    General: Skin is warm and dry.     Coloration: Skin is not pale.     Findings: No erythema or rash.  Neurological:     Mental Status: She is alert and oriented to person, place, and time.     Coordination: Coordination normal.  Psychiatric:        Behavior: Behavior normal.     ED Results / Procedures / Treatments   Labs (all labs ordered  are listed, but only abnormal results are displayed) Labs Reviewed  BASIC METABOLIC PANEL - Abnormal; Notable for the following components:      Result Value   Potassium 3.4 (*)    All other components within normal limits  CBC  I-STAT BETA HCG BLOOD, ED ([facility], [facility], [facility] ONLY)  TROPONIN I (HIGH SENSITIVITY)  TROPONIN I (HIGH SENSITIVITY)    EKG EKG Interpretation  Date/Time:  Friday February 12 2023 01:35:43 EDT Ventricular Rate:  64 PR Interval:  144 QRS Duration: 86 QT Interval:  454 QTC Calculation: 468 R Axis:   31 Text Interpretation: Normal sinus rhythm with sinus arrhythmia ST & T wave abnormality, consider inferior ischemia Prolonged QT Abnormal ECG no change compared with tracing in 2019 Confirmed by Orpah Greek [phone]) on 02/12/2023 5:11:41 AM  Radiology DG Chest 2 View  Result Date: 02/12/2023 CLINICAL DATA:  Chest pain EXAM: CHEST - 2 VIEW COMPARISON:  None Available. FINDINGS: The heart size and mediastinal contours are within normal limits. Both lungs are clear. The visualized skeletal structures are unremarkable. IMPRESSION: No active cardiopulmonary disease. Electronically Signed   By: Fidela Salisbury M.D.   On: 02/12/2023 02:14    Procedures Procedures    Medications Ordered in ED Medications  ketorolac (TORADOL) 15 MG/ML injection 15 mg (15 mg Intramuscular Given 02/12/23 0256)  potassium chloride SA (KLOR-CON M) CR tablet 40 mEq (40 mEq Oral Given 02/12/23 [id number])    ED Course/ Medical Decision Making/ A&P                             Medical Decision Making Amount and/or Complexity of Data Reviewed Labs: ordered. Radiology: ordered.  Risk Prescription drug management.   This patient presents to the ED for concern of chest pain, this involves an extensive number of treatment options, and is a complaint that carries with it a high risk of complications and morbidity.  The differential diagnosis includes MSK vs ACS vs PTX vs pleural effusion vs PNA vs  PE.   Co morbidities that complicate the patient evaluation  HTN HLD GERD preDM   Additional history obtained:  External records from outside source obtained and reviewed including PCP visit from 01/26/23 for vaginal c/o   Lab Tests:  I Ordered, and personally interpreted labs.  The pertinent results include:  K 3.4 (chronic, stable)   Imaging Studies ordered:  I ordered imaging studies including CXR  I independently visualized and  interpreted imaging which showed no acute abnormality I agree with the radiologist interpretation   Cardiac Monitoring:  The patient was maintained on a cardiac monitor.  I personally viewed and interpreted the cardiac monitored which showed an underlying rhythm of: NSR   Medicines ordered and prescription drug management:  I ordered medication including Toradol for pain  Reevaluation of the patient after these medicines showed that the patient improved I have reviewed the patients home medicines and have made adjustments as needed   Test Considered:  Mg   Problem List / ED Course:  Low suspicion for emergent cardiac etiology given reassuring workup today.  EKG is nonischemic and troponin negative x2.   Chest x-ray without evidence of mediastinal widening to suggest dissection.  No pneumothorax, pneumonia, pleural effusion.   Pulmonary embolus further considered; however, patient without tachycardia, tachypnea, dyspnea, hypoxia.  Patient is PERC negative. Suspect MSK etiology given new work out regimen and reproducibility or pain on palpation.   Reevaluation:  After the interventions noted above, I reevaluated the patient and found that they have :improved   Dispostion:  Considered admission, however, given atypical nature of symptoms, reproducibility on exam, diagnostic results and the patient's response to treatment, I feel that close outpatient f/u is reasonable. Counseled on use of NSAIDs for residual pain. Will refer to  outpatient cardiology; encouraged f/u within the next month. Return precautions discussed and provided. Patient discharged in stable condition with no unaddressed concerns.          Final Clinical Impression(s) / ED Diagnoses Final diagnoses:  Atypical chest pain    Rx / DC Orders ED Discharge Orders          Ordered    Ambulatory referral to Cardiology        02/12/23 0500              Antonietta Breach, PA-C 02/12/23 0516    Orpah Greek, MD 02/13/23 [phone]

## 2023-02-12 NOTE — ED Notes (Signed)
Pt presents ambulatory to room with steady gait c/o generalized diffuse chest discomfort that began yesterday. Pt has hx of gerd and also has just started working out and is not sure of those may be contributing factors. Respirations even and non labored monitor shows nsr pt in no distress at present time

## 2023-02-12 NOTE — Discharge Instructions (Signed)
Your workup in the emergency department today was reassuring.  Given your family history and risk factors, we do advise that you follow-up with a cardiologist.  This should ideally occur within the next 30 days.  You may benefit from an outpatient stress test.  In the interim, follow-up with your primary care doctor.  You may use ibuprofen or Aleve for management of persistent discomfort.  Return for new or concerning symptoms.

## 2023-02-15 LAB — CERVICOVAGINAL ANCILLARY ONLY
Bacterial Vaginitis (gardnerella): NEGATIVE
Candida Glabrata: NEGATIVE
Candida Vaginitis: NEGATIVE
Comment: NEGATIVE
Comment: NEGATIVE
Comment: NEGATIVE

## 2023-02-17 ENCOUNTER — Encounter: Payer: Self-pay | Admitting: Nurse Practitioner

## 2023-02-19 ENCOUNTER — Other Ambulatory Visit: Payer: Self-pay | Admitting: Nurse Practitioner

## 2023-02-19 MED ORDER — ZEPBOUND 2.5 MG/0.5ML ~~LOC~~ SOAJ: 2.5000 mg | SUBCUTANEOUS | 1 refills | Status: DC

## 2023-02-22 DIAGNOSIS — Z419 Encounter for procedure for purposes other than remedying health state, unspecified: Secondary | ICD-10-CM | POA: Diagnosis not present

## 2023-02-25 ENCOUNTER — Other Ambulatory Visit: Payer: Self-pay | Admitting: Nurse Practitioner

## 2023-02-25 DIAGNOSIS — F411 Generalized anxiety disorder: Secondary | ICD-10-CM

## 2023-03-02 MED ORDER — ZEPBOUND 7.5 MG/0.5ML ~~LOC~~ SOAJ: 7.5000 mg | SUBCUTANEOUS | 0 refills | Status: DC

## 2023-03-02 NOTE — Addendum Note (Signed)
Addended by: Michaela Corner on: 03/02/2023 03:49 PM   Modules accepted: Orders

## 2023-03-03 ENCOUNTER — Ambulatory Visit: Payer: BC Managed Care – PPO | Attending: Internal Medicine | Admitting: Internal Medicine

## 2023-03-03 NOTE — Progress Notes (Deleted)
Cardiology Office Note:    Date:  03/03/2023   ID:  Sebastiana, Shafran 1986-10-16, MRN [id number]  PCP:  Anne Ng, NP   [facility] Providers Cardiologist:  None { Click to update primary MD,subspecialty MD or APP then REFRESH:1}    Referring MD: Antony Madura, PA-C   No chief complaint on file. ***  History of Present Illness:    Jessica Greene is a 37 y.o. female with a hx of HTN, GERD, pre-DM, was seen in the ED 02/12/2023 for CP. She had sharp chest pain  Past Medical History:  Diagnosis Date   Diabetes mellitus without complication (HCC)    GERD (gastroesophageal reflux disease)    Hernia of abdominal cavity    High cholesterol    Hypertension    Polycystic ovarian syndrome    Uterine polyp 2017    Past Surgical History:  Procedure Laterality Date   ESOPHAGOGASTRODUODENOSCOPY ENDOSCOPY  2013   POLYPECTOMY  06/29/2016   s/p hysteroscopic polypectomy     Current Medications: No outpatient medications have been marked as taking for the 03/03/23 encounter (Appointment) with Maisie Fus, MD.     Allergies:   Patient has no known allergies.   Social History   Socioeconomic History   Marital status: Married    Spouse name: Not on file   Number of children: Not on file   Years of education: Not on file   Highest education level: Not on file  Occupational History   Not on file  Tobacco Use   Smoking status: Former   Smokeless tobacco: Never  Vaping Use   Vaping Use: Never used  Substance and Sexual Activity   Alcohol use: Yes    Comment: occasionally   Drug use: Yes    Frequency: 1.0 times per week    Types: Marijuana    Comment: use of extasy in past, last used 2009   Sexual activity: Yes    Partners: Male    Birth control/protection: None  Other Topics Concern   Not on file  Social History Narrative   Not on file   Social Determinants of Health   Financial Resource Strain: Not on file  Food Insecurity: Not  on file  Transportation Needs: Not on file  Physical Activity: Not on file  Stress: Not on file  Social Connections: Not on file     Family History: The patient's ***family history includes Bipolar disorder in her sister; Birth defects in her paternal grandfather; Cancer in her father; Depression in her sister; Diabetes in her father and mother; Heart disease in her father, maternal aunt, maternal grandfather, maternal grandmother, maternal uncle, mother, paternal aunt, paternal grandfather, paternal grandmother, paternal uncle, and sister; Hyperlipidemia in her father and mother; Hypertension in her father and mother; Stroke in her father.  ROS:   Please see the history of present illness.    *** All other systems reviewed and are negative.  EKGs/Labs/Other Studies Reviewed:    The following studies were reviewed today: ***  EKG:  EKG is *** ordered today.  The ekg ordered today demonstrates ***  Recent Labs: 07/19/2022: ALT 22 02/12/2023: BUN 11; Creatinine, Ser 0.62; Hemoglobin 12.3; Platelets 210; Potassium 3.4; Sodium 138  Recent Lipid Panel    Component Value Date/Time   CHOL 200 12/15/2022 1025   TRIG 121.0 12/15/2022 1025   HDL 37.90 (L) 12/15/2022 1025   CHOLHDL 5 12/15/2022 1025   VLDL 24.2 12/15/2022 1025   LDLCALC 138 (H) 12/15/2022  1025   LDLDIRECT 127.0 06/17/2022 1411     Risk Assessment/Calculations:   {Does this patient have ATRIAL FIBRILLATION?:[phone]}  No BP recorded.  {Refresh Note OR Click here to enter BP  :1}***         Physical Exam:    VS:  There were no vitals taken for this visit.    Wt Readings from Last 3 Encounters:  02/12/23 265 lb (120.2 kg)  01/26/23 241 lb 12.8 oz (109.7 kg)  12/15/22 253 lb 3.2 oz (114.9 kg)     GEN: *** Well nourished, well developed in no acute distress HEENT: Normal NECK: No JVD; No carotid bruits LYMPHATICS: No lymphadenopathy CARDIAC: ***RRR, no murmurs, rubs, gallops RESPIRATORY:  Clear to  auscultation without rales, wheezing or rhonchi  ABDOMEN: Soft, non-tender, non-distended MUSCULOSKELETAL:  No edema; No deformity  SKIN: Warm and dry NEUROLOGIC:  Alert and oriented x 3 PSYCHIATRIC:  Normal affect   ASSESSMENT:    No diagnosis found. PLAN:    In order of problems listed above:  ***      {Are you ordering a CV Procedure (e.g. stress test, cath, DCCV, TEE, etc)?   Press F2        :[id number]}    Medication Adjustments/Labs and Tests Ordered: Current medicines are reviewed at length with the patient today.  Concerns regarding medicines are outlined above.  No orders of the defined types were placed in this encounter.  No orders of the defined types were placed in this encounter.   There are no Patient Instructions on file for this visit.   Signed, Maisie Fus, MD  03/03/2023 1:34 PM    [facility]

## 2023-03-03 NOTE — Telephone Encounter (Signed)
Pt stated that it is time for her dose to go up and she would like to talk about this and where to send the refill for ZEPbound

## 2023-03-15 ENCOUNTER — Other Ambulatory Visit (HOSPITAL_COMMUNITY): Payer: Self-pay

## 2023-03-15 ENCOUNTER — Encounter: Payer: Self-pay | Admitting: Nurse Practitioner

## 2023-03-15 ENCOUNTER — Ambulatory Visit (INDEPENDENT_AMBULATORY_CARE_PROVIDER_SITE_OTHER): Payer: BC Managed Care – PPO | Admitting: Nurse Practitioner

## 2023-03-15 VITALS — BP 124/80 | HR 68 | Temp 97.9°F | Resp 16 | Ht 68.0 in | Wt 227.4 lb

## 2023-03-15 DIAGNOSIS — E876 Hypokalemia: Secondary | ICD-10-CM

## 2023-03-15 DIAGNOSIS — R7303 Prediabetes: Secondary | ICD-10-CM | POA: Diagnosis not present

## 2023-03-15 DIAGNOSIS — Z6838 Body mass index (BMI) 38.0-38.9, adult: Secondary | ICD-10-CM | POA: Diagnosis not present

## 2023-03-15 DIAGNOSIS — G8929 Other chronic pain: Secondary | ICD-10-CM

## 2023-03-15 DIAGNOSIS — M5441 Lumbago with sciatica, right side: Secondary | ICD-10-CM | POA: Diagnosis not present

## 2023-03-15 DIAGNOSIS — E559 Vitamin D deficiency, unspecified: Secondary | ICD-10-CM

## 2023-03-15 DIAGNOSIS — K5903 Drug induced constipation: Secondary | ICD-10-CM

## 2023-03-15 DIAGNOSIS — L235 Allergic contact dermatitis due to other chemical products: Secondary | ICD-10-CM

## 2023-03-15 MED ORDER — ZEPBOUND 2.5 MG/0.5ML ~~LOC~~ SOAJ
2.5000 mg | SUBCUTANEOUS | 0 refills | Status: DC
  Filled 2023-03-15 – 2023-03-16 (×4): qty 2, 28d supply, fill #0

## 2023-03-15 MED ORDER — TIZANIDINE HCL 4 MG PO TABS
4.0000 mg | ORAL_TABLET | Freq: Every evening | ORAL | 1 refills | Status: DC | PRN
  Filled 2023-03-15: qty 30, 30d supply, fill #0

## 2023-03-15 NOTE — Patient Instructions (Signed)
Go to lab Start miralax 17g daily for constipation Maintain 60oz of water daily Eat 2servings of vegetables daily. Continue heart healthy diet and exercise

## 2023-03-15 NOTE — Assessment & Plan Note (Signed)
Chronic, waxing and waning, improves with use of ibuprofen, rest and muscle relaxant. Secondary to poor posture at work No weakness or paresthesia or saddle presthesia.  Zanaflex refill sent Continue ibuprofen OTC prn Advised about need for proper body mechanics and stretches before and after exercise

## 2023-03-15 NOTE — Assessment & Plan Note (Addendum)
Lost 14lbs in last 1110month Lost total of 42lbs in last 110month Wegovy switched to zepbound 2.5mg due to GI side effects (nausea and GERD). Reports improved but intermittent nausea and GERD, worse with large or greasy meals. Also reports constipation with zepbound Exercise: 3-4x/week, walking and strength training Diet: small meal portion and heart healthy diet mostly, avoids sugary drinks Admits she does not consume adequate amount of water Wt Readings from Last 3 Encounters:  03/15/23 227 lb 6.4 oz (103.1 kg)  02/12/23 265 lb (120.2 kg)  01/26/23 241 lb 12.8 oz (109.7 kg)    Advised to increase oral hydration at least 60oz daily, eat 2servings of vegetables daily, and use miralax once a day. Check CMP, TSH, hgbA1c and vit. D Maintain zepbound dose at 2.5mg  F/up in 3months

## 2023-03-15 NOTE — Progress Notes (Signed)
Established Patient Visit  Patient: Jessica Greene   DOB: 1986/04/21   37 y.o. Female  MRN: [id number] Visit Date: 03/15/2023  Subjective:    Chief Complaint  Patient presents with   Eczema    She wants to talk about the eczema on her hand and sciatic nerve pain.  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 Improves with moisturizing skin. No rash noted  Chronic right-sided low back pain with right-sided sciatica Chronic, waxing and waning, improves with use of ibuprofen, rest and muscle relaxant. Secondary to poor posture at work No weakness or paresthesia or saddle presthesia.  Zanaflex refill sent Continue ibuprofen OTC prn Advised about need for proper body mechanics and stretches before and after exercise   Prediabetes Repeat hgbA1c  Obesity Lost 14lbs in last 50month Lost total of 42lbs in last 23month Wegovy switched to zepbound 2.5mg due to GI side effects (nausea and GERD). Reports improved but intermittent nausea and GERD, worse with large or greasy meals. Also reports constipation with zepbound Exercise: 3-4x/week, walking and strength training Diet: small meal portion and heart healthy diet mostly, avoids sugary drinks Admits she does not consume adequate amount of water Wt Readings from Last 3 Encounters:  03/15/23 227 lb 6.4 oz (103.1 kg)  02/12/23 265 lb (120.2 kg)  01/26/23 241 lb 12.8 oz (109.7 kg)    Advised to increase oral hydration at least 60oz daily, eat 2servings of vegetables daily, and use miralax once a day. Check CMP, TSH, hgbA1c and vit. D Maintain zepbound dose at 2.5mg  F/up in 3months  Wt Readings from Last 3 Encounters:  03/15/23 227 lb 6.4 oz (103.1 kg)  02/12/23 265 lb (120.2 kg)  01/26/23 241 lb 12.8 oz (109.7 kg)    BP Readings from Last 3 Encounters:  03/15/23 124/80  02/12/23 106/76  01/26/23 112/71     Reviewed medical, surgical, and  social history today  Medications: Outpatient Medications Prior to Visit  Medication Sig   hydrochlorothiazide (MICROZIDE) 12.5 MG capsule Take 1 capsule (12.5 mg total) by mouth daily.   omeprazole (PRILOSEC) 20 MG capsule Take 1 capsule (20 mg total) by mouth in the morning.   ondansetron (ZOFRAN) 4 MG tablet Take 1 tablet (4 mg total) by mouth every 8 (eight) hours as needed for nausea or vomiting.   potassium chloride SA (KLOR-CON M) 20 MEQ tablet Take 1 tablet (20 mEq total) by mouth daily.   pravastatin (PRAVACHOL) 20 MG tablet Take 1 tablet (20 mg total) by mouth at bedtime.   sertraline (ZOLOFT) 25 MG tablet Take 1 tablet (25 mg total) by mouth daily.   [DISCONTINUED] tirzepatide (ZEPBOUND) 7.5 MG/0.5ML Pen Inject 7.5 mg into the skin once a week.   [DISCONTINUED] metroNIDAZOLE (FLAGYL) 500 MG tablet Take 2,000 mg by mouth once. (Patient not taking: Reported on 03/15/2023)   No facility-administered medications prior to visit.   Reviewed past medical and social history.   ROS per HPI above      Objective:  BP 124/80 (BP Location: Right Arm, Patient Position: Sitting, Cuff Size: Large)   Pulse 68   Temp 97.9 F (36.6 C) (Temporal)   Resp 16   Ht  (1.727 m)   Wt 227 lb 6.4 oz (103.1 kg)   SpO2 100%   BMI 34.58 kg/m      Physical  Exam Cardiovascular:     Rate and Rhythm: Normal rate and regular rhythm.     Pulses: Normal pulses.     Heart sounds: Normal heart sounds.  Pulmonary:     Effort: Pulmonary effort is normal.     Breath sounds: Normal breath sounds.  Neurological:     Mental Status: She is alert and oriented to person, place, and time.     No results found for any visits on 03/15/23.    Assessment & Plan:    Problem List Items Addressed This Visit       Nervous and Auditory   Chronic right-sided low back pain with right-sided sciatica    Chronic, waxing and waning, improves with use of ibuprofen, rest and muscle relaxant. Secondary to poor  posture at work No weakness or paresthesia or saddle presthesia.  Zanaflex refill sent Continue ibuprofen OTC prn Advised about need for proper body mechanics and stretches before and after exercise       Relevant Medications   tiZANidine (ZANAFLEX) 4 MG tablet   tirzepatide (ZEPBOUND) 2.5 MG/0.5ML Pen     Other   Obesity - Primary    Lost 14lbs in last 46month Lost total of 42lbs in last 70month Wegovy switched to zepbound 2.5mg due to GI side effects (nausea and GERD). Reports improved but intermittent nausea and GERD, worse with large or greasy meals. Also reports constipation with zepbound Exercise: 3-4x/week, walking and strength training Diet: small meal portion and heart healthy diet mostly, avoids sugary drinks Admits she does not consume adequate amount of water Wt Readings from Last 3 Encounters:  03/15/23 227 lb 6.4 oz (103.1 kg)  02/12/23 265 lb (120.2 kg)  01/26/23 241 lb 12.8 oz (109.7 kg)    Advised to increase oral hydration at least 60oz daily, eat 2servings of vegetables daily, and use miralax once a day. Check CMP, TSH, hgbA1c and vit. D Maintain zepbound dose at 2.5mg  F/up in 3months      Relevant Medications   tirzepatide (ZEPBOUND) 2.5 MG/0.5ML Pen   Other Relevant Orders   Comprehensive metabolic panel   Hemoglobin A1c   VITAMIN D 25 Hydroxy (Vit-D Deficiency, Fractures)   TSH   Prediabetes    Repeat hgbA1c      Relevant Orders   Hemoglobin A1c   Other Visit Diagnoses     Drug-induced constipation       Relevant Orders   TSH   Allergic dermatitis due to other chemical product          Return in about 3 months (around 06/14/2023) for Weight management, prediabetes, HTN, hyperlipidemia (fasting).     Alysia Penna, NP

## 2023-03-15 NOTE — Assessment & Plan Note (Signed)
Repeat hgbA1c 

## 2023-03-16 ENCOUNTER — Encounter: Payer: Self-pay | Admitting: Internal Medicine

## 2023-03-16 ENCOUNTER — Ambulatory Visit: Payer: BC Managed Care – PPO | Attending: Internal Medicine | Admitting: Internal Medicine

## 2023-03-16 ENCOUNTER — Other Ambulatory Visit (HOSPITAL_COMMUNITY): Payer: Self-pay

## 2023-03-16 VITALS — BP 118/82 | HR 72 | Ht 68.0 in | Wt 230.0 lb

## 2023-03-16 DIAGNOSIS — R079 Chest pain, unspecified: Secondary | ICD-10-CM | POA: Diagnosis not present

## 2023-03-16 LAB — COMPREHENSIVE METABOLIC PANEL
ALT: 22 U/L (ref 0–35)
AST: 19 U/L (ref 0–37)
Albumin: 4.1 g/dL (ref 3.5–5.2)
Alkaline Phosphatase: 66 U/L (ref 39–117)
BUN: 7 mg/dL (ref 6–23)
CO2: 28 mEq/L (ref 19–32)
Calcium: 8.9 mg/dL (ref 8.4–10.5)
Chloride: 103 mEq/L (ref 96–112)
Creatinine, Ser: 0.62 mg/dL (ref 0.40–1.20)
GFR: 113.46 mL/min (ref >60.00)
Glucose, Bld: 81 mg/dL (ref 70–99)
Potassium: 3.3 mEq/L — ABNORMAL LOW (ref 3.5–5.1)
Sodium: 138 mEq/L (ref 135–145)
Total Bilirubin: 0.4 mg/dL (ref 0.2–1.2)
Total Protein: 6.9 g/dL (ref 6.0–8.3)

## 2023-03-16 LAB — HEMOGLOBIN A1C: Hgb A1c MFr Bld: 5.8 % (ref 4.6–6.5)

## 2023-03-16 LAB — VITAMIN D 25 HYDROXY (VIT D DEFICIENCY, FRACTURES): VITD: 8.53 ng/mL — ABNORMAL LOW (ref 30.00–100.00)

## 2023-03-16 LAB — TSH: TSH: 1.01 u[IU]/mL (ref 0.35–5.50)

## 2023-03-16 MED ORDER — VITAMIN D (ERGOCALCIFEROL) 1.25 MG (50000 UNIT) PO CAPS
50000.0000 [IU] | ORAL_CAPSULE | ORAL | 0 refills | Status: DC
  Filled 2023-03-16: qty 4, 28d supply, fill #0

## 2023-03-16 MED ORDER — POTASSIUM CHLORIDE CRYS ER 20 MEQ PO TBCR
EXTENDED_RELEASE_TABLET | ORAL | 1 refills | Status: DC
  Filled 2023-03-16: qty 35, 30d supply, fill #0

## 2023-03-16 NOTE — Progress Notes (Signed)
Cardiology Office Note:    Date:  03/16/2023   ID:  Jessica, Greene 02-May-1986, MRN [id number]  PCP:  Anne Ng, NP   [facility] Providers Cardiologist:  Maisie Fus, MD     Referring MD: Antony Madura, PA-C   No chief complaint on file. Atypical CP 03/16/2023  History of Present Illness:    Jessica Greene is a 37 y.o. female with a hx of GERD, A1c 6.1, referral from the ED for CP 02/12/2023. She said every time she moved , she felt chest pain. So she left work. She said about 8/10. It lasted a long while. She had K 3.4.  She was given pain medicine.  She thinks it is her GERD or gas. Vasovagal syncope from the heat. She can have anxiety, and can get SOB. She is losing weight. No significant edema. No Pnd/orthopnea.  Blood pressure is well controlled  [place] moved here from [place] in [place]. She is a Merchandiser, retail at [employer]. She quit smoking in 2010. Occasional wine.  Mother had MI at 63, she had CHF and a device. She died afterwards.  Sister has a device. Sister with an enlarged heart in her 64s.  Father had a device and stroke which led to his death.   EKG- NSR, non specific st-t changes in the inferior leads seen in 2019  Past Medical History:  Diagnosis Date   Diabetes mellitus without complication    GERD (gastroesophageal reflux disease)    Hernia of abdominal cavity    High cholesterol    Hypertension    Polycystic ovarian syndrome    Uterine polyp 2017    Past Surgical History:  Procedure Laterality Date   ESOPHAGOGASTRODUODENOSCOPY ENDOSCOPY  2013   POLYPECTOMY  06/29/2016   s/p hysteroscopic polypectomy     Current Medications: Current Outpatient Medications on File Prior to Visit  Medication Sig Dispense Refill   hydrochlorothiazide (MICROZIDE) 12.5 MG capsule Take 1 capsule (12.5 mg total) by mouth daily. 90 capsule 1   omeprazole (PRILOSEC) 20 MG capsule Take 1 capsule (20 mg total) by mouth in the morning. 90 capsule 0    ondansetron (ZOFRAN) 4 MG tablet Take 1 tablet (4 mg total) by mouth every 8 (eight) hours as needed for nausea or vomiting. 30 tablet 0   pravastatin (PRAVACHOL) 20 MG tablet Take 1 tablet (20 mg total) by mouth at bedtime. 90 tablet 1   sertraline (ZOLOFT) 25 MG tablet Take 1 tablet (25 mg total) by mouth daily. 30 tablet 5   tirzepatide (ZEPBOUND) 2.5 MG/0.5ML Pen Inject 2.5 mg into the skin once a week. 2 mL 0   tiZANidine (ZANAFLEX) 4 MG tablet Take 1 tablet (4 mg) by mouth at bedtime as needed for muscle spasms. 30 tablet 1   potassium chloride SA (KLOR-CON M) 20 MEQ tablet Take 1 tablet (20 mEq total) by mouth daily. (Patient not taking: Reported on 03/16/2023) 90 tablet 1   No current facility-administered medications on file prior to visit.     Allergies:   Patient has no known allergies.   Social History   Socioeconomic History   Marital status: Married    Spouse name: Not on file   Number of children: Not on file   Years of education: Not on file   Highest education level: Not on file  Occupational History   Not on file  Tobacco Use   Smoking status: Former   Smokeless tobacco: Never  Vaping Use   Vaping  Use: Never used  Substance and Sexual Activity   Alcohol use: Yes    Comment: occasionally   Drug use: Yes    Frequency: 1.0 times per week    Types: Marijuana    Comment: use of extasy in past, last used 2009   Sexual activity: Yes    Partners: Male    Birth control/protection: None  Other Topics Concern   Not on file  Social History Narrative   Not on file   Social Determinants of Health   Financial Resource Strain: Not on file  Food Insecurity: Not on file  Transportation Needs: Not on file  Physical Activity: Not on file  Stress: Not on file  Social Connections: Not on file     Family History: The patient's family history includes Bipolar disorder in her sister; Birth defects in her paternal grandfather; Cancer in her father; Depression in her  sister; Diabetes in her father and mother; Heart disease in her father, maternal aunt, maternal grandfather, maternal grandmother, maternal uncle, mother, paternal aunt, paternal grandfather, paternal grandmother, paternal uncle, and sister; Hyperlipidemia in her father and mother; Hypertension in her father and mother; Stroke in her father.  ROS:   Please see the history of present illness.     All other systems reviewed and are negative.  EKGs/Labs/Other Studies Reviewed:    The following studies were reviewed today:   EKG:  EKG is  ordered today.  The ekg ordered today demonstrates   03/16/2023- NSR, inferior st-t changes , unchanged  Recent Labs: 07/19/2022: ALT 22 02/12/2023: BUN 11; Creatinine, Ser 0.62; Hemoglobin 12.3; Platelets 210; Potassium 3.4; Sodium 138   Recent Lipid Panel    Component Value Date/Time   CHOL 200 12/15/2022 1025   TRIG 121.0 12/15/2022 1025   HDL 37.90 (L) 12/15/2022 1025   CHOLHDL 5 12/15/2022 1025   VLDL 24.2 12/15/2022 1025   LDLCALC 138 (H) 12/15/2022 1025   LDLDIRECT 127.0 06/17/2022 1411     Risk Assessment/Calculations:    Physical Exam:    VS:  Vitals:   03/16/23 0840  BP: 118/82  Pulse: 72  SpO2: 97%    Wt Readings from Last 3 Encounters:  03/16/23 230 lb (104.3 kg)  03/15/23 227 lb 6.4 oz (103.1 kg)  02/12/23 265 lb (120.2 kg)     GEN:  Well nourished, well developed in no acute distress HEENT: Normal NECK: No JVD;  LYMPHATICS: No lymphadenopathy CARDIAC: RRR, no murmurs, rubs, gallops RESPIRATORY:  Clear to auscultation without rales, wheezing or rhonchi  ABDOMEN: non-tender, non-distended MUSCULOSKELETAL:  No edema; No deformity  SKIN: Warm and dry NEUROLOGIC:  Alert and oriented x 3 PSYCHIATRIC:  Normal affect   ASSESSMENT:    CP: 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 Symptoms were atypical for coronary dx and she is low risk with age.  Family hx of CM: She has significant family hx of  cardiomyopathy at a young age. Will get a TTE PLAN:    In order of problems listed above:  TTE      Medication Adjustments/Labs and Tests Ordered: Current medicines are reviewed at length with the patient today.  Concerns regarding medicines are outlined above.  Orders Placed This Encounter  Procedures   EKG 12-Lead   ECHOCARDIOGRAM COMPLETE   No orders of the defined types were placed in this encounter.   Patient Instructions  Medication Instructions:  No changes     Lab Work:  Not needed  Testing/Procedures:  Will be schedule  at [address]  Your physician has requested that you have an echocardiogram. Echocardiography is a painless test that uses sound waves to create images of your heart. It provides your doctor with information about the size and shape of your heart and how well your heart's chambers and valves are working. This procedure takes approximately one hour. There are no restrictions for this procedure. Please do NOT wear cologne, perfume, aftershave, or lotions (deodorant is allowed). Please arrive 15 minutes prior to your appointment time.    Follow-Up: At [facility], you and your health needs are our priority.  As part of our continuing mission to provide you with exceptional heart care, we have created designated Provider Care Teams.  These Care Teams include your primary Cardiologist (physician) and Advanced Practice Providers (APPs -  Physician Assistants and Nurse Practitioners) who all work together to provide you with the care you need, when you need it.     Your next appointment:   As needed  The format for your next appointment:   In Person  Provider:   Maisie Fus, MD       Signed, Maisie Fus, MD  03/16/2023 9:13 AM    [facility]

## 2023-03-16 NOTE — Patient Instructions (Signed)
Medication Instructions:  No changes     Lab Work:  Not needed  Testing/Procedures:  Will be schedule  at [facility] [address]  Your physician has requested that you have an echocardiogram. Echocardiography is a painless test that uses sound waves to create images of your heart. It provides your doctor with information about the size and shape of your heart and how well your heart's chambers and valves are working. This procedure takes approximately one hour. There are no restrictions for this procedure. Please do NOT wear cologne, perfume, aftershave, or lotions (deodorant is allowed). Please arrive 15 minutes prior to your appointment time.    Follow-Up: At [facility], you and your health needs are our priority.  As part of our continuing mission to provide you with exceptional heart care, we have created designated Provider Care Teams.  These Care Teams include your primary Cardiologist (physician) and Advanced Practice Providers (APPs -  Physician Assistants and Nurse Practitioners) who all work together to provide you with the care you need, when you need it.     Your next appointment:   As needed  The format for your next appointment:   In Person  Provider:   Maisie Fus, MD

## 2023-03-16 NOTE — Addendum Note (Signed)
Addended by: Michaela Corner on: 03/16/2023 03:21 PM   Modules accepted: Orders

## 2023-03-17 ENCOUNTER — Other Ambulatory Visit (HOSPITAL_COMMUNITY): Payer: Self-pay

## 2023-03-24 ENCOUNTER — Encounter: Payer: Self-pay | Admitting: Nurse Practitioner

## 2023-03-24 DIAGNOSIS — R7303 Prediabetes: Secondary | ICD-10-CM

## 2023-03-24 DIAGNOSIS — I10 Essential (primary) hypertension: Secondary | ICD-10-CM

## 2023-03-24 DIAGNOSIS — Z419 Encounter for procedure for purposes other than remedying health state, unspecified: Secondary | ICD-10-CM | POA: Diagnosis not present

## 2023-03-24 DIAGNOSIS — E782 Mixed hyperlipidemia: Secondary | ICD-10-CM

## 2023-03-31 MED ORDER — HYDROCHLOROTHIAZIDE 12.5 MG PO CAPS: 12.5000 mg | ORAL_CAPSULE | Freq: Every day | ORAL | 1 refills | Status: DC

## 2023-04-09 MED ORDER — ZEPBOUND 5 MG/0.5ML ~~LOC~~ SOAJ: 5.0000 mg | SUBCUTANEOUS | 1 refills | Status: DC

## 2023-04-09 NOTE — Addendum Note (Signed)
Addended by: Alysia Penna L on: 04/09/2023 02:28 PM   Modules accepted: Orders

## 2023-04-13 ENCOUNTER — Ambulatory Visit (HOSPITAL_COMMUNITY): Payer: BC Managed Care – PPO

## 2023-04-24 ENCOUNTER — Telehealth: Payer: BC Managed Care – PPO | Admitting: Physician Assistant

## 2023-04-24 DIAGNOSIS — Z419 Encounter for procedure for purposes other than remedying health state, unspecified: Secondary | ICD-10-CM | POA: Diagnosis not present

## 2023-04-24 DIAGNOSIS — K0889 Other specified disorders of teeth and supporting structures: Secondary | ICD-10-CM | POA: Diagnosis not present

## 2023-04-25 MED ORDER — IBUPROFEN 600 MG PO TABS: 600.0000 mg | ORAL_TABLET | Freq: Three times a day (TID) | ORAL | 0 refills | Status: DC | PRN

## 2023-04-25 MED ORDER — AMOXICILLIN-POT CLAVULANATE 875-125 MG PO TABS: 1.0000 | ORAL_TABLET | Freq: Two times a day (BID) | ORAL | 0 refills | Status: AC

## 2023-04-25 NOTE — Progress Notes (Signed)
E-Visit for Dental Pain  We are sorry that you are not feeling well.  Here is how we plan to help!  Based on what you have shared with me in the questionnaire, it sounds like you may have a dental infection.   Augmentin 875-125mg  twice a day for 7 days and Ibuprofen 600mg  3 times a day for 7 days for discomfort  It is imperative that you see a dentist within 10 days of this eVisit to determine the cause of the dental pain and be sure it is adequately treated  A toothache or tooth pain is caused when the nerve in the root of a tooth or surrounding a tooth is irritated. Dental (tooth) infection, decay, injury, or loss of a tooth are the most common causes of dental pain. Pain may also occur after an extraction (tooth is pulled out). Pain sometimes originates from other areas and radiates to the jaw, thus appearing to be tooth pain.Bacteria growing inside your mouth can contribute to gum disease and dental decay, both of which can cause pain. A toothache occurs from inflammation of the central portion of the tooth called pulp. The pulp contains nerve endings that are very sensitive to pain. Inflammation to the pulp or pulpitis may be caused by dental cavities, trauma, and infection.    HOME CARE:   For toothaches: Over-the-counter pain medications such as acetaminophen or ibuprofen may be used. Take these as directed on the package while you arrange for a dental appointment. Avoid very cold or hot foods, because they may make the pain worse. You may get relief from biting on a cotton ball soaked in oil of cloves. You can get oil of cloves at most drug stores.  For jaw pain:  Aspirin may be helpful for problems in the joint of the jaw in adults. If pain happens every time you open your mouth widely, the temporomandibular joint (TMJ) may be the source of the pain. Yawning or taking a large bite of food may worsen the pain. An appointment with your doctor or dentist will help you find the cause.      GET HELP RIGHT AWAY IF:  You have a high fever or chills If you have had a recent head or face injury and develop headache, light headedness, nausea, vomiting, or other symptoms that concern you after an injury to your face or mouth, you could have a more serious injury in addition to your dental injury. A facial rash associated with a toothache: This condition may improve with medication. Contact your doctor for them to decide what is appropriate. Any jaw pain occurring with chest pain: Although jaw pain is most commonly caused by dental disease, it is sometimes referred pain from other areas. People with heart disease, especially people who have had stents placed, people with diabetes, or those who have had heart surgery may have jaw pain as a symptom of heart attack or angina. If your jaw or tooth pain is associated with lightheadedness, sweating, or shortness of breath, you should see a doctor as soon as possible. Trouble swallowing or excessive pain or bleeding from gums: If you have a history of a weakened immune system, diabetes, or steroid use, you may be more susceptible to infections. Infections can often be more severe and extensive or caused by unusual organisms. Dental and gum infections in people with these conditions may require more aggressive treatment. An abscess may need draining or IV antibiotics, for example.  MAKE SURE YOU   Understand  these instructions. Will watch your condition. Will get help right away if you are not doing well or get worse.  Thank you for choosing an e-visit.  Your e-visit answers were reviewed by a board certified advanced clinical practitioner to complete your personal care plan. Depending upon the condition, your plan could have included both over the counter or prescription medications.  Please review your pharmacy choice. Make sure the pharmacy is open so you can pick up prescription now. If there is a problem, you may contact your provider  through [employer] and have the prescription routed to another pharmacy.  Your safety is important to [place]. If you have drug allergies check your prescription carefully.   For the next 24 hours you can use MyChart to ask questions about today's visit, request a non-urgent call back, or ask for a work or school excuse. You will get an email in the next two days asking about your experience. I hope that your e-visit has been valuable and will speed your recovery.   I have spent 5 minutes in review of e-visit questionnaire, review and updating patient chart, medical decision making and response to patient.   Tylene Fantasia Ward, PA-C

## 2023-05-13 ENCOUNTER — Encounter (HOSPITAL_COMMUNITY): Payer: Self-pay | Admitting: Internal Medicine

## 2023-05-13 ENCOUNTER — Ambulatory Visit (HOSPITAL_COMMUNITY): Payer: BC Managed Care – PPO | Attending: Internal Medicine

## 2023-05-14 ENCOUNTER — Encounter: Payer: Self-pay | Admitting: Nurse Practitioner

## 2023-05-14 NOTE — Telephone Encounter (Signed)
Pt sent follow up message that issue has been resolved.

## 2023-05-17 ENCOUNTER — Telehealth: Payer: BC Managed Care – PPO | Admitting: Physician Assistant

## 2023-05-17 DIAGNOSIS — K0889 Other specified disorders of teeth and supporting structures: Secondary | ICD-10-CM

## 2023-05-17 DIAGNOSIS — H9201 Otalgia, right ear: Secondary | ICD-10-CM

## 2023-05-17 MED ORDER — NEOMYCIN-POLYMYXIN-HC 3.5-10000-1 OT SOLN: 3.0000 [drp] | Freq: Four times a day (QID) | OTIC | 0 refills | Status: DC

## 2023-05-17 MED ORDER — CLINDAMYCIN HCL 300 MG PO CAPS: 300.0000 mg | ORAL_CAPSULE | Freq: Three times a day (TID) | ORAL | 0 refills | Status: DC

## 2023-05-17 NOTE — Progress Notes (Signed)
E Visit for Ear Pain   We are sorry that you are not feeling well. Here is how we plan to help!  Based on what you have shared with me it looks like you have Otitis Externa.  Otitis externa is a redness or swelling, irritation, or infection of your outer ear canal. These symptoms usually occur within a few days of swimming. Your ear canal is a tube that goes from the opening of the ear to the eardrum.  When water stays in your ear canal, germs can grow.  This is a painful condition that often happens to children and swimmers of all ages.  It is not contagious and oral antibiotics are not required to treat uncomplicated swimmer's ear.  The usual symptoms include:    Itchiness inside the ear  Redness or a sense of swelling in the ear  Pain when the ear is tugged on when pressure is placed on the ear  Pus draining from the infected ear   Clindamycin 300mg  Take 1 capsule every 8 hours for 10 days for tooth infection.  I have prescribed: Neomycin 0.35%, polymyxin B 10,000 units/mL, and hydrocortisone 0,5% otic solution 4 drops in affected ears four times a day for 7 days  In certain cases, swimmer's ear may progress to a more serious bacterial infection of the middle or inner ear.  If you have a fever 102 and up and significantly worsening symptoms, this could indicate a more serious infection moving to the middle/inner and needs face to face evaluation in an office by a provider.  Your symptoms should improve over the next 3 days and should resolve in about 7 days.  Be sure to complete ALL of your prescription.  HOME CARE: Wash your hands frequently. If you are prescribed an ear drop, do not place the tip of the bottle on your ear or touch it with your fingers. You can take Acetaminophen 650 mg every 4-6 hours as needed for pain.  If pain is severe or moderate, you can apply a heating pad (set on low) or hot water bottle (wrapped in a towel) to outer ear for 20 minutes.  This will also increase  drainage. Avoid ear plugs Do not go swimming until the symptoms are gone Do not use Q-tips After showers, help the water run out by tilting your head to one side.   GET HELP RIGHT AWAY IF: Fever is over 102.2 degrees. You develop progressive ear pain or hearing loss. Ear symptoms persist longer than 3 days after treatment.  MAKE SURE YOU: Understand these instructions. Will watch your condition. Will get help right away if you are not doing well or get worse.  TO PREVENT SWIMMER'S EAR: Use a bathing cap or custom fitted swim molds to keep your ears dry. Towel off after swimming to dry your ears. Tilt your head or pull your earlobes to allow the water to escape your ear canal. If there is still water in your ears, consider using a hairdryer on the lowest setting.  Thank you for choosing an e-visit.  Your e-visit answers were reviewed by a board certified advanced clinical practitioner to complete your personal care plan. Depending upon the condition, your plan could have included both over the counter or prescription medications.  Please review your pharmacy choice. Make sure the pharmacy is open so you can pick up the prescription now. If there is a problem, you may contact your provider through MyChart messaging and have the prescription routed to another  pharmacy.  Your safety is important to [place]. If you have drug allergies check your prescription carefully.   For the next 24 hours you can use MyChart to ask questions about today's visit, request a non-urgent call back, or ask for a work or school excuse. You will get an email with a survey after your eVisit asking about your experience. We would appreciate your feedback. I hope that your e-visit has been valuable and will aid in your recovery.  I have spent 5 minutes in review of e-visit questionnaire, review and updating patient chart, medical decision making and response to patient.   Margaretann Loveless, PA-C

## 2023-05-24 DIAGNOSIS — Z419 Encounter for procedure for purposes other than remedying health state, unspecified: Secondary | ICD-10-CM | POA: Diagnosis not present

## 2023-06-24 ENCOUNTER — Ambulatory Visit (INDEPENDENT_AMBULATORY_CARE_PROVIDER_SITE_OTHER): Payer: BC Managed Care – PPO | Admitting: Nurse Practitioner

## 2023-06-24 ENCOUNTER — Other Ambulatory Visit (HOSPITAL_COMMUNITY)
Admission: RE | Admit: 2023-06-24 | Discharge: 2023-06-24 | Disposition: A | Discharge status: Home / Self Care | Payer: BC Managed Care – PPO | Source: Ambulatory Visit | Attending: Nurse Practitioner | Admitting: Nurse Practitioner

## 2023-06-24 ENCOUNTER — Encounter: Payer: Self-pay | Admitting: Nurse Practitioner

## 2023-06-24 VITALS — BP 112/74 | HR 69 | Temp 98.5°F | Resp 16 | Ht 68.0 in | Wt 223.2 lb

## 2023-06-24 DIAGNOSIS — B9689 Other specified bacterial agents as the cause of diseases classified elsewhere: Secondary | ICD-10-CM | POA: Diagnosis not present

## 2023-06-24 DIAGNOSIS — N760 Acute vaginitis: Secondary | ICD-10-CM | POA: Insufficient documentation

## 2023-06-24 DIAGNOSIS — E782 Mixed hyperlipidemia: Secondary | ICD-10-CM

## 2023-06-24 DIAGNOSIS — R7303 Prediabetes: Secondary | ICD-10-CM

## 2023-06-24 DIAGNOSIS — N898 Other specified noninflammatory disorders of vagina: Secondary | ICD-10-CM

## 2023-06-24 DIAGNOSIS — E559 Vitamin D deficiency, unspecified: Secondary | ICD-10-CM

## 2023-06-24 DIAGNOSIS — F411 Generalized anxiety disorder: Secondary | ICD-10-CM | POA: Diagnosis not present

## 2023-06-24 DIAGNOSIS — Z6833 Body mass index (BMI) 33.0-33.9, adult: Secondary | ICD-10-CM | POA: Diagnosis not present

## 2023-06-24 DIAGNOSIS — I10 Essential (primary) hypertension: Secondary | ICD-10-CM

## 2023-06-24 DIAGNOSIS — E6609 Other obesity due to excess calories: Secondary | ICD-10-CM

## 2023-06-24 DIAGNOSIS — B3731 Acute candidiasis of vulva and vagina: Secondary | ICD-10-CM

## 2023-06-24 DIAGNOSIS — T50905A Adverse effect of unspecified drugs, medicaments and biological substances, initial encounter: Secondary | ICD-10-CM

## 2023-06-24 DIAGNOSIS — Z419 Encounter for procedure for purposes other than remedying health state, unspecified: Secondary | ICD-10-CM | POA: Diagnosis not present

## 2023-06-24 MED ORDER — ZEPBOUND 7.5 MG/0.5ML ~~LOC~~ SOAJ: 7.5000 mg | SUBCUTANEOUS | 1 refills | Status: DC

## 2023-06-24 MED ORDER — VITAMIN D (ERGOCALCIFEROL) 1.25 MG (50000 UNIT) PO CAPS: 50000.0000 [IU] | ORAL_CAPSULE | ORAL | 0 refills | Status: DC

## 2023-06-24 NOTE — Assessment & Plan Note (Signed)
Repeat hgbA1c 

## 2023-06-24 NOTE — Assessment & Plan Note (Signed)
She opted to discontinue zoloft 41month ago. She reports stable mood without med.

## 2023-06-24 NOTE — Assessment & Plan Note (Addendum)
Lost 7lbs in last 3months Total weight loss in last 49lbs in last 12months Improved GERD with use of omeprazole. Persistent constipation: BM every 3-4days. Did not use miralax as recommended due to texture on med. Did not increased oral hydration. Wt Readings from Last 3 Encounters:  06/24/23 223 lb 3.2 oz (101.2 kg)  03/16/23 230 lb (104.3 kg)  03/15/23 227 lb 6.4 oz (103.1 kg)    BP Readings from Last 3 Encounters:  06/24/23 112/74  03/16/23 118/82  03/15/23 124/80    Advised to maintain high fiber diet, 64oz of water daily, try colace 100mg  BID OVER THE COUNTER. Increased zepbound to 7.5mg  weekly F/up in 2months

## 2023-06-24 NOTE — Patient Instructions (Addendum)
Maintain 64oz of water daily Start colace 100mg  BID for constipation Schedule fasting lab appt. Need to be fasting 8hrs prior to blood draw. Ok to drink water and take BP meds.

## 2023-06-24 NOTE — Assessment & Plan Note (Signed)
Repeat lipid panel Maintain pravastatin dose 

## 2023-06-24 NOTE — Progress Notes (Signed)
Established Patient Visit  Patient: Jessica Greene   DOB: 11-30-1985   37 y.o. Female  MRN: [id number] Visit Date: 06/24/2023  Subjective:    Chief Complaint  Patient presents with   Weight Check   std check     Pt c/o clear discharge and itching.    Vaginal Discharge The patient's primary symptoms include genital itching and vaginal discharge. The patient's pertinent negatives include no genital lesions, genital odor, genital rash, missed menses, pelvic pain or vaginal bleeding. This is a recurrent problem. The current episode started in the past 7 days. The problem occurs intermittently. The problem has been waxing and waning. The patient is experiencing no pain. The problem affects both sides. She is not pregnant. Associated symptoms include constipation. Pertinent negatives include no back pain, diarrhea, discolored urine, flank pain, frequency, hematuria, joint pain, nausea, painful intercourse or rash. The vaginal discharge was thick. The vaginal bleeding is typical of menses. She has not been passing clots. She has not been passing tissue. Nothing aggravates the symptoms. She has tried nothing for the symptoms. She is sexually active. It is unknown whether or not her partner has an STD. She uses nothing for contraception. Her menstrual history has been regular. Her past medical history is significant for an STD and vaginosis.   Obesity Lost 7lbs in last 3months Total weight loss in last 49lbs in last 12months Improved GERD with use of omeprazole. Persistent constipation: BM every 3-4days. Did not use miralax as recommended due to texture on med. Did not increased oral hydration. Wt Readings from Last 3 Encounters:  06/24/23 223 lb 3.2 oz (101.2 kg)  03/16/23 230 lb (104.3 kg)  03/15/23 227 lb 6.4 oz (103.1 kg)    BP Readings from Last 3 Encounters:  06/24/23 112/74  03/16/23 118/82  03/15/23 124/80    Advised to maintain high fiber diet, 64oz of water daily,  try colace 100mg  BID OVER THE COUNTER. Increased zepbound to 7.5mg  weekly F/up in 2months   Mixed hyperlipidemia Repeat lipid panel Maintain pravastatin dose  GAD (generalized anxiety disorder) She opted to discontinue zoloft 6month ago. She reports stable mood without med.  Prediabetes Repeat hgbA1c   Reviewed medical, surgical, and social history today  Medications: Outpatient Medications Prior to Visit  Medication Sig   clindamycin (CLEOCIN) 300 MG capsule Take 1 capsule (300 mg total) by mouth 3 (three) times daily.   hydrochlorothiazide (MICROZIDE) 12.5 MG capsule Take 1 capsule (12.5 mg total) by mouth daily.   ibuprofen (ADVIL) 600 MG tablet Take 1 tablet (600 mg total) by mouth every 8 (eight) hours as needed.   omeprazole (PRILOSEC) 20 MG capsule Take 1 capsule (20 mg total) by mouth in the morning.   ondansetron (ZOFRAN) 4 MG tablet Take 1 tablet (4 mg total) by mouth every 8 (eight) hours as needed for nausea or vomiting.   pravastatin (PRAVACHOL) 20 MG tablet Take 1 tablet (20 mg total) by mouth at bedtime.   tiZANidine (ZANAFLEX) 4 MG tablet Take 1 tablet (4 mg) by mouth at bedtime as needed for muscle spasms.   [DISCONTINUED] neomycin-polymyxin-hydrocortisone (CORTISPORIN) OTIC solution Place 3 drops into the right ear 4 (four) times daily. For 7 days   [DISCONTINUED] sertraline (ZOLOFT) 25 MG tablet Take 1 tablet (25 mg total) by mouth daily.   [DISCONTINUED] tirzepatide (ZEPBOUND) 5 MG/0.5ML Pen Inject 5 mg into the skin once a week.   [DISCONTINUED]  Vitamin D, Ergocalciferol, (DRISDOL) 1.25 MG (50000 UNIT) CAPS capsule Take 1 capsule (50,000 Units total) by mouth every 7 (seven) days.   potassium chloride SA (KLOR-CON M) 20 MEQ tablet Take 1 tablet by mouth twice daily for 5 days, then 1 tablet daily continuously   No facility-administered medications prior to visit.   Reviewed past medical and social history.   ROS per HPI above      Objective:  BP 112/74  (BP Location: Left Arm, Patient Position: Sitting, Cuff Size: Large)   Pulse 69   Temp 98.5 F (36.9 C) (Temporal)   Resp 16   Ht 5\' 8"  (1.727 m)   Wt 223 lb 3.2 oz (101.2 kg)   SpO2 96%   BMI 33.94 kg/m      Physical Exam Cardiovascular:     Rate and Rhythm: Normal rate and regular rhythm.     Pulses: Normal pulses.     Heart sounds: Normal heart sounds.  Pulmonary:     Effort: Pulmonary effort is normal.     Breath sounds: Normal breath sounds.  Genitourinary:    Comments: Opted to self swab Neurological:     Mental Status: She is alert and oriented to person, place, and time.     No results found for any visits on 06/24/23.    Assessment & Plan:    Problem List Items Addressed This Visit       Cardiovascular and Mediastinum   HTN (hypertension)   Relevant Orders   Basic metabolic panel     Other   GAD (generalized anxiety disorder)    She opted to discontinue zoloft 33month ago. She reports stable mood without med.      Mixed hyperlipidemia    Repeat lipid panel Maintain pravastatin dose      Relevant Orders   Lipid panel   Obesity - Primary    Lost 7lbs in last 3months Total weight loss in last 49lbs in last 12months Improved GERD with use of omeprazole. Persistent constipation: BM every 3-4days. Did not use miralax as recommended due to texture on med. Did not increased oral hydration. Wt Readings from Last 3 Encounters:  06/24/23 223 lb 3.2 oz (101.2 kg)  03/16/23 230 lb (104.3 kg)  03/15/23 227 lb 6.4 oz (103.1 kg)    BP Readings from Last 3 Encounters:  06/24/23 112/74  03/16/23 118/82  03/15/23 124/80    Advised to maintain high fiber diet, 64oz of water daily, try colace 100mg  BID OVER THE COUNTER. Increased zepbound to 7.5mg  weekly F/up in 2months       Relevant Medications   tirzepatide (ZEPBOUND) 7.5 MG/0.5ML Pen   Prediabetes    Repeat hgbA1c      Relevant Orders   Hemoglobin A1c   Other Visit Diagnoses     Vitamin D  deficiency       Relevant Medications   Vitamin D, Ergocalciferol, (DRISDOL) 1.25 MG (50000 UNIT) CAPS capsule   Other Relevant Orders   VITAMIN D 25 Hydroxy (Vit-D Deficiency, Fractures)   Vaginal discharge       Relevant Orders   Cervicovaginal ancillary only( )   Drug-induced hypokalemia       Relevant Orders   Basic metabolic panel      Return in about 2 months (around 08/24/2023) for Weight management.     Alysia Penna, NP

## 2023-06-25 MED ORDER — FLUCONAZOLE 150 MG PO TABS: 150.0000 mg | ORAL_TABLET | Freq: Every day | ORAL | 0 refills | Status: DC

## 2023-06-25 MED ORDER — METRONIDAZOLE 0.75 % VA GEL: 1.0000 | Freq: Every day | VAGINAL | 0 refills | Status: DC

## 2023-06-25 NOTE — Addendum Note (Signed)
Addended by: Michaela Corner on: 06/25/2023 03:35 PM   Modules accepted: Orders

## 2023-06-28 ENCOUNTER — Other Ambulatory Visit: Payer: BC Managed Care – PPO

## 2023-06-29 ENCOUNTER — Other Ambulatory Visit: Payer: Self-pay | Admitting: Nurse Practitioner

## 2023-06-29 ENCOUNTER — Other Ambulatory Visit (INDEPENDENT_AMBULATORY_CARE_PROVIDER_SITE_OTHER): Payer: BC Managed Care – PPO

## 2023-06-29 DIAGNOSIS — E876 Hypokalemia: Secondary | ICD-10-CM

## 2023-06-29 DIAGNOSIS — T50905A Adverse effect of unspecified drugs, medicaments and biological substances, initial encounter: Secondary | ICD-10-CM

## 2023-06-29 DIAGNOSIS — E782 Mixed hyperlipidemia: Secondary | ICD-10-CM

## 2023-06-29 DIAGNOSIS — E559 Vitamin D deficiency, unspecified: Secondary | ICD-10-CM

## 2023-06-29 DIAGNOSIS — R7303 Prediabetes: Secondary | ICD-10-CM | POA: Diagnosis not present

## 2023-06-29 DIAGNOSIS — I10 Essential (primary) hypertension: Secondary | ICD-10-CM | POA: Diagnosis not present

## 2023-06-29 LAB — LIPID PANEL
Cholesterol: 169 mg/dL (ref 0–200)
HDL: 34.6 mg/dL — ABNORMAL LOW (ref >39.00)
LDL Cholesterol: 113 mg/dL — ABNORMAL HIGH (ref 0–99)
NonHDL: 134.43
Total CHOL/HDL Ratio: 5
Triglycerides: 107 mg/dL (ref 0.0–149.0)
VLDL: 21.4 mg/dL (ref 0.0–40.0)

## 2023-06-29 LAB — BASIC METABOLIC PANEL
BUN: 7 mg/dL (ref 6–23)
CO2: 28 mEq/L (ref 19–32)
Calcium: 9 mg/dL (ref 8.4–10.5)
Chloride: 102 mEq/L (ref 96–112)
Creatinine, Ser: 0.62 mg/dL (ref 0.40–1.20)
GFR: 113.23 mL/min (ref >60.00)
Glucose, Bld: 82 mg/dL (ref 70–99)
Potassium: 2.9 mEq/L — ABNORMAL LOW (ref 3.5–5.1)
Sodium: 139 mEq/L (ref 135–145)

## 2023-06-29 LAB — HEMOGLOBIN A1C: Hgb A1c MFr Bld: 5.8 % (ref 4.6–6.5)

## 2023-06-29 LAB — VITAMIN D 25 HYDROXY (VIT D DEFICIENCY, FRACTURES): VITD: 11.68 ng/mL — ABNORMAL LOW (ref 30.00–100.00)

## 2023-06-29 MED ORDER — TRIAMTERENE-HCTZ 37.5-25 MG PO TABS: 0.5000 | ORAL_TABLET | Freq: Every day | ORAL | 1 refills | Status: DC

## 2023-06-29 MED ORDER — POTASSIUM CHLORIDE CRYS ER 20 MEQ PO TBCR: 20.0000 meq | EXTENDED_RELEASE_TABLET | Freq: Two times a day (BID) | ORAL | 0 refills | Status: DC

## 2023-06-30 ENCOUNTER — Telehealth: Payer: Self-pay | Admitting: Pharmacy Technician

## 2023-06-30 ENCOUNTER — Other Ambulatory Visit (HOSPITAL_COMMUNITY): Payer: Self-pay

## 2023-06-30 NOTE — Telephone Encounter (Signed)
Pharmacy Patient Advocate Encounter   Received notification from CoverMyMeds that prior authorization for zepbound 7.5mg /0.72ml is required/requested.   Insurance verification completed.   The patient is insured through  [employer]   .   Per test claim: PA required; PA submitted to [facility] via CoverMyMeds Key/confirmation #/EOC [id number] Status is pending

## 2023-07-02 ENCOUNTER — Encounter: Payer: Self-pay | Admitting: Nurse Practitioner

## 2023-07-05 ENCOUNTER — Other Ambulatory Visit (HOSPITAL_COMMUNITY): Payer: Self-pay

## 2023-07-05 NOTE — Telephone Encounter (Signed)
Pharmacy Patient Advocate Encounter  Received notification from  [employer]  that Prior Authorization for Zepbound 7.5MG /0.5ML pen-injectors has been APPROVED from 06/30/2023 to 06/27/2024. Ran test claim, Copay is $24.99. This test claim was processed through [facility]- copay amounts may vary at other pharmacies due to pharmacy/plan contracts, or as the patient moves through the different stages of their insurance plan.   PA #/Case ID/Reference #: [id number]

## 2023-07-25 DIAGNOSIS — Z419 Encounter for procedure for purposes other than remedying health state, unspecified: Secondary | ICD-10-CM | POA: Diagnosis not present

## 2023-08-01 ENCOUNTER — Telehealth: Payer: BC Managed Care – PPO | Admitting: Physician Assistant

## 2023-08-01 DIAGNOSIS — J069 Acute upper respiratory infection, unspecified: Secondary | ICD-10-CM | POA: Diagnosis not present

## 2023-08-01 MED ORDER — ALBUTEROL SULFATE HFA 108 (90 BASE) MCG/ACT IN AERS: 2.0000 | INHALATION_SPRAY | Freq: Four times a day (QID) | RESPIRATORY_TRACT | 0 refills | Status: DC | PRN

## 2023-08-01 MED ORDER — FLUTICASONE PROPIONATE 50 MCG/ACT NA SUSP: 2.0000 | Freq: Every day | NASAL | 6 refills | Status: DC

## 2023-08-01 MED ORDER — BENZONATATE 100 MG PO CAPS: 100.0000 mg | ORAL_CAPSULE | Freq: Three times a day (TID) | ORAL | 0 refills | Status: DC | PRN

## 2023-08-01 NOTE — Progress Notes (Signed)
E-Visit for Upper Respiratory Infection   We are sorry you are not feeling well.  Here is how we plan to help!  Based on what you have shared with me, it looks like you may have a viral upper respiratory infection.  Upper respiratory infections are caused by a large number of viruses; however, rhinovirus is the most common cause.   I recommend you take a home COVID test.  If you are having chest tightness please go to Urgent Care or the Emergency Department for evaluation.   Symptoms vary from person to person, with common symptoms including sore throat, cough, fatigue or lack of energy and feeling of general discomfort.  A low-grade fever of up to 100.4 may present, but is often uncommon.  Symptoms vary however, and are closely related to a person's age or underlying illnesses.  The most common symptoms associated with an upper respiratory infection are nasal discharge or congestion, cough, sneezing, headache and pressure in the ears and face.  These symptoms usually persist for about 3 to 10 days, but can last up to 2 weeks.  It is important to know that upper respiratory infections do not cause serious illness or complications in most cases.    Upper respiratory infections can be transmitted from person to person, with the most common method of transmission being a person's hands.  The virus is able to live on the skin and can infect other persons for up to 2 hours after direct contact.  Also, these can be transmitted when someone coughs or sneezes; thus, it is important to cover the mouth to reduce this risk.  To keep the spread of the illness at bay, good hand hygiene is very important.  This is an infection that is most likely caused by a virus. There are no specific treatments other than to help you with the symptoms until the infection runs its course.  We are sorry you are not feeling well.  Here is how we plan to help!   For nasal congestion, you may use an oral decongestants such as Mucinex  D or if you have glaucoma or high blood pressure use plain Mucinex.  Saline nasal spray or nasal drops can help and can safely be used as often as needed for congestion.  For your congestion, I have prescribed Fluticasone nasal spray one spray in each nostril twice a day  If you do not have a history of heart disease, hypertension, diabetes or thyroid disease, prostate/bladder issues or glaucoma, you may also use Sudafed to treat nasal congestion.  It is highly recommended that you consult with a pharmacist or your primary care physician to ensure this medication is safe for you to take.     If you have a cough, you may use cough suppressants such as Delsym and Robitussin.  If you have glaucoma or high blood pressure, you can also use Coricidin HBP.   For cough I have prescribed for you A prescription cough medication called Tessalon Perles 100 mg. You may take 1-2 capsules every 8 hours as needed for cough  I have also prescribed an inhaler to use as needed.   If you have a sore or scratchy throat, use a saltwater gargle-  to  teaspoon of salt dissolved in a 4-ounce to 8-ounce glass of warm water.  Gargle the solution for approximately 15-30 seconds and then spit.  It is important not to swallow the solution.  You can also use throat lozenges/cough drops and Chloraseptic spray  to help with throat pain or discomfort.  Warm or cold liquids can also be helpful in relieving throat pain.  For headache, pain or general discomfort, you can use Ibuprofen or Tylenol as directed.   Some authorities believe that zinc sprays or the use of Echinacea may shorten the course of your symptoms.   HOME CARE Only take medications as instructed by your medical team. Be sure to drink plenty of fluids. Water is fine as well as fruit juices, sodas and electrolyte beverages. You may want to stay away from caffeine or alcohol. If you are nauseated, try taking small sips of liquids. How do you know if you are getting  enough fluid? Your urine should be a pale yellow or almost colorless. Get rest. Taking a steamy shower or using a humidifier may help nasal congestion and ease sore throat pain. You can place a towel over your head and breathe in the steam from hot water coming from a faucet. Using a saline nasal spray works much the same way. Cough drops, hard candies and sore throat lozenges may ease your cough. Avoid close contacts especially the very young and the elderly Cover your mouth if you cough or sneeze Always remember to wash your hands.   GET HELP RIGHT AWAY IF: You develop worsening fever. If your symptoms do not improve within 10 days You develop yellow or green discharge from your nose over 3 days. You have coughing fits You develop a severe head ache or visual changes. You develop shortness of breath, difficulty breathing or start having chest pain Your symptoms persist after you have completed your treatment plan  MAKE SURE YOU  Understand these instructions. Will watch your condition. Will get help right away if you are not doing well or get worse.  Thank you for choosing an e-visit.  Your e-visit answers were reviewed by a board certified advanced clinical practitioner to complete your personal care plan. Depending upon the condition, your plan could have included both over the counter or prescription medications.  Please review your pharmacy choice. Make sure the pharmacy is open so you can pick up prescription now. If there is a problem, you may contact your provider through [employer] and have the prescription routed to another pharmacy.  Your safety is important to [place]. If you have drug allergies check your prescription carefully.   For the next 24 hours you can use MyChart to ask questions about today's visit, request a non-urgent call back, or ask for a work or school excuse. You will get an email in the next two days asking about your experience. I hope that your  e-visit has been valuable and will speed your recovery.    I have spent 5 minutes in review of e-visit questionnaire, review and updating patient chart, medical decision making and response to patient.   Tylene Fantasia Ward, PA-C

## 2023-08-16 ENCOUNTER — Other Ambulatory Visit: Payer: Self-pay | Admitting: Nurse Practitioner

## 2023-08-16 DIAGNOSIS — E782 Mixed hyperlipidemia: Secondary | ICD-10-CM

## 2023-08-24 ENCOUNTER — Ambulatory Visit (INDEPENDENT_AMBULATORY_CARE_PROVIDER_SITE_OTHER): Payer: BC Managed Care – PPO | Admitting: Nurse Practitioner

## 2023-08-24 ENCOUNTER — Encounter: Payer: Self-pay | Admitting: Nurse Practitioner

## 2023-08-24 ENCOUNTER — Other Ambulatory Visit (HOSPITAL_COMMUNITY)
Admission: RE | Admit: 2023-08-24 | Discharge: 2023-08-24 | Disposition: A | Discharge status: Home / Self Care | Payer: BC Managed Care – PPO | Source: Ambulatory Visit | Attending: Nurse Practitioner | Admitting: Nurse Practitioner

## 2023-08-24 VITALS — BP 120/70 | HR 90 | Temp 97.9°F | Ht 68.0 in | Wt 216.0 lb

## 2023-08-24 DIAGNOSIS — Z6832 Body mass index (BMI) 32.0-32.9, adult: Secondary | ICD-10-CM | POA: Diagnosis not present

## 2023-08-24 DIAGNOSIS — N921 Excessive and frequent menstruation with irregular cycle: Secondary | ICD-10-CM | POA: Diagnosis not present

## 2023-08-24 DIAGNOSIS — A5901 Trichomonal vulvovaginitis: Secondary | ICD-10-CM

## 2023-08-24 DIAGNOSIS — N939 Abnormal uterine and vaginal bleeding, unspecified: Secondary | ICD-10-CM | POA: Insufficient documentation

## 2023-08-24 DIAGNOSIS — E66811 Obesity, class 1: Secondary | ICD-10-CM

## 2023-08-24 DIAGNOSIS — N898 Other specified noninflammatory disorders of vagina: Secondary | ICD-10-CM

## 2023-08-24 DIAGNOSIS — E876 Hypokalemia: Secondary | ICD-10-CM | POA: Diagnosis not present

## 2023-08-24 DIAGNOSIS — E6609 Other obesity due to excess calories: Secondary | ICD-10-CM

## 2023-08-24 DIAGNOSIS — Z419 Encounter for procedure for purposes other than remedying health state, unspecified: Secondary | ICD-10-CM | POA: Diagnosis not present

## 2023-08-24 DIAGNOSIS — K219 Gastro-esophageal reflux disease without esophagitis: Secondary | ICD-10-CM

## 2023-08-24 DIAGNOSIS — Z1159 Encounter for screening for other viral diseases: Secondary | ICD-10-CM

## 2023-08-24 LAB — BASIC METABOLIC PANEL
BUN: 7 mg/dL (ref 6–23)
CO2: 29 meq/L (ref 19–32)
Calcium: 9 mg/dL (ref 8.4–10.5)
Chloride: 104 meq/L (ref 96–112)
Creatinine, Ser: 0.64 mg/dL (ref 0.40–1.20)
GFR: 112.25 mL/min (ref >60.00)
Glucose, Bld: 89 mg/dL (ref 70–99)
Potassium: 3.5 meq/L (ref 3.5–5.1)
Sodium: 140 meq/L (ref 135–145)

## 2023-08-24 LAB — CBC
HCT: 36.9 % (ref 36.0–46.0)
Hemoglobin: 12 g/dL (ref 12.0–15.0)
MCHC: 32.4 g/dL (ref 30.0–36.0)
MCV: 91.9 fL (ref 78.0–100.0)
Platelets: 228 10*3/uL (ref 150.0–400.0)
RBC: 4.02 Mil/uL (ref 3.87–5.11)
RDW: 13.9 % (ref 11.5–15.5)
WBC: 5.7 10*3/uL (ref 4.0–10.5)

## 2023-08-24 MED ORDER — METRONIDAZOLE 0.75 % VA GEL
1.0000 | Freq: Every day | VAGINAL | 0 refills | Status: DC
Start: 2023-08-24 — End: 2023-08-26

## 2023-08-24 MED ORDER — ONDANSETRON HCL 4 MG PO TABS
4.0000 mg | ORAL_TABLET | Freq: Three times a day (TID) | ORAL | 0 refills | Status: DC | PRN
Start: 2023-08-24 — End: 2023-11-12

## 2023-08-24 NOTE — Assessment & Plan Note (Signed)
Reports heavy bleeding x 2weeks with large clots. Associated with fatigue. Hx of uterine polyps and PCOS Last pelvic US 2019: Uterus-measurements: 9.2 x 5.4 x 5.4 cm. Retroverted. No focal mass lesion. Endometrium-Thickness: 19 mm thick.  No endometrial fluid or focal abnormality.  Right ovary-Measurements: 4.1 x 8.3 x 4.5 cm. Numerous small follicles question polycystic ovary. Dominant cyst 2.3 x 1.1 x 2.3 cm, complicated by internal septations/cysts. Blood flow present within right ovary on color Doppler imaging. Left ovary-Measurements: 4.5 x 2.6 x 4.5 cm. Numerous peripheral follicles within left ovary question polycystic ovary. Blood flow present within left ovary on color Doppler imaging. No free pelvic fluid or additional adnexal masses.  Check urine pregnancy, CBC and iron panel Advised to schedule appointment with GYN

## 2023-08-24 NOTE — Progress Notes (Unsigned)
Established Patient Visit  Patient: Sonam Wandel   DOB: 09-Feb-1986   37 y.o. Female  MRN: [id number] Visit Date: 08/24/2023  Subjective:    Chief Complaint  Patient presents with   Follow-up    2 month follow up and std check. She does have vaginal odor.   Vaginal Discharge The patient's primary symptoms include a genital odor, vaginal bleeding and vaginal discharge. The patient's pertinent negatives include no genital itching, genital lesions, genital rash, missed menses or pelvic pain. This is a recurrent problem. The current episode started in the past 7 days. The problem occurs constantly. The problem has been waxing and waning. The patient is experiencing no pain. She is not pregnant. Pertinent negatives include no abdominal pain, anorexia, back pain, chills, constipation, diarrhea, discolored urine, dysuria, fever, flank pain, frequency, headaches, hematuria, joint pain, joint swelling, nausea, painful intercourse, rash, sore throat, urgency or vomiting. The vaginal discharge was bloody. The vaginal bleeding is heavier than menses. She has been passing clots. She has not been passing tissue. Nothing aggravates the symptoms. She has tried nothing for the symptoms. She is sexually active. It is unknown whether or not her partner has an STD. She uses nothing for contraception. Her menstrual history has been regular. Her past medical history is significant for menorrhagia, ovarian cysts and vaginosis. There is no history of an abdominal surgery, endometriosis, PID, an STD or a terminated pregnancy.   Obesity Lost 7lbs in last 59month Total weight loss in last 14months: 56lbs Report mild nausea with 7.5mg  of zepbound Diet: has maintained low fat, high protein, low carb diet Exercise: cardio and strength training 30-47mins daily Wt Readings from Last 3 Encounters:  08/24/23 216 lb (98 kg)  06/24/23 223 lb 3.2 oz (101.2 kg)  03/16/23 230 lb (104.3 kg)    Maintain med  dose, refill sent Sent zofran for nausea prn F/up in 2months  Menorrhagia with irregular cycle Reports heavy bleeding x 2weeks with large clots. Associated with fatigue. Hx of uterine polyps and PCOS Last pelvic US 2019: Uterus-measurements: 9.2 x 5.4 x 5.4 cm. Retroverted. No focal mass lesion. Endometrium-Thickness: 19 mm thick.  No endometrial fluid or focal abnormality.  Right ovary-Measurements: 4.1 x 8.3 x 4.5 cm. Numerous small follicles question polycystic ovary. Dominant cyst 2.3 x 1.1 x 2.3 cm, complicated by internal septations/cysts. Blood flow present within right ovary on color Doppler imaging. Left ovary-Measurements: 4.5 x 2.6 x 4.5 cm. Numerous peripheral follicles within left ovary question polycystic ovary. Blood flow present within left ovary on color Doppler imaging. No free pelvic fluid or additional adnexal masses.  Check urine pregnancy, CBC and iron panel Advised to schedule appointment with GYN  Reviewed medical, surgical, and social history today  Medications: Outpatient Medications Prior to Visit  Medication Sig   albuterol (VENTOLIN HFA) 108 (90 Base) MCG/ACT inhaler Inhale 2 puffs into the lungs every 6 (six) hours as needed for wheezing or shortness of breath.   ibuprofen (ADVIL) 600 MG tablet Take 1 tablet (600 mg total) by mouth every 8 (eight) hours as needed.   omeprazole (PRILOSEC) 20 MG capsule Take 1 capsule (20 mg total) by mouth in the morning.   potassium chloride SA (KLOR-CON M) 20 MEQ tablet Take 1 tablet (20 mEq total) by mouth 2 (two) times daily.   pravastatin (PRAVACHOL) 20 MG tablet TAKE 1 TABLET BY MOUTH AT BEDTIME (DISCONTINUE  FENOFIBRATE)   tirzepatide (  ZEPBOUND) 7.5 MG/0.5ML Pen Inject 7.5 mg into the skin once a week.   triamterene-hydrochlorothiazide (MAXZIDE-25) 37.5-25 MG tablet Take 0.5 tablets by mouth daily.   Vitamin D, Ergocalciferol, (DRISDOL) 1.25 MG (50000 UNIT) CAPS capsule Take 1 capsule (50,000 Units total) by mouth every 7  (seven) days.   [DISCONTINUED] benzonatate (TESSALON) 100 MG capsule Take 1 capsule (100 mg total) by mouth 3 (three) times daily as needed. (Patient not taking: Reported on 08/24/2023)   [DISCONTINUED] fluconazole (DIFLUCAN) 150 MG tablet Take 1 tablet (150 mg total) by mouth daily. Take second tab 3days apart from first tab (Patient not taking: Reported on 08/24/2023)   [DISCONTINUED] fluticasone (FLONASE) 50 MCG/ACT nasal spray Place 2 sprays into both nostrils daily. (Patient not taking: Reported on 08/24/2023)   [DISCONTINUED] metroNIDAZOLE (METROGEL) 0.75 % vaginal gel Place 1 Applicatorful vaginally at bedtime. (Patient not taking: Reported on 08/24/2023)   [DISCONTINUED] ondansetron (ZOFRAN) 4 MG tablet Take 1 tablet (4 mg total) by mouth every 8 (eight) hours as needed for nausea or vomiting. (Patient not taking: Reported on 08/24/2023)   [DISCONTINUED] tiZANidine (ZANAFLEX) 4 MG tablet Take 1 tablet (4 mg) by mouth at bedtime as needed for muscle spasms. (Patient not taking: Reported on 08/24/2023)   No facility-administered medications prior to visit.   Reviewed past medical and social history.   ROS per HPI above  {Show previous labs (optional):23779}    Objective:  BP 120/70   Pulse 90   Temp 97.9 F (36.6 C) (Temporal)   Ht 5\' 8"  (1.727 m)   Wt 216 lb (98 kg)   SpO2 97%   BMI 32.84 kg/m      Physical Exam Cardiovascular:     Rate and Rhythm: Normal rate and regular rhythm.     Pulses: Normal pulses.     Heart sounds: Normal heart sounds.  Pulmonary:     Effort: Pulmonary effort is normal.     Breath sounds: Normal breath sounds.  Genitourinary:    Comments: She opted to self swab Neurological:     Mental Status: She is alert and oriented to person, place, and time.     No results found for any visits on 08/24/23.    Assessment & Plan:    Problem List Items Addressed This Visit     GERD (gastroesophageal reflux disease)   Relevant Medications   ondansetron  (ZOFRAN) 4 MG tablet   Menorrhagia with irregular cycle    Reports heavy bleeding x 2weeks with large clots. Associated with fatigue. Hx of uterine polyps and PCOS Last pelvic US 2019: Uterus-measurements: 9.2 x 5.4 x 5.4 cm. Retroverted. No focal mass lesion. Endometrium-Thickness: 19 mm thick.  No endometrial fluid or focal abnormality.  Right ovary-Measurements: 4.1 x 8.3 x 4.5 cm. Numerous small follicles question polycystic ovary. Dominant cyst 2.3 x 1.1 x 2.3 cm, complicated by internal septations/cysts. Blood flow present within right ovary on color Doppler imaging. Left ovary-Measurements: 4.5 x 2.6 x 4.5 cm. Numerous peripheral follicles within left ovary question polycystic ovary. Blood flow present within left ovary on color Doppler imaging. No free pelvic fluid or additional adnexal masses.  Check urine pregnancy, CBC and iron panel Advised to schedule appointment with GYN      Relevant Orders   POCT urine pregnancy   CBC   Iron, TIBC and Ferritin Panel   Obesity    Lost 7lbs in last 74month Total weight loss in last 14months: 56lbs Report mild nausea with 7.5mg  of zepbound Diet:  has maintained low fat, high protein, low carb diet Exercise: cardio and strength training 30-5mins daily Wt Readings from Last 3 Encounters:  08/24/23 216 lb (98 kg)  06/24/23 223 lb 3.2 oz (101.2 kg)  03/16/23 230 lb (104.3 kg)    Maintain med dose, refill sent Sent zofran for nausea prn F/up in 2months      Other Visit Diagnoses     Vaginal odor    -  Primary   Relevant Medications   metroNIDAZOLE (METROGEL) 0.75 % vaginal gel   Other Relevant Orders   Cervicovaginal ancillary only( [place])   Encounter for screening for viral disease       Relevant Orders   HIV Antibody (routine testing w rflx)   RPR   Hepatitis C antibody   Hep B Core Ab W/Reflex   Hypokalemia       Relevant Orders   Basic metabolic panel      Return in about 2 months (around 10/24/2023) for Weight  management, hyperlipidemia (fasting).     Alysia Penna, NP

## 2023-08-24 NOTE — Assessment & Plan Note (Signed)
Lost 7lbs in last 27month Total weight loss in last 14months: 56lbs Report mild nausea with 7.5mg  of zepbound Diet: has maintained low fat, high protein, low carb diet Exercise: cardio and strength training 30-76mins daily Wt Readings from Last 3 Encounters:  08/24/23 216 lb (98 kg)  06/24/23 223 lb 3.2 oz (101.2 kg)  03/16/23 230 lb (104.3 kg)    Maintain med dose, refill sent Sent zofran for nausea prn F/up in 2months

## 2023-08-24 NOTE — Assessment & Plan Note (Signed)
>>  ASSESSMENT AND PLAN FOR MENORRHAGIA WITH IRREGULAR CYCLE WRITTEN ON 08/24/2023  3:44 PM BY Roshni Burbano LUM, NP  Reports heavy bleeding x 2weeks with large clots. Associated with fatigue. Hx of uterine polyps and PCOS Last pelvic US  2019: Uterus-measurements: 9.2 x 5.4 x 5.4 cm. Retroverted. No focal mass lesion. Endometrium-Thickness: 19 mm thick.  No endometrial fluid or focal abnormality.  Right ovary-Measurements: 4.1 x 8.3 x 4.5 cm. Numerous small follicles question polycystic ovary. Dominant cyst 2.3 x 1.1 x 2.3 cm, complicated by internal septations/cysts. Blood flow present within right ovary on color Doppler imaging. Left ovary-Measurements: 4.5 x 2.6 x 4.5 cm. Numerous peripheral follicles within left ovary question polycystic ovary. Blood flow present within left ovary on color Doppler imaging. No free pelvic fluid or additional adnexal masses.  Check urine pregnancy, CBC and iron  panel Advised to schedule appointment with GYN

## 2023-08-24 NOTE — Patient Instructions (Addendum)
Go to lab Continue Heart healthy diet and daily exercise. Maintain current medications.  Ok to use curturelle probiotics 1cap daily and boric acid 1cap 1-2x/day as needed for recurrent bacterial vaginosis.

## 2023-08-25 LAB — CERVICOVAGINAL ANCILLARY ONLY
Bacterial Vaginitis (gardnerella): NEGATIVE
Candida Glabrata: NEGATIVE
Candida Vaginitis: NEGATIVE
Chlamydia: NEGATIVE
Comment: NEGATIVE
Comment: NEGATIVE
Comment: NEGATIVE
Comment: NEGATIVE
Comment: NEGATIVE
Comment: NORMAL
Neisseria Gonorrhea: NEGATIVE
Trichomonas: POSITIVE — AB

## 2023-08-25 LAB — RPR: RPR Ser Ql: NONREACTIVE

## 2023-08-25 LAB — IRON,TIBC AND FERRITIN PANEL
%SAT: 17 % (ref 16–45)
Ferritin: 52 ng/mL (ref 16–154)
Iron: 58 ug/dL (ref 40–190)
TIBC: 333 ug/dL (ref 250–450)

## 2023-08-25 LAB — HIV ANTIBODY (ROUTINE TESTING W REFLEX): HIV 1&2 Ab, 4th Generation: NONREACTIVE

## 2023-08-25 LAB — HEPATITIS B CORE AB W/REFLEX: Hep B Core Total Ab: NEGATIVE

## 2023-08-25 LAB — HEPATITIS C ANTIBODY: Hepatitis C Ab: NONREACTIVE

## 2023-08-26 ENCOUNTER — Other Ambulatory Visit: Payer: BC Managed Care – PPO

## 2023-08-26 MED ORDER — METRONIDAZOLE 500 MG PO TABS
2000.0000 mg | ORAL_TABLET | Freq: Once | ORAL | 0 refills | Status: AC
Start: 2023-08-26 — End: 2023-08-26

## 2023-09-03 ENCOUNTER — Other Ambulatory Visit (INDEPENDENT_AMBULATORY_CARE_PROVIDER_SITE_OTHER): Payer: BC Managed Care – PPO

## 2023-09-03 ENCOUNTER — Other Ambulatory Visit (HOSPITAL_COMMUNITY)
Admission: RE | Admit: 2023-09-03 | Discharge: 2023-09-03 | Disposition: A | Discharge status: Home / Self Care | Payer: BC Managed Care – PPO | Source: Ambulatory Visit | Attending: Nurse Practitioner | Admitting: Nurse Practitioner

## 2023-09-03 DIAGNOSIS — A5901 Trichomonal vulvovaginitis: Secondary | ICD-10-CM

## 2023-09-03 DIAGNOSIS — N921 Excessive and frequent menstruation with irregular cycle: Secondary | ICD-10-CM

## 2023-09-03 LAB — POCT URINE PREGNANCY: Preg Test, Ur: NEGATIVE

## 2023-09-06 ENCOUNTER — Encounter: Payer: Self-pay | Admitting: Nurse Practitioner

## 2023-09-06 LAB — URINE CYTOLOGY ANCILLARY ONLY
Chlamydia: NEGATIVE
Comment: NEGATIVE
Comment: NEGATIVE
Comment: NORMAL
Neisseria Gonorrhea: NEGATIVE
Trichomonas: POSITIVE — AB

## 2023-09-07 ENCOUNTER — Telehealth (INDEPENDENT_AMBULATORY_CARE_PROVIDER_SITE_OTHER): Payer: BC Managed Care – PPO | Admitting: Nurse Practitioner

## 2023-09-07 ENCOUNTER — Encounter: Payer: Self-pay | Admitting: Nurse Practitioner

## 2023-09-07 DIAGNOSIS — A5901 Trichomonal vulvovaginitis: Secondary | ICD-10-CM | POA: Diagnosis not present

## 2023-09-07 DIAGNOSIS — B3731 Acute candidiasis of vulva and vagina: Secondary | ICD-10-CM | POA: Diagnosis not present

## 2023-09-07 MED ORDER — FLUCONAZOLE 150 MG PO TABS
150.0000 mg | ORAL_TABLET | Freq: Every day | ORAL | 0 refills | Status: DC
Start: 2023-09-07 — End: 2023-11-12

## 2023-09-07 MED ORDER — TINIDAZOLE 500 MG PO TABS: 2.0000 g | ORAL_TABLET | Freq: Once | ORAL | 0 refills | Status: AC

## 2023-09-07 NOTE — Progress Notes (Signed)
Virtual Visit via Video Note  I connected withNAME@ on 09/07/23 at  9:00 AM EDT by a video enabled telemedicine application and verified that I am speaking with the correct person using two identifiers.  Location: Patient:Home Provider: Office Participants: patient and provider  I discussed the limitations of evaluation and management by telemedicine and the availability of in person appointments. I also discussed with the patient that there may be a patient responsible charge related to this service. The patient expressed understanding and agreed to proceed.  [id number] recent urine cytology results  History of Present Illness: She is concerned about persistent positive trichomonas results. She took metronidazole as prescribed. She has not been sexually active since diagnosis, but reports reuse of toy. Today she has vaginal discharge, odor, and itching.  Observations/Objective:  Assessment and Plan: Dayanara was seen today for follow-up.  Diagnoses and all orders for this visit:  Trichomonas vaginitis -     tinidazole (TINDAMAX) 500 MG tablet; Take 4 tablets (2,000 mg total) by mouth once for 1 dose. -     Urine cytology ancillary only; Future  Candida vaginitis -     fluconazole (DIFLUCAN) 150 MG tablet; Take 1 tablet (150 mg total) by mouth daily. Take second tab 3days apart from first tab    Follow Up Instructions: Sent tindamax  and diflucan. Advised to replace devise is unable to lean with warm water and vinegar. Return to lab for repeat lab in 1week   I discussed the assessment and treatment plan with the patient. The patient was provided an opportunity to ask questions and all were answered. The patient agreed with the plan and demonstrated an understanding of the instructions.   The patient was advised to call back or seek an in-person evaluation if the symptoms worsen or if the condition fails to improve as anticipated.  Alysia Penna, NP

## 2023-09-14 ENCOUNTER — Other Ambulatory Visit (HOSPITAL_COMMUNITY)
Admission: RE | Admit: 2023-09-14 | Discharge: 2023-09-14 | Disposition: A | Discharge status: Home / Self Care | Payer: BC Managed Care – PPO | Source: Ambulatory Visit | Attending: Nurse Practitioner | Admitting: Nurse Practitioner

## 2023-09-14 ENCOUNTER — Other Ambulatory Visit: Payer: BC Managed Care – PPO

## 2023-09-14 DIAGNOSIS — Z8619 Personal history of other infectious and parasitic diseases: Secondary | ICD-10-CM | POA: Diagnosis not present

## 2023-09-14 DIAGNOSIS — A5901 Trichomonal vulvovaginitis: Secondary | ICD-10-CM

## 2023-09-14 DIAGNOSIS — Z113 Encounter for screening for infections with a predominantly sexual mode of transmission: Secondary | ICD-10-CM | POA: Insufficient documentation

## 2023-09-14 DIAGNOSIS — Z8742 Personal history of other diseases of the female genital tract: Secondary | ICD-10-CM | POA: Insufficient documentation

## 2023-09-14 DIAGNOSIS — L292 Pruritus vulvae: Secondary | ICD-10-CM | POA: Insufficient documentation

## 2023-09-16 LAB — URINE CYTOLOGY ANCILLARY ONLY
Chlamydia: NEGATIVE
Comment: NEGATIVE
Comment: NEGATIVE
Comment: NORMAL
Neisseria Gonorrhea: NEGATIVE
Trichomonas: NEGATIVE

## 2023-09-21 ENCOUNTER — Other Ambulatory Visit: Payer: Self-pay | Admitting: Nurse Practitioner

## 2023-09-21 DIAGNOSIS — E559 Vitamin D deficiency, unspecified: Secondary | ICD-10-CM

## 2023-09-24 DIAGNOSIS — Z419 Encounter for procedure for purposes other than remedying health state, unspecified: Secondary | ICD-10-CM | POA: Diagnosis not present

## 2023-09-25 ENCOUNTER — Emergency Department (HOSPITAL_COMMUNITY)
Admission: EM | Admit: 2023-09-25 | Discharge: 2023-09-25 | Disposition: A | Discharge status: Home / Self Care | Payer: BC Managed Care – PPO | Attending: Emergency Medicine | Admitting: Emergency Medicine

## 2023-09-25 ENCOUNTER — Encounter (HOSPITAL_COMMUNITY): Payer: Self-pay

## 2023-09-25 ENCOUNTER — Other Ambulatory Visit: Payer: Self-pay

## 2023-09-25 DIAGNOSIS — N760 Acute vaginitis: Secondary | ICD-10-CM | POA: Diagnosis not present

## 2023-09-25 DIAGNOSIS — N898 Other specified noninflammatory disorders of vagina: Secondary | ICD-10-CM | POA: Diagnosis not present

## 2023-09-25 DIAGNOSIS — B9689 Other specified bacterial agents as the cause of diseases classified elsewhere: Secondary | ICD-10-CM | POA: Diagnosis not present

## 2023-09-25 DIAGNOSIS — A599 Trichomoniasis, unspecified: Secondary | ICD-10-CM | POA: Diagnosis not present

## 2023-09-25 DIAGNOSIS — A5901 Trichomonal vulvovaginitis: Secondary | ICD-10-CM | POA: Diagnosis not present

## 2023-09-25 LAB — URINALYSIS, ROUTINE W REFLEX MICROSCOPIC
Bacteria, UA: NONE SEEN
Bilirubin Urine: NEGATIVE
Glucose, UA: NEGATIVE mg/dL
Hgb urine dipstick: NEGATIVE
Ketones, ur: NEGATIVE mg/dL
Leukocytes,Ua: NEGATIVE
Nitrite: NEGATIVE
Protein, ur: NEGATIVE mg/dL
Specific Gravity, Urine: 1.017 (ref 1.005–1.030)
pH: 8 (ref 5.0–8.0)

## 2023-09-25 LAB — WET PREP, GENITAL
Sperm: NONE SEEN
WBC, Wet Prep HPF POC: >=10 — AB (ref <10)
Yeast Wet Prep HPF POC: NONE SEEN

## 2023-09-25 LAB — PREGNANCY, URINE: Preg Test, Ur: NEGATIVE

## 2023-09-25 MED ORDER — METRONIDAZOLE 500 MG PO TABS: 500.0000 mg | ORAL_TABLET | Freq: Two times a day (BID) | ORAL | 0 refills | Status: DC

## 2023-09-25 NOTE — ED Triage Notes (Signed)
Reports vaginal discharge with odor for a few days.  Denies new sexual partner but her normal sexual partner. Unable to tell what color discharge is per patient. Also complains of itching.

## 2023-09-25 NOTE — Discharge Instructions (Signed)
Your history, exam, and evaluation today are consistent with recurrent trichomonas and BV infection.  We spoke to pharmacy who recommended 1 week of the Flagyl taken twice daily.  Please follow-up with your primary doctor and have your partner treated and clean everything that you may come in contact with thoroughly.  Given that your lack of any abdominal pain or other pain symptoms, we agreed to hold on extensive lab or imaging workup or pelvic exam and agreed with self swabbing again today.  Please rest and stay hydrated and follow with your primary doctor.  If any symptoms change or worsen acutely, please return to the nearest emergency department.

## 2023-09-25 NOTE — ED Provider Notes (Signed)
[facility]   CSN: [id number] Arrival date & time: 09/25/23  7829     History  Chief Complaint  Patient presents with   Vaginal Discharge    [facility] is a 37 y.o. female.  The history is provided by the patient and medical records. No language interpreter was used.  Vaginal Discharge Quality:  White and thin Severity:  Moderate Onset quality:  Gradual Duration:  3 days Timing:  Intermittent Chronicity:  Recurrent Relieved by:  Nothing Worsened by:  Nothing Ineffective treatments:  Prescription medications Associated symptoms: no abdominal pain, no dysuria, no fever, no genital lesions, no nausea, no rash, no urinary frequency, no urinary incontinence and no vomiting        Home Medications Prior to Admission medications   Medication Sig Start Date End Date Taking? Authorizing Provider  albuterol (VENTOLIN HFA) 108 (90 Base) MCG/ACT inhaler Inhale 2 puffs into the lungs every 6 (six) hours as needed for wheezing or shortness of breath. 08/01/23   Ward, Tylene Fantasia, [facility]  fluconazole (DIFLUCAN) 150 MG tablet Take 1 tablet (150 mg total) by mouth daily. Take second tab 3days apart from first tab 09/07/23   Nche, Bonna Gains, [facility]  ibuprofen (ADVIL) 600 MG tablet Take 1 tablet (600 mg total) by mouth every 8 (eight) hours as needed. 04/25/23   Ward, Tylene Fantasia, [facility]  omeprazole (PRILOSEC) 20 MG capsule Take 1 capsule (20 mg total) by mouth in the morning. 09/14/22   Nche, Bonna Gains, [facility]  ondansetron (ZOFRAN) 4 MG tablet Take 1 tablet (4 mg total) by mouth every 8 (eight) hours as needed for nausea or vomiting. 08/24/23   Nche, Bonna Gains, [facility]  potassium chloride SA (KLOR-CON M) 20 MEQ tablet Take 1 tablet (20 mEq total) by mouth 2 (two) times daily. 06/29/23   Nche, Bonna Gains, [facility]  pravastatin (PRAVACHOL) 20 MG tablet TAKE 1 TABLET BY MOUTH AT BEDTIME (DISCONTINUE  FENOFIBRATE) 08/16/23   Nche, Bonna Gains,  [facility]  tirzepatide (ZEPBOUND) 7.5 MG/0.5ML Pen Inject 7.5 mg into the skin once a week. 06/24/23   Nche, Bonna Gains, [facility]  triamterene-hydrochlorothiazide (MAXZIDE-25) 37.5-25 MG tablet Take 0.5 tablets by mouth daily. 06/29/23   Nche, Bonna Gains, [facility]  Vitamin D, Ergocalciferol, (DRISDOL) 1.25 MG (50000 UNIT) CAPS capsule Take 1 capsule (50,000 Units total) by mouth every 7 (seven) days. 06/24/23   Nche, Bonna Gains, [facility]      Allergies    Patient has no known allergies.    Review of Systems   Review of Systems  Constitutional:  Negative for chills, fatigue and fever.  HENT:  Negative for congestion.   Respiratory:  Negative for cough, chest tightness and shortness of breath.   Gastrointestinal:  Negative for abdominal pain, nausea and vomiting.  Genitourinary:  Positive for vaginal discharge. Negative for bladder incontinence, dysuria, flank pain, frequency, genital sores, pelvic pain, vaginal bleeding and vaginal pain.  Musculoskeletal:  Negative for back pain.  Neurological:  Negative for headaches.  All other systems reviewed and are negative.   Physical Exam Updated Vital Signs BP (!) 142/96 (BP Location: Right Arm)   Pulse 69   Temp 98.3 F (36.8 C) (Oral)   Resp 16   Ht 5\' 8"  (1.727 m)   Wt 98 kg   SpO2 100%   BMI 32.84 kg/m  Physical Exam Vitals and nursing note reviewed.  Constitutional:      General: She is not in acute  distress.    Appearance: She is well-developed. She is not ill-appearing, toxic-appearing or diaphoretic.  HENT:     Head: Normocephalic and atraumatic.     Nose: No congestion or rhinorrhea.     Mouth/Throat:     Mouth: Mucous membranes are moist.     Pharynx: No oropharyngeal exudate or posterior oropharyngeal erythema.  Eyes:     Extraocular Movements: Extraocular movements intact.     Conjunctiva/sclera: Conjunctivae normal.     Pupils: Pupils are equal, round, and reactive to light.  Cardiovascular:     Rate and Rhythm: Normal rate and regular  rhythm.     Heart sounds: No murmur heard. Pulmonary:     Effort: Pulmonary effort is normal. No respiratory distress.     Breath sounds: Normal breath sounds. No wheezing, rhonchi or rales.  Chest:     Chest wall: No tenderness.  Abdominal:     General: Abdomen is flat.     Palpations: Abdomen is soft.     Tenderness: There is no abdominal tenderness. There is no guarding or rebound.  Genitourinary:    Comments: Deferred by patient when offered Musculoskeletal:     Cervical back: Neck supple.  Skin:    General: Skin is warm and dry.     Capillary Refill: Capillary refill takes less than 2 seconds.     Findings: No erythema.  Neurological:     General: No focal deficit present.     Mental Status: She is alert.     Motor: No weakness.  Psychiatric:        Mood and Affect: Mood normal.     ED Results / Procedures / Treatments   Labs (all labs ordered are listed, but only abnormal results are displayed) Labs Reviewed  WET PREP, GENITAL - Abnormal; Notable for the following components:      Result Value   Trich, Wet Prep PRESENT (*)    Clue Cells Wet Prep HPF POC PRESENT (*)    WBC, Wet Prep HPF POC >=10 (*)    All other components within normal limits  URINALYSIS, ROUTINE W REFLEX MICROSCOPIC - Abnormal; Notable for the following components:   APPearance TURBID (*)    All other components within normal limits  PREGNANCY, URINE  GC/CHLAMYDIA PROBE AMP ([facility]) NOT AT [facility]    EKG None  Radiology No results found.  Procedures Procedures    Medications Ordered in ED Medications - No data to display  ED Course/ Medical Decision Making/ A&P                                 Medical Decision Making Amount and/or Complexity of Data Reviewed Labs: ordered.    [facility] is a 37 y.o. female with a past medical history significant for hypertension, GERD, hyperlipidemia, and recent trichomonas infection who presents with recurrent vaginal discharge.   According patient, she was diagnosed with trichomonas and was given different medications.  She reports that she has taken 2 treatments and has had symptoms develop again over the last few days.  She reports no dysuria or any groin pain.  No abdominal pain or flank pain.  No fevers, chills, congestion, cough, nausea, vomiting, constipation, diarrhea.  Denies any other injuries.  Wants to make sure she does not have trichomonas or BV again.  On exam, lungs clear.  Chest nontender.  Abdomen nontender.  We offered pelvic exam but patient  would rather do a self swab since he is not having any pain, this is felt to be reasonable.  She self swabbed and her wet prep does indeed show trichomonas and BV recurrence.  We spoke to pharmacy given the clinical context and multiple treatments and they recommend 1 week of Flagyl twice daily to treat.  This was ordered for her.  Her urinalysis did not show UTI and given her lack of pain we agreed to hold on more extensive workup with labs and imaging or ultrasound or pelvic exam at this time.  Patient knows to clean any devices that may be vaginally inserted and will also have her partner get retreated.  She understands return precautions and follow-up instructions and was discharged in good condition.         Final Clinical Impression(s) / ED Diagnoses Final diagnoses:  BV (bacterial vaginosis)  Vaginal discharge  Trichomonal infection    Rx / DC Orders ED Discharge Orders          Ordered    metroNIDAZOLE (FLAGYL) 500 MG tablet  2 times daily        09/25/23 1333            Clinical Impression: 1. BV (bacterial vaginosis)   2. Vaginal discharge   3. Trichomonal infection     Disposition: Discharge  Condition: Good  I have discussed the results, Dx and Tx plan with the pt(& family if present). He/she/they expressed understanding and agree(s) with the plan. Discharge instructions discussed at great length. Strict return precautions  discussed and pt &/or family have verbalized understanding of the instructions. No further questions at time of discharge.    New Prescriptions   METRONIDAZOLE (FLAGYL) 500 MG TABLET    Take 1 tablet (500 mg total) by mouth 2 (two) times daily.    Follow Up: Anne Ng, [facility] [address] [place] [address] [phone]     [facility] Emergency Department at [facility] [address] [place] [address] [phone]        Garnett Nunziata, Canary Brim, MD 09/25/23 [phone]

## 2023-09-27 LAB — GC/CHLAMYDIA PROBE AMP (~~LOC~~) NOT AT ARMC
Chlamydia: NEGATIVE
Comment: NEGATIVE
Comment: NORMAL
Neisseria Gonorrhea: NEGATIVE

## 2023-09-28 ENCOUNTER — Other Ambulatory Visit: Payer: Self-pay | Admitting: Nurse Practitioner

## 2023-09-28 ENCOUNTER — Encounter: Payer: Self-pay | Admitting: Nurse Practitioner

## 2023-09-28 DIAGNOSIS — A5901 Trichomonal vulvovaginitis: Secondary | ICD-10-CM

## 2023-10-01 ENCOUNTER — Other Ambulatory Visit: Payer: BC Managed Care – PPO

## 2023-10-04 ENCOUNTER — Other Ambulatory Visit (HOSPITAL_COMMUNITY)
Admission: RE | Admit: 2023-10-04 | Discharge: 2023-10-04 | Disposition: A | Discharge status: Home / Self Care | Payer: BC Managed Care – PPO | Source: Ambulatory Visit | Attending: Nurse Practitioner | Admitting: Nurse Practitioner

## 2023-10-04 ENCOUNTER — Other Ambulatory Visit (INDEPENDENT_AMBULATORY_CARE_PROVIDER_SITE_OTHER): Payer: BC Managed Care – PPO

## 2023-10-04 DIAGNOSIS — A5901 Trichomonal vulvovaginitis: Secondary | ICD-10-CM | POA: Insufficient documentation

## 2023-10-06 ENCOUNTER — Encounter: Payer: Self-pay | Admitting: Nurse Practitioner

## 2023-10-06 LAB — URINE CYTOLOGY ANCILLARY ONLY
Chlamydia: NEGATIVE
Comment: NEGATIVE
Comment: NEGATIVE
Comment: NORMAL
Neisseria Gonorrhea: NEGATIVE
Trichomonas: NEGATIVE

## 2023-10-24 DIAGNOSIS — Z419 Encounter for procedure for purposes other than remedying health state, unspecified: Secondary | ICD-10-CM | POA: Diagnosis not present

## 2023-10-28 ENCOUNTER — Ambulatory Visit: Payer: BC Managed Care – PPO | Admitting: Nurse Practitioner

## 2023-11-12 ENCOUNTER — Other Ambulatory Visit (HOSPITAL_COMMUNITY)
Admission: RE | Admit: 2023-11-12 | Discharge: 2023-11-12 | Disposition: A | Discharge status: Home / Self Care | Payer: BC Managed Care – PPO | Source: Ambulatory Visit | Attending: Nurse Practitioner | Admitting: Nurse Practitioner

## 2023-11-12 ENCOUNTER — Ambulatory Visit (INDEPENDENT_AMBULATORY_CARE_PROVIDER_SITE_OTHER): Payer: BC Managed Care – PPO | Admitting: Nurse Practitioner

## 2023-11-12 ENCOUNTER — Encounter: Payer: Self-pay | Admitting: Nurse Practitioner

## 2023-11-12 ENCOUNTER — Telehealth: Payer: Self-pay | Admitting: Nurse Practitioner

## 2023-11-12 VITALS — BP 129/81 | HR 84 | Temp 98.7°F | Resp 18 | Ht 68.0 in | Wt 226.6 lb

## 2023-11-12 DIAGNOSIS — N921 Excessive and frequent menstruation with irregular cycle: Secondary | ICD-10-CM

## 2023-11-12 DIAGNOSIS — N898 Other specified noninflammatory disorders of vagina: Secondary | ICD-10-CM

## 2023-11-12 DIAGNOSIS — Z6834 Body mass index (BMI) 34.0-34.9, adult: Secondary | ICD-10-CM

## 2023-11-12 DIAGNOSIS — E6609 Other obesity due to excess calories: Secondary | ICD-10-CM | POA: Diagnosis not present

## 2023-11-12 DIAGNOSIS — K5903 Drug induced constipation: Secondary | ICD-10-CM | POA: Diagnosis not present

## 2023-11-12 DIAGNOSIS — E559 Vitamin D deficiency, unspecified: Secondary | ICD-10-CM | POA: Diagnosis not present

## 2023-11-12 DIAGNOSIS — E66811 Obesity, class 1: Secondary | ICD-10-CM

## 2023-11-12 DIAGNOSIS — I10 Essential (primary) hypertension: Secondary | ICD-10-CM | POA: Diagnosis not present

## 2023-11-12 DIAGNOSIS — Z6833 Body mass index (BMI) 33.0-33.9, adult: Secondary | ICD-10-CM

## 2023-11-12 DIAGNOSIS — R7303 Prediabetes: Secondary | ICD-10-CM

## 2023-11-12 LAB — COMPREHENSIVE METABOLIC PANEL
ALT: 16 U/L (ref 0–35)
AST: 15 U/L (ref 0–37)
Albumin: 4.2 g/dL (ref 3.5–5.2)
Alkaline Phosphatase: 67 U/L (ref 39–117)
BUN: 9 mg/dL (ref 6–23)
CO2: 30 meq/L (ref 19–32)
Calcium: 9.2 mg/dL (ref 8.4–10.5)
Chloride: 101 meq/L (ref 96–112)
Creatinine, Ser: 0.54 mg/dL (ref 0.40–1.20)
GFR: 116.76 mL/min (ref >60.00)
Glucose, Bld: 84 mg/dL (ref 70–99)
Potassium: 3.1 meq/L — ABNORMAL LOW (ref 3.5–5.1)
Sodium: 140 meq/L (ref 135–145)
Total Bilirubin: 0.4 mg/dL (ref 0.2–1.2)
Total Protein: 7 g/dL (ref 6.0–8.3)

## 2023-11-12 LAB — VITAMIN D 25 HYDROXY (VIT D DEFICIENCY, FRACTURES): VITD: 20.05 ng/mL — ABNORMAL LOW (ref 30.00–100.00)

## 2023-11-12 LAB — HEMOGLOBIN A1C: Hgb A1c MFr Bld: 5.9 % (ref 4.6–6.5)

## 2023-11-12 MED ORDER — ZEPBOUND 7.5 MG/0.5ML ~~LOC~~ SOAJ: 7.5000 mg | SUBCUTANEOUS | 0 refills | Status: DC

## 2023-11-12 NOTE — Patient Instructions (Signed)
Go to lab Continue Heart healthy diet and daily exercise. Maintain current medications. use miralax or dulcolax for constipation

## 2023-11-12 NOTE — Progress Notes (Signed)
Established Patient Visit  Patient: Jessica Greene   DOB: 14-Feb-1986   37 y.o. Female  MRN: [id number] Visit Date: 11/12/2023  Subjective:    Chief Complaint  Patient presents with   OFFICE VISIT     2 month follow up weight management    Hyperlipidemia   HPI Reports recurrent vaginal odor and discharge. No pelvic pain.  Obesity Gained 10lbs in last 381month Admits to poor diet and decreased exercise. Reports constipation with zepbound 7.5mg , no nausea, no ABDOMEN pain Wt Readings from Last 3 Encounters:  11/12/23 226 lb 9.6 oz (102.8 kg)  09/25/23 216 lb (98 kg)  08/24/23 216 lb (98 kg)    Advised to start miralax daily, maintain high fiber diet and adequate oral hydration, resume daily exercise Check CMP today Maintain med dose F/up in 381month  Prediabetes Repeat hgbA1c  HTN (hypertension) BP at goal with HCTZ BP Readings from Last 3 Encounters:  11/12/23 129/81  09/25/23 (!) 135/90  08/24/23 120/70    Repeat CMP Maintain med dose  Wt Readings from Last 3 Encounters:  11/12/23 226 lb 9.6 oz (102.8 kg)  09/25/23 216 lb (98 kg)  08/24/23 216 lb (98 kg)    Reviewed medical, surgical, and social history today  Medications: Outpatient Medications Prior to Visit  Medication Sig   albuterol (VENTOLIN HFA) 108 (90 Base) MCG/ACT inhaler Inhale 2 puffs into the lungs every 6 (six) hours as needed for wheezing or shortness of breath.   omeprazole (PRILOSEC) 20 MG capsule Take 1 capsule (20 mg total) by mouth in the morning.   potassium chloride SA (KLOR-CON M) 20 MEQ tablet Take 1 tablet (20 mEq total) by mouth 2 (two) times daily.   pravastatin (PRAVACHOL) 20 MG tablet TAKE 1 TABLET BY MOUTH AT BEDTIME (DISCONTINUE  FENOFIBRATE)   tirzepatide (ZEPBOUND) 7.5 MG/0.5ML Pen Inject 7.5 mg into the skin once a week.   triamterene-hydrochlorothiazide (MAXZIDE-25) 37.5-25 MG tablet Take 0.5 tablets by mouth daily.   [DISCONTINUED] metroNIDAZOLE (FLAGYL)  500 MG tablet Take 1 tablet (500 mg total) by mouth 2 (two) times daily.   [DISCONTINUED] tinidazole (TINDAMAX) 500 MG tablet Take 2,000 mg by mouth once.   [DISCONTINUED] Vitamin D, Ergocalciferol, (DRISDOL) 1.25 MG (50000 UNIT) CAPS capsule Take 1 capsule (50,000 Units total) by mouth every 7 (seven) days.   [DISCONTINUED] fluconazole (DIFLUCAN) 150 MG tablet Take 1 tablet (150 mg total) by mouth daily. Take second tab 3days apart from first tab (Patient not taking: Reported on 11/12/2023)   [DISCONTINUED] ibuprofen (ADVIL) 600 MG tablet Take 1 tablet (600 mg total) by mouth every 8 (eight) hours as needed. (Patient not taking: Reported on 11/12/2023)   [DISCONTINUED] ondansetron (ZOFRAN) 4 MG tablet Take 1 tablet (4 mg total) by mouth every 8 (eight) hours as needed for nausea or vomiting. (Patient not taking: Reported on 11/12/2023)   No facility-administered medications prior to visit.   Reviewed past medical and social history.   ROS per HPI above      Objective:  BP 129/81 (BP Location: Left Arm, Patient Position: Sitting, Cuff Size: Large)   Pulse 84   Temp 98.7 F (37.1 C) (Temporal)   Resp 18   Ht 5\' 8"  (1.727 m)   Wt 226 lb 9.6 oz (102.8 kg)   SpO2 100%   BMI 34.45 kg/m      Physical Exam Vitals and nursing note reviewed.  Cardiovascular:  Rate and Rhythm: Normal rate.     Pulses: Normal pulses.  Pulmonary:     Effort: Pulmonary effort is normal.  Abdominal:     General: Bowel sounds are normal.     Palpations: Abdomen is soft.  Neurological:     Mental Status: She is alert and oriented to person, place, and time.     No results found for any visits on 11/12/23.    Assessment & Plan:    Problem List Items Addressed This Visit     HTN (hypertension) - Primary   BP at goal with HCTZ BP Readings from Last 3 Encounters:  11/12/23 129/81  09/25/23 (!) 135/90  08/24/23 120/70    Repeat CMP Maintain med dose      Relevant Orders   Comprehensive  metabolic panel   Obesity   Gained 10lbs in last 66month Admits to poor diet and decreased exercise. Reports constipation with zepbound 7.5mg , no nausea, no ABDOMEN pain Wt Readings from Last 3 Encounters:  11/12/23 226 lb 9.6 oz (102.8 kg)  09/25/23 216 lb (98 kg)  08/24/23 216 lb (98 kg)    Advised to start miralax daily, maintain high fiber diet and adequate oral hydration, resume daily exercise Check CMP today Maintain med dose F/up in 66month      Relevant Orders   Comprehensive metabolic panel   Prediabetes   Repeat hgbA1c      Relevant Orders   Hemoglobin A1c   Comprehensive metabolic panel   Other Visit Diagnoses       Drug-induced constipation         Vitamin D deficiency       Relevant Orders   VITAMIN D 25 Hydroxy (Vit-D Deficiency, Fractures)     Vaginal odor       Relevant Orders   Cervicovaginal ancillary only( [employer] [place] [facility])      Return in about 4 weeks (around 12/10/2023) for Weight management.     Alysia Penna, NP

## 2023-11-12 NOTE — Assessment & Plan Note (Signed)
Repeat hgbA1c 

## 2023-11-12 NOTE — Assessment & Plan Note (Signed)
BP at goal with HCTZ BP Readings from Last 3 Encounters:  11/12/23 129/81  09/25/23 (!) 135/90  08/24/23 120/70    Repeat CMP Maintain med dose

## 2023-11-12 NOTE — Telephone Encounter (Signed)
See note

## 2023-11-12 NOTE — Assessment & Plan Note (Signed)
Gained 10lbs in last 63month Admits to poor diet and decreased exercise. Reports constipation with zepbound 7.5mg , no nausea, no ABDOMEN pain Wt Readings from Last 3 Encounters:  11/12/23 226 lb 9.6 oz (102.8 kg)  09/25/23 216 lb (98 kg)  08/24/23 216 lb (98 kg)    Advised to start miralax daily, maintain high fiber diet and adequate oral hydration, resume daily exercise Check CMP today Maintain med dose F/up in 63month

## 2023-11-15 ENCOUNTER — Other Ambulatory Visit: Payer: Self-pay | Admitting: Nurse Practitioner

## 2023-11-15 DIAGNOSIS — E782 Mixed hyperlipidemia: Secondary | ICD-10-CM

## 2023-11-16 ENCOUNTER — Other Ambulatory Visit: Payer: Self-pay | Admitting: Family

## 2023-11-16 LAB — CERVICOVAGINAL ANCILLARY ONLY
Bacterial Vaginitis (gardnerella): NEGATIVE
Candida Glabrata: POSITIVE — AB
Candida Vaginitis: NEGATIVE
Chlamydia: NEGATIVE
Comment: NEGATIVE
Comment: NEGATIVE
Comment: NEGATIVE
Comment: NEGATIVE
Comment: NEGATIVE
Comment: NORMAL
Neisseria Gonorrhea: NEGATIVE
Trichomonas: NEGATIVE

## 2023-11-16 MED ORDER — FLUCONAZOLE 150 MG PO TABS: 150.0000 mg | ORAL_TABLET | ORAL | 0 refills | Status: DC

## 2023-11-24 DIAGNOSIS — Z419 Encounter for procedure for purposes other than remedying health state, unspecified: Secondary | ICD-10-CM | POA: Diagnosis not present

## 2023-12-15 ENCOUNTER — Ambulatory Visit: Payer: BC Managed Care – PPO | Admitting: Nurse Practitioner

## 2023-12-15 ENCOUNTER — Encounter: Payer: Self-pay | Admitting: Nurse Practitioner

## 2023-12-15 VITALS — BP 124/82 | HR 76 | Temp 98.2°F | Resp 18 | Ht 68.0 in | Wt 215.8 lb

## 2023-12-15 DIAGNOSIS — E66811 Obesity, class 1: Secondary | ICD-10-CM | POA: Diagnosis not present

## 2023-12-15 DIAGNOSIS — Z6832 Body mass index (BMI) 32.0-32.9, adult: Secondary | ICD-10-CM | POA: Diagnosis not present

## 2023-12-15 DIAGNOSIS — N921 Excessive and frequent menstruation with irregular cycle: Secondary | ICD-10-CM | POA: Diagnosis not present

## 2023-12-15 DIAGNOSIS — E6609 Other obesity due to excess calories: Secondary | ICD-10-CM | POA: Diagnosis not present

## 2023-12-15 LAB — POCT URINE PREGNANCY: Preg Test, Ur: NEGATIVE

## 2023-12-15 NOTE — Progress Notes (Signed)
Established Patient Visit  Patient: Jessica Greene   DOB: [birth year]   38 y.o. Female  MRN: [id number] Visit Date: 12/15/2023  Subjective:    Chief Complaint  Patient presents with   Weight Managment    HPI Obesity Persistent nausea, heartburn and constipation. She did not take miralax as prescribed. Has maintain high protein diet and adequate oral hydration, but minimal fiber intake. Wt Readings from Last 3 Encounters:  12/15/23 215 lb 12.8 oz (97.9 kg)  11/12/23 226 lb 9.6 oz (102.8 kg)  09/25/23 216 lb (98 kg)    Negative urine pregnancy today Hold zepbound x 2weeks Advised to start miralax and omeprazole daily, maintain high fiber diet and adequate oral hydration. F/up in 2weeks Consider resuming zepbound 5mg  if symptoms improve/resolve  Reviewed medical, surgical, and social history today  Medications: Outpatient Medications Prior to Visit  Medication Sig   albuterol (VENTOLIN HFA) 108 (90 Base) MCG/ACT inhaler Inhale 2 puffs into the lungs every 6 (six) hours as needed for wheezing or shortness of breath.   omeprazole (PRILOSEC) 20 MG capsule Take 1 capsule (20 mg total) by mouth in the morning.   potassium chloride SA (KLOR-CON M) 20 MEQ tablet Take 1 tablet (20 mEq total) by mouth 2 (two) times daily.   pravastatin (PRAVACHOL) 20 MG tablet TAKE 1 TABLET BY MOUTH AT BEDTIME   triamterene-hydrochlorothiazide (MAXZIDE-25) 37.5-25 MG tablet Take 0.5 tablets by mouth daily.   [DISCONTINUED] tirzepatide (ZEPBOUND) 7.5 MG/0.5ML Pen Inject 7.5 mg into the skin once a week.   [DISCONTINUED] fluconazole (DIFLUCAN) 150 MG tablet Take 1 tablet (150 mg total) by mouth once a week. (Patient not taking: Reported on 12/15/2023)   No facility-administered medications prior to visit.   Reviewed past medical and social history.   ROS per HPI above      Objective:  BP 124/82 (BP Location: Left Arm, Patient Position: Sitting, Cuff Size: Large)   Pulse 76    Temp 98.2 F (36.8 C) (Temporal)   Resp 18   Ht 5\' 8"  (1.727 m)   Wt 215 lb 12.8 oz (97.9 kg)   LMP  (LMP Unknown)   SpO2 99%   BMI 32.81 kg/m      Physical Exam Vitals and nursing note reviewed.  Constitutional:      Appearance: She is obese.  Cardiovascular:     Rate and Rhythm: Normal rate and regular rhythm.     Pulses: Normal pulses.     Heart sounds: Normal heart sounds.  Pulmonary:     Effort: Pulmonary effort is normal.     Breath sounds: Normal breath sounds.  Abdominal:     General: Bowel sounds are normal. There is no distension.     Palpations: Abdomen is soft. There is no mass.     Tenderness: There is no abdominal tenderness. There is no right CVA tenderness, left CVA tenderness or guarding.     Hernia: No hernia is present.  Neurological:     Mental Status: She is alert and oriented to person, place, and time.  Psychiatric:        Mood and Affect: Mood normal.        Behavior: Behavior normal.     Results for orders placed or performed in visit on 12/15/23  POCT urine pregnancy  Result Value Ref Range   Preg Test, Ur Negative Negative      Assessment & Plan:  Problem List Items Addressed This Visit     Menorrhagia with irregular cycle   Relevant Orders   POCT urine pregnancy (Completed)   Obesity - Primary   Persistent nausea, heartburn and constipation. She did not take miralax as prescribed. Has maintain high protein diet and adequate oral hydration, but minimal fiber intake. Wt Readings from Last 3 Encounters:  12/15/23 215 lb 12.8 oz (97.9 kg)  11/12/23 226 lb 9.6 oz (102.8 kg)  09/25/23 216 lb (98 kg)    Negative urine pregnancy today Hold zepbound x 2weeks Advised to start miralax and omeprazole daily, maintain high fiber diet and adequate oral hydration. F/up in 2weeks Consider resuming zepbound 5mg  if symptoms improve/resolve      Return in about 2 weeks (around 12/29/2023) for Weight management, constipation, and GERD.      Alysia Penna, NP

## 2023-12-15 NOTE — Assessment & Plan Note (Addendum)
Persistent nausea, heartburn and constipation. She did not take miralax as prescribed. Has maintain high protein diet and adequate oral hydration, but minimal fiber intake. Wt Readings from Last 3 Encounters:  12/15/23 215 lb 12.8 oz (97.9 kg)  11/12/23 226 lb 9.6 oz (102.8 kg)  09/25/23 216 lb (98 kg)    Negative urine pregnancy today Hold zepbound x 2weeks Advised to start miralax and omeprazole daily, maintain high fiber diet and adequate oral hydration. F/up in 2weeks Consider resuming zepbound 5mg  if symptoms improve/resolve

## 2023-12-15 NOTE — Patient Instructions (Signed)
Hold zepbound injection. Start miralax 17g daily and omeprazole 20mg  in AM

## 2023-12-25 DIAGNOSIS — Z419 Encounter for procedure for purposes other than remedying health state, unspecified: Secondary | ICD-10-CM | POA: Diagnosis not present

## 2023-12-27 ENCOUNTER — Telehealth: Payer: BC Managed Care – PPO | Admitting: Family Medicine

## 2023-12-27 DIAGNOSIS — L2082 Flexural eczema: Secondary | ICD-10-CM

## 2023-12-27 MED ORDER — TRIAMCINOLONE ACETONIDE 0.1 % EX CREA
1.0000 | TOPICAL_CREAM | Freq: Two times a day (BID) | CUTANEOUS | 0 refills | Status: DC
Start: 2023-12-27 — End: 2024-07-18

## 2023-12-27 NOTE — Progress Notes (Signed)

## 2023-12-30 ENCOUNTER — Other Ambulatory Visit (HOSPITAL_COMMUNITY): Payer: Self-pay

## 2023-12-30 ENCOUNTER — Ambulatory Visit: Payer: BC Managed Care – PPO | Admitting: Nurse Practitioner

## 2023-12-30 ENCOUNTER — Encounter: Payer: Self-pay | Admitting: Nurse Practitioner

## 2023-12-30 VITALS — BP 108/80 | Temp 98.2°F | Ht 68.0 in | Wt 219.2 lb

## 2023-12-30 DIAGNOSIS — Z6833 Body mass index (BMI) 33.0-33.9, adult: Secondary | ICD-10-CM

## 2023-12-30 DIAGNOSIS — E66811 Obesity, class 1: Secondary | ICD-10-CM

## 2023-12-30 DIAGNOSIS — Z803 Family history of malignant neoplasm of breast: Secondary | ICD-10-CM | POA: Insufficient documentation

## 2023-12-30 DIAGNOSIS — E6609 Other obesity due to excess calories: Secondary | ICD-10-CM

## 2023-12-30 DIAGNOSIS — N921 Excessive and frequent menstruation with irregular cycle: Secondary | ICD-10-CM

## 2023-12-30 DIAGNOSIS — Z800 Family history of malignant neoplasm of digestive organs: Secondary | ICD-10-CM

## 2023-12-30 DIAGNOSIS — Z832 Family history of diseases of the blood and blood-forming organs and certain disorders involving the immune mechanism: Secondary | ICD-10-CM

## 2023-12-30 MED ORDER — TIRZEPATIDE-WEIGHT MANAGEMENT 5 MG/0.5ML ~~LOC~~ SOLN: 5.0000 mg | SUBCUTANEOUS | 2 refills | Status: DC

## 2023-12-30 NOTE — Assessment & Plan Note (Signed)
 Resolved constipation and GERD symptoms with discontinuation of wegovy  7.5mg . BP Readings from Last 3 Encounters:  12/30/23 108/80  12/15/23 124/82  11/12/23 129/81    Wt Readings from Last 3 Encounters:  12/30/23 219 lb 3.2 oz (99.4 kg)  12/15/23 215 lb 12.8 oz (97.9 kg)  11/12/23 226 lb 9.6 oz (102.8 kg)    Agreed to resume zepbound  5mg  Advised to continue use of miralax 17g daily, high fiber diet, and adequate oral hydration. F/up in 3months

## 2023-12-30 NOTE — Patient Instructions (Signed)
 Maintain miralax daily as needed, adequate oral hydration and high fiber diet. Schedule appointment with GYN and cardiology.

## 2023-12-30 NOTE — Progress Notes (Signed)
 Established Patient Visit  Patient: Jessica Greene   DOB: [birth year]   38 y.o. Female  MRN: [id number] Visit Date: 12/30/2023  Subjective:    Chief Complaint  Patient presents with   Weight Management    Follow up with constipation, concerns with menstrual lasting for 2 weeks   HPI Obesity Resolved constipation and GERD symptoms with discontinuation of wegovy  7.5mg . BP Readings from Last 3 Encounters:  12/30/23 108/80  12/15/23 124/82  11/12/23 129/81    Wt Readings from Last 3 Encounters:  12/30/23 219 lb 3.2 oz (99.4 kg)  12/15/23 215 lb 12.8 oz (97.9 kg)  11/12/23 226 lb 9.6 oz (102.8 kg)    Agreed to resume zepbound  5mg  Advised to continue use of miralax 17g daily, high fiber diet, and adequate oral hydration. F/up in 3months  Family history of breast cancer Father Agreed to genetic counselor referral  Family history of pancreatic cancer Father Agreed to genetic counselor referral  Family history of clotting disorder Father and sister Agreed to genetic counselor referral  Menorrhagia with irregular cycle Advised to schedule appointment with GYN   Reviewed medical, surgical, and social history today  Medications: Outpatient Medications Prior to Visit  Medication Sig   albuterol  (VENTOLIN  HFA) 108 (90 Base) MCG/ACT inhaler Inhale 2 puffs into the lungs every 6 (six) hours as needed for wheezing or shortness of breath.   omeprazole  (PRILOSEC) 20 MG capsule Take 1 capsule (20 mg total) by mouth in the morning.   potassium chloride  SA (KLOR-CON  M) 20 MEQ tablet Take 1 tablet (20 mEq total) by mouth 2 (two) times daily.   pravastatin  (PRAVACHOL ) 20 MG tablet TAKE 1 TABLET BY MOUTH AT BEDTIME   triamcinolone  cream (KENALOG ) 0.1 % Apply 1 Application topically 2 (two) times daily.   triamterene -hydrochlorothiazide  (MAXZIDE-25) 37.5-25 MG tablet Take 0.5 tablets by mouth daily.   No facility-administered medications prior to visit.   Reviewed  past medical and social history.   ROS per HPI above      Objective:  BP 108/80 (BP Location: Right Arm, Patient Position: Sitting, Cuff Size: Normal)   Temp 98.2 F (36.8 C)   Ht 5' 8 (1.727 m)   Wt 219 lb 3.2 oz (99.4 kg)   LMP 12/20/2023 (Exact Date)   SpO2 98%   BMI 33.33 kg/m      Physical Exam Vitals and nursing note reviewed.  Cardiovascular:     Rate and Rhythm: Normal rate and regular rhythm.     Pulses: Normal pulses.     Heart sounds: Normal heart sounds.  Pulmonary:     Effort: Pulmonary effort is normal.     Breath sounds: Normal breath sounds.  Neurological:     Mental Status: She is alert and oriented to person, place, and time.     No results found for any visits on 12/30/23.    Assessment & Plan:    Problem List Items Addressed This Visit     Family history of breast cancer   Father Agreed to genetic counselor referral      Relevant Orders   Ambulatory referral to Genetics   Family history of clotting disorder   Father and sister Agreed to genetic counselor referral      Relevant Orders   Ambulatory referral to Genetics   Family history of pancreatic cancer   Father Agreed to genetic counselor referral      Relevant Orders  Ambulatory referral to Genetics   Menorrhagia with irregular cycle   Advised to schedule appointment with GYN      Obesity - Primary   Resolved constipation and GERD symptoms with discontinuation of wegovy  7.5mg . BP Readings from Last 3 Encounters:  12/30/23 108/80  12/15/23 124/82  11/12/23 129/81    Wt Readings from Last 3 Encounters:  12/30/23 219 lb 3.2 oz (99.4 kg)  12/15/23 215 lb 12.8 oz (97.9 kg)  11/12/23 226 lb 9.6 oz (102.8 kg)    Agreed to resume zepbound  5mg  Advised to continue use of miralax 17g daily, high fiber diet, and adequate oral hydration. F/up in 3months      Relevant Medications   tirzepatide  5 MG/0.5ML injection vial   Return in about 4 weeks (around 01/27/2024) for Weight  management.     Roselie Mood, NP

## 2023-12-30 NOTE — Assessment & Plan Note (Signed)
 Advised to schedule appointment with GYN

## 2023-12-30 NOTE — Assessment & Plan Note (Signed)
 Father and sister Agreed to genetic counselor referral

## 2023-12-30 NOTE — Assessment & Plan Note (Signed)
 Father Agreed to genetic counselor referral

## 2023-12-30 NOTE — Assessment & Plan Note (Signed)
>>  ASSESSMENT AND PLAN FOR MENORRHAGIA WITH IRREGULAR CYCLE WRITTEN ON 12/30/2023 12:49 PM BY Mahli Glahn LUM, NP  Advised to schedule appointment with GYN

## 2024-01-05 ENCOUNTER — Telehealth: Payer: Self-pay | Admitting: Genetic Counselor

## 2024-01-05 NOTE — Telephone Encounter (Signed)
Left voicemail to call back for genetic counseling.

## 2024-01-06 ENCOUNTER — Telehealth: Payer: Self-pay | Admitting: Genetic Counselor

## 2024-01-06 NOTE — Telephone Encounter (Signed)
Left a voicemail to call back

## 2024-01-10 ENCOUNTER — Telehealth: Payer: Self-pay | Admitting: Genetic Counselor

## 2024-01-10 NOTE — Telephone Encounter (Signed)
Attempted to call the patient to inform her that we would be closing her referral for genetic counseling, but her voicemail box was full. Left her spouse a voicemail stating the referral would be closed.

## 2024-01-22 DIAGNOSIS — Z419 Encounter for procedure for purposes other than remedying health state, unspecified: Secondary | ICD-10-CM | POA: Diagnosis not present

## 2024-01-23 ENCOUNTER — Telehealth: Admitting: Family Medicine

## 2024-01-23 DIAGNOSIS — K0889 Other specified disorders of teeth and supporting structures: Secondary | ICD-10-CM

## 2024-01-23 MED ORDER — CLINDAMYCIN HCL 300 MG PO CAPS: 300.0000 mg | ORAL_CAPSULE | Freq: Three times a day (TID) | ORAL | 0 refills | Status: AC

## 2024-01-23 NOTE — Addendum Note (Signed)
 Addended by: Georgana Curio on: 01/23/2024 11:44 AM   Modules accepted: Orders

## 2024-01-23 NOTE — Progress Notes (Signed)
 E-Visit for Dental Pain  We are sorry that you are not feeling well.  Here is how we plan to help!  Based on what you have shared with me in the questionnaire, it sounds like you have pain due to extraction, not necessarily an infection.   I recommend that you call your dentist ASAP to determine the cause of the dental pain and be sure it is adequately treated  A toothache or tooth pain is caused when the nerve in the root of a tooth or surrounding a tooth is irritated. Dental (tooth) infection, decay, injury, or loss of a tooth are the most common causes of dental pain. Pain may also occur after an extraction (tooth is pulled out). Pain sometimes originates from other areas and radiates to the jaw, thus appearing to be tooth pain.Bacteria growing inside your mouth can contribute to gum disease and dental decay, both of which can cause pain. A toothache occurs from inflammation of the central portion of the tooth called pulp. The pulp contains nerve endings that are very sensitive to pain. Inflammation to the pulp or pulpitis may be caused by dental cavities, trauma, and infection.    HOME CARE:   For toothaches: Over-the-counter pain medications such as acetaminophen or ibuprofen may be used. Take these as directed on the package while you arrange for a dental appointment. Avoid very cold or hot foods, because they may make the pain worse. You may get relief from biting on a cotton ball soaked in oil of cloves. You can get oil of cloves at most drug stores.  For jaw pain:  Aspirin may be helpful for problems in the joint of the jaw in adults. If pain happens every time you open your mouth widely, the temporomandibular joint (TMJ) may be the source of the pain. Yawning or taking a large bite of food may worsen the pain. An appointment with your doctor or dentist will help you find the cause.     GET HELP RIGHT AWAY IF:  You have a high fever or chills If you have had a recent head or face  injury and develop headache, light headedness, nausea, vomiting, or other symptoms that concern you after an injury to your face or mouth, you could have a more serious injury in addition to your dental injury. A facial rash associated with a toothache: This condition may improve with medication. Contact your doctor for them to decide what is appropriate. Any jaw pain occurring with chest pain: Although jaw pain is most commonly caused by dental disease, it is sometimes referred pain from other areas. People with heart disease, especially people who have had stents placed, people with diabetes, or those who have had heart surgery may have jaw pain as a symptom of heart attack or angina. If your jaw or tooth pain is associated with lightheadedness, sweating, or shortness of breath, you should see a doctor as soon as possible. Trouble swallowing or excessive pain or bleeding from gums: If you have a history of a weakened immune system, diabetes, or steroid use, you may be more susceptible to infections. Infections can often be more severe and extensive or caused by unusual organisms. Dental and gum infections in people with these conditions may require more aggressive treatment. An abscess may need draining or IV antibiotics, for example.  MAKE SURE YOU   Understand these instructions. Will watch your condition. Will get help right away if you are not doing well or get worse.  Thank you  for choosing an e-visit.  Your e-visit answers were reviewed by a board certified advanced clinical practitioner to complete your personal care plan. Depending upon the condition, your plan could have included both over the counter or prescription medications.  Please review your pharmacy choice. Make sure the pharmacy is open so you can pick up prescription now. If there is a problem, you may contact your provider through [employer] and have the prescription routed to another pharmacy.  Your safety is important  to [place]. If you have drug allergies check your prescription carefully.   For the next 24 hours you can use MyChart to ask questions about today's visit, request a non-urgent call back, or ask for a work or school excuse. You will get an email in the next two days asking about your experience. I hope that your e-visit has been valuable and will speed your recovery.  I have spent 5 minutes in review of e-visit questionnaire, review and updating patient chart, medical decision making and response to patient.   Kasandra Knudsen Mayers, PA-C

## 2024-01-23 NOTE — Progress Notes (Signed)

## 2024-01-28 ENCOUNTER — Ambulatory Visit: Payer: BC Managed Care – PPO | Admitting: Nurse Practitioner

## 2024-02-08 IMAGING — DX DG LUMBAR SPINE COMPLETE 4+V
4 series · 4 of 4 positions shown · non-contrast
Comparison: None.

CLINICAL DATA: Acute on chronic low back pain with right-sided
sciatica x 3 weeks.

EXAM:
LUMBAR SPINE - COMPLETE 4+ VIEW

[lumbar spine ap]
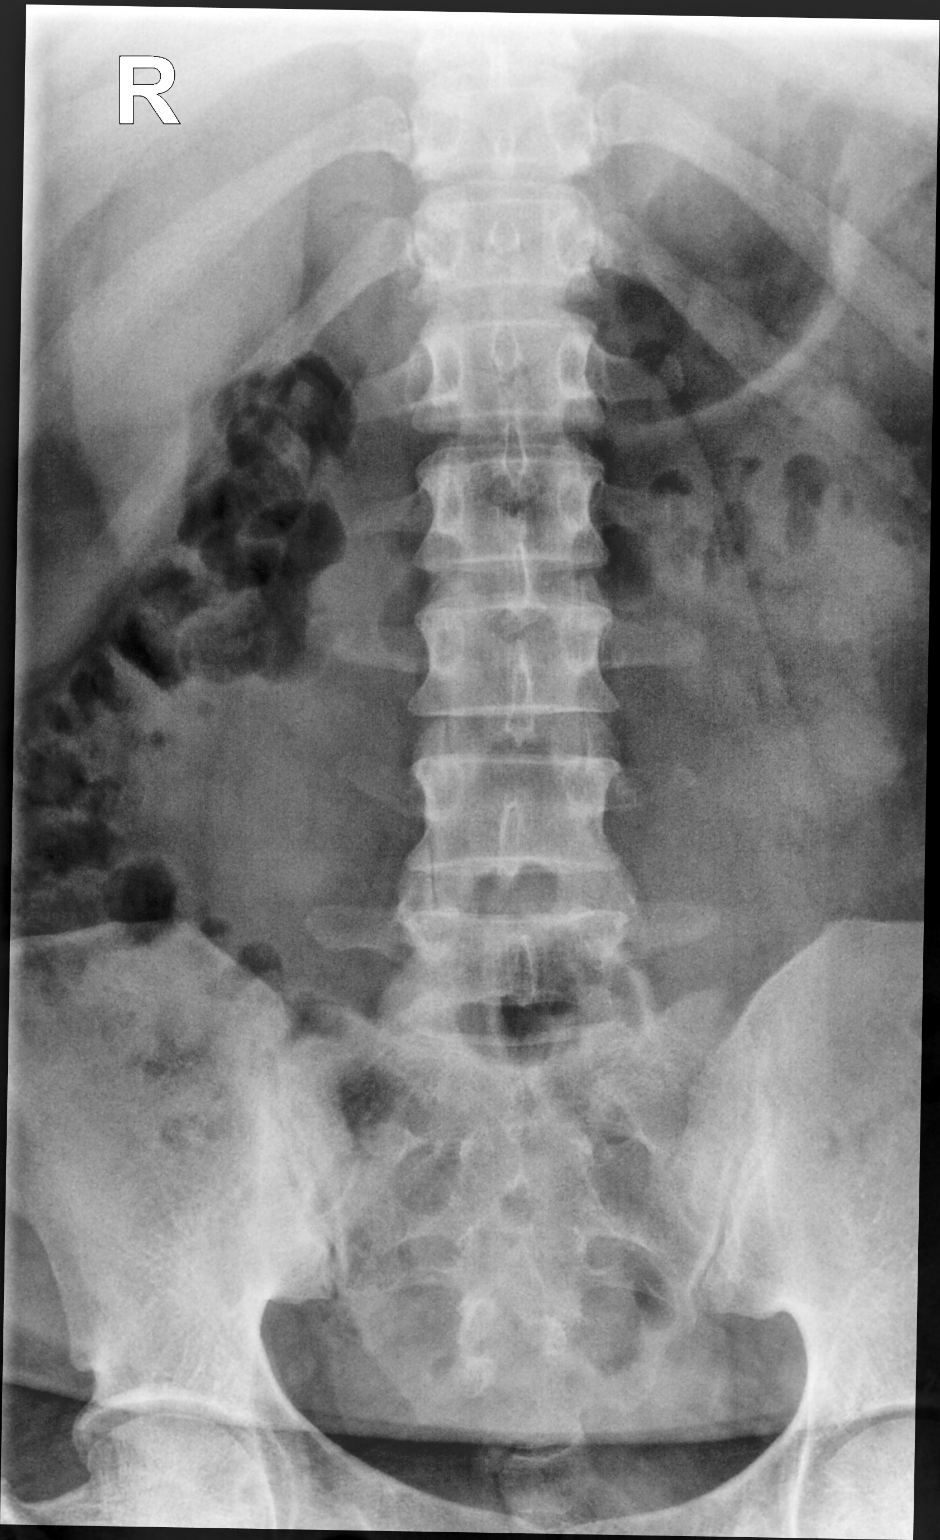

[lumbar spine lmo (1 of 2)]
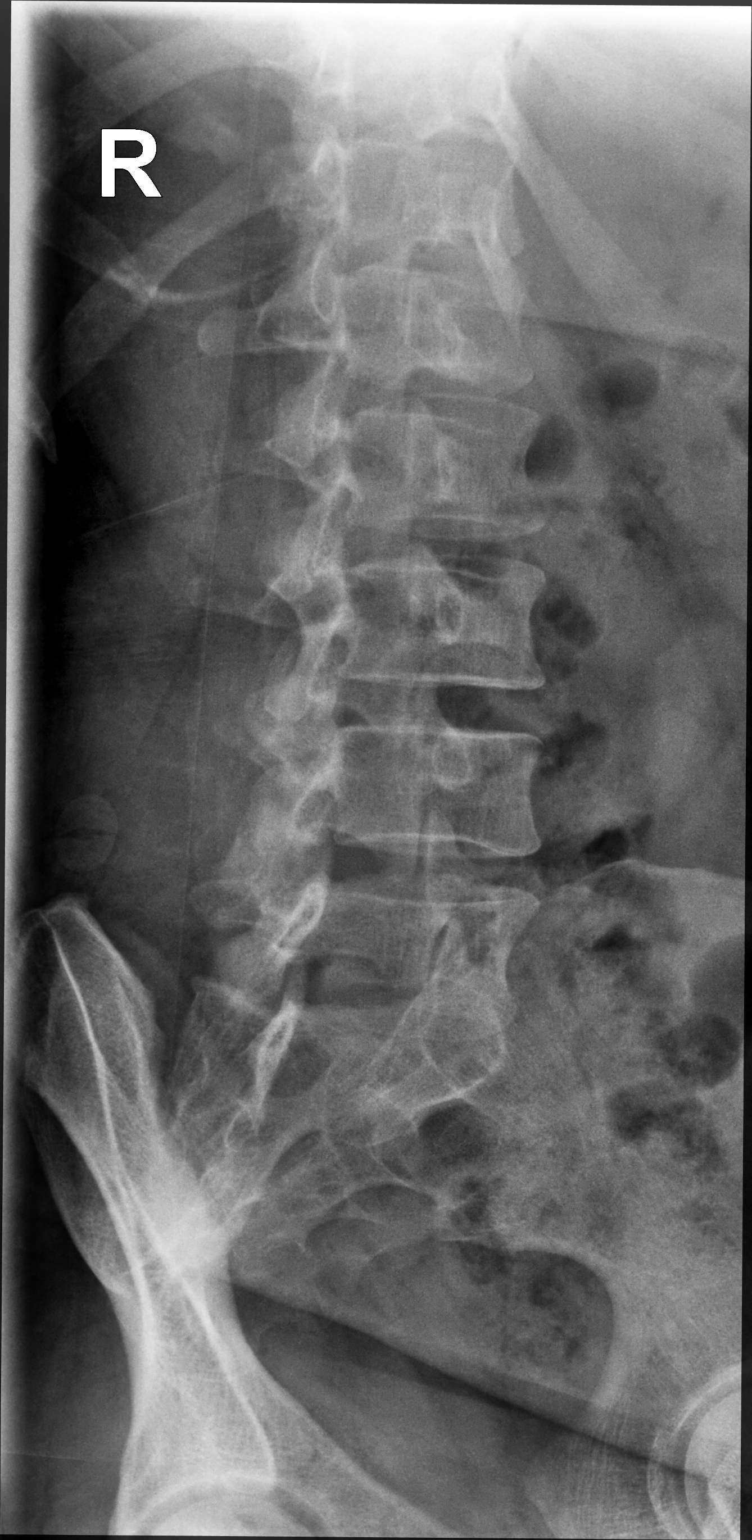

[lumbar spine lmo (2 of 2)]
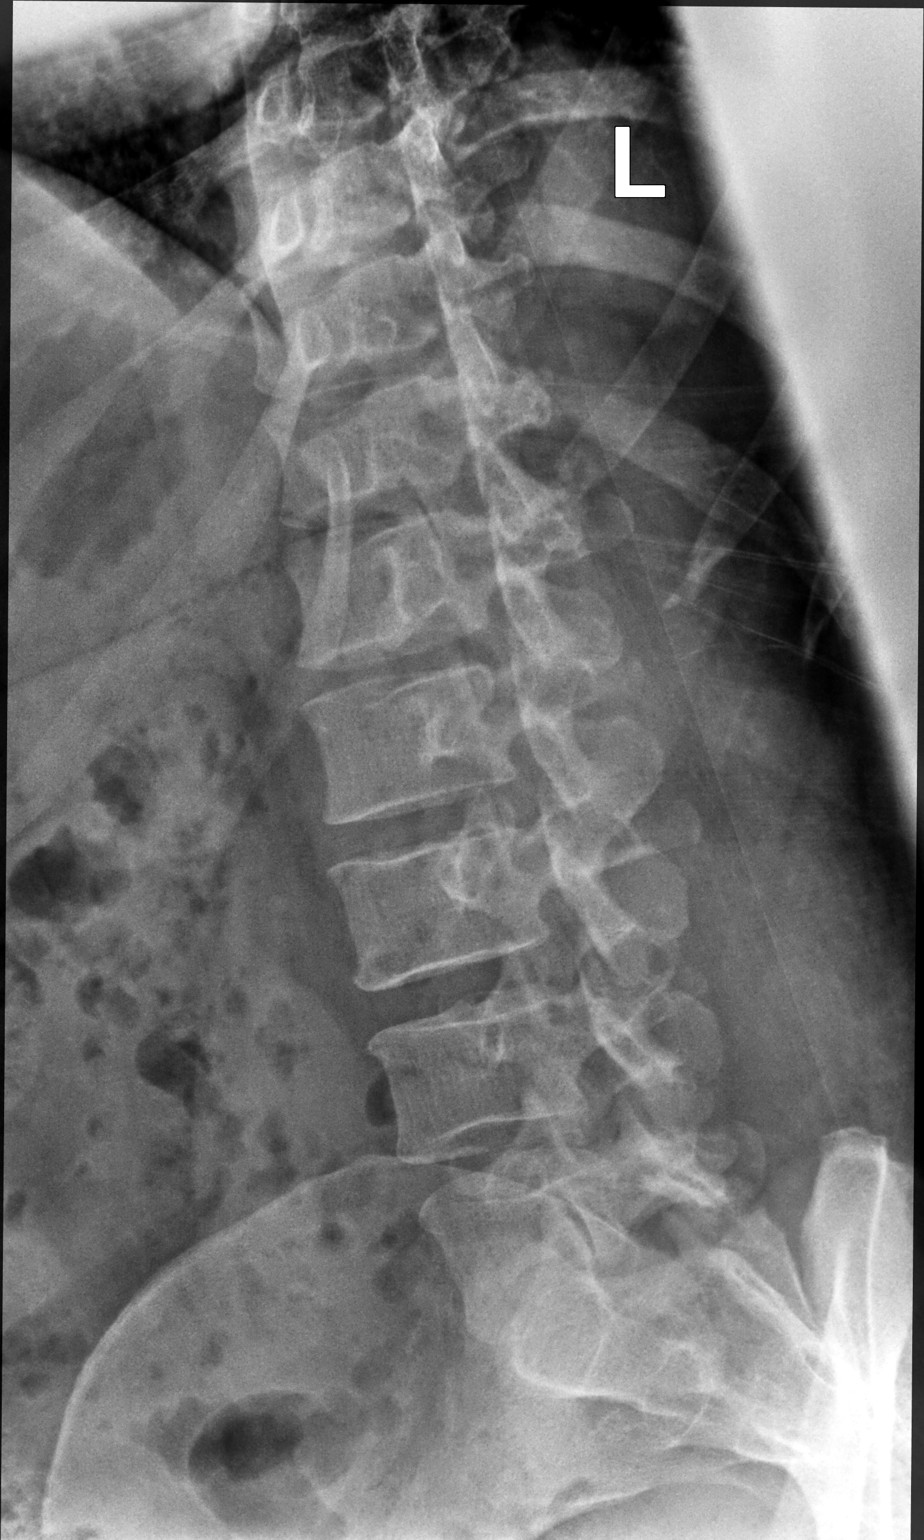

[lumbar spine lat]
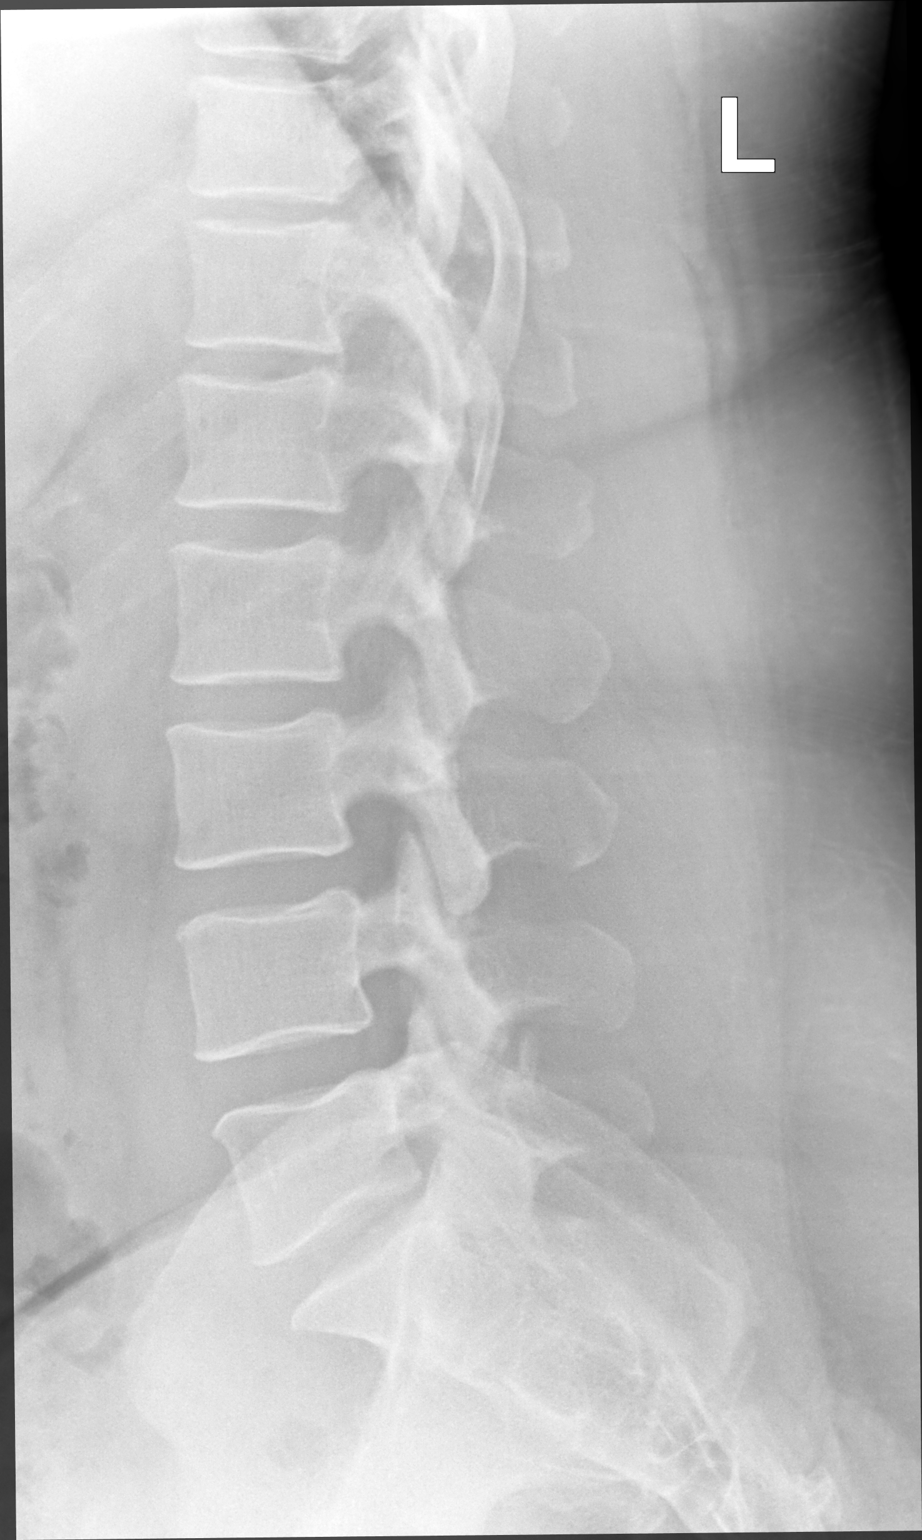

[4 of 4 positions shown; findings below may reference images not displayed]

FINDINGS: There are 5 non-rib-bearing lumbar vertebra. Slight straightening of
normal lordosis. Otherwise normal alignment. Normal vertebral body
heights. Normal intervertebral disc spaces. No fracture, focal bone
lesion, bony destruction or evident pars defects. The sacroiliac
joints are congruent and normal.
IMPRESSION: Slight straightening of normal lordosis, can be seen with muscle
spasm. Otherwise negative.

## 2024-02-09 ENCOUNTER — Encounter: Payer: Self-pay | Admitting: Nurse Practitioner

## 2024-02-09 ENCOUNTER — Ambulatory Visit (INDEPENDENT_AMBULATORY_CARE_PROVIDER_SITE_OTHER): Admitting: Nurse Practitioner

## 2024-02-09 VITALS — BP 124/84 | HR 98 | Temp 97.6°F | Ht 68.0 in | Wt 220.2 lb

## 2024-02-09 DIAGNOSIS — E66811 Obesity, class 1: Secondary | ICD-10-CM

## 2024-02-09 DIAGNOSIS — R7303 Prediabetes: Secondary | ICD-10-CM

## 2024-02-09 DIAGNOSIS — E6609 Other obesity due to excess calories: Secondary | ICD-10-CM

## 2024-02-09 DIAGNOSIS — I10 Essential (primary) hypertension: Secondary | ICD-10-CM

## 2024-02-09 DIAGNOSIS — Z6833 Body mass index (BMI) 33.0-33.9, adult: Secondary | ICD-10-CM

## 2024-02-09 DIAGNOSIS — E782 Mixed hyperlipidemia: Secondary | ICD-10-CM | POA: Diagnosis not present

## 2024-02-09 MED ORDER — TIRZEPATIDE-WEIGHT MANAGEMENT 7.5 MG/0.5ML ~~LOC~~ SOLN: 7.5000 mg | SUBCUTANEOUS | 1 refills | Status: DC

## 2024-02-09 NOTE — Patient Instructions (Signed)
 Use fresh food to manage sweet craving. Maintain of moderate intensity exercise weekly. Maintain low carb, high fiber, low fat, high protein diet

## 2024-02-09 NOTE — Progress Notes (Signed)
 Established Patient Visit  Patient: Jessica Greene   DOB: 12/07/1985   38 y.o. Female  MRN: [id number] Visit Date: 02/09/2024  Subjective:    Chief Complaint  Patient presents with   Weight Check    4 week follow up weight check. Pt states she is doing well. Now having normal bowel movements since deceased in Zepbound. No other questions or concerns.    HPI Obesity No weight loss noted 1lbs weight gain Exercise: not consistent Diet: low fat and low carb, struggles with sweet craving Mild heartburn on first 2days after administration of zepbound 5mg . No constipation or nausea or ABDOMEN pain BP Readings from Last 3 Encounters:  02/09/24 124/84  12/30/23 108/80  12/15/23 124/82    Wt Readings from Last 3 Encounters:  02/09/24 220 lb 3.2 oz (99.9 kg)  12/30/23 219 lb 3.2 oz (99.4 kg)  12/15/23 215 lb 12.8 oz (97.9 kg)    Increased zepbound to 7.5mg  weekly Advised to maintain high lean protein, high fiber and low carb diet, eat fresh fruit to manage sweet cravings, maintain of exercise weekly, and take miralax daily to prevent constipation. F/up in 2months  Reviewed medical, surgical, and social history today  Medications: Outpatient Medications Prior to Visit  Medication Sig   albuterol (VENTOLIN HFA) 108 (90 Base) MCG/ACT inhaler Inhale 2 puffs into the lungs every 6 (six) hours as needed for wheezing or shortness of breath.   omeprazole (PRILOSEC) 20 MG capsule Take 1 capsule (20 mg total) by mouth in the morning.   potassium chloride SA (KLOR-CON M) 20 MEQ tablet Take 1 tablet (20 mEq total) by mouth 2 (two) times daily.   pravastatin (PRAVACHOL) 20 MG tablet TAKE 1 TABLET BY MOUTH AT BEDTIME   triamcinolone cream (KENALOG) 0.1 % Apply 1 Application topically 2 (two) times daily.   triamterene-hydrochlorothiazide (MAXZIDE-25) 37.5-25 MG tablet Take 0.5 tablets by mouth daily.   [DISCONTINUED] tirzepatide 5 MG/0.5ML injection vial Inject 5 mg  into the skin once a week.   No facility-administered medications prior to visit.   Reviewed past medical and social history.   ROS per HPI above  Last metabolic panel Lab Results  Component Value Date   GLUCOSE 84 11/12/2023   NA 140 11/12/2023   K 3.1 (L) 11/12/2023   CL 101 11/12/2023   CO2 30 11/12/2023   BUN 9 11/12/2023   CREATININE 0.54 11/12/2023   GFR 116.76 11/12/2023   CALCIUM 9.2 11/12/2023   PROT 7.0 11/12/2023   ALBUMIN 4.2 11/12/2023   BILITOT 0.4 11/12/2023   ALKPHOS 67 11/12/2023   AST 15 11/12/2023   ALT 16 11/12/2023   ANIONGAP 13 02/12/2023   Last lipids Lab Results  Component Value Date   CHOL 169 06/29/2023   HDL 34.60 (L) 06/29/2023   LDLCALC 113 (H) 06/29/2023   LDLDIRECT 127.0 06/17/2022   TRIG 107.0 06/29/2023   CHOLHDL 5 06/29/2023   Last hemoglobin A1c Lab Results  Component Value Date   HGBA1C 5.9 11/12/2023        Objective:  BP 124/84 (BP Location: Right Arm, Patient Position: Sitting, Cuff Size: Large)   Pulse 98   Temp 97.6 F (36.4 C)   Ht 5\' 8"  (1.727 m)   Wt 220 lb 3.2 oz (99.9 kg)   SpO2 98%   BMI 33.48 kg/m      Physical Exam Vitals and nursing note reviewed.  Cardiovascular:  Rate and Rhythm: Normal rate.     Pulses: Normal pulses.  Pulmonary:     Effort: Pulmonary effort is normal.  Abdominal:     General: There is no distension.     Tenderness: There is no abdominal tenderness.  Neurological:     Mental Status: She is alert and oriented to person, place, and time.     No results found for any visits on 02/09/24.    Assessment & Plan:    Problem List Items Addressed This Visit     HTN (hypertension)   Relevant Medications   tirzepatide 7.5 MG/0.5ML injection vial   Mixed hyperlipidemia   Relevant Medications   tirzepatide 7.5 MG/0.5ML injection vial   Obesity - Primary   No weight loss noted 1lbs weight gain Exercise: not consistent Diet: low fat and low carb, struggles with sweet  craving Mild heartburn on first 2days after administration of zepbound 5mg . No constipation or nausea or ABDOMEN pain BP Readings from Last 3 Encounters:  02/09/24 124/84  12/30/23 108/80  12/15/23 124/82    Wt Readings from Last 3 Encounters:  02/09/24 220 lb 3.2 oz (99.9 kg)  12/30/23 219 lb 3.2 oz (99.4 kg)  12/15/23 215 lb 12.8 oz (97.9 kg)    Increased zepbound to 7.5mg  weekly Advised to maintain high lean protein, high fiber and low carb diet, eat fresh fruit to manage sweet cravings, maintain of exercise weekly, and take miralax daily to prevent constipation. F/up in 2months      Relevant Medications   tirzepatide 7.5 MG/0.5ML injection vial   Prediabetes   Relevant Medications   tirzepatide 7.5 MG/0.5ML injection vial   Return in about 2 months (around 04/10/2024) for Weight management, HTN, hyperlipidemia (fasting).     Alysia Penna, NP

## 2024-02-09 NOTE — Assessment & Plan Note (Addendum)
 No weight loss noted 1lbs weight gain Exercise: not consistent Diet: low fat and low carb, struggles with sweet craving Mild heartburn on first 2days after administration of zepbound 5mg . No constipation or nausea or ABDOMEN pain BP Readings from Last 3 Encounters:  02/09/24 124/84  12/30/23 108/80  12/15/23 124/82    Wt Readings from Last 3 Encounters:  02/09/24 220 lb 3.2 oz (99.9 kg)  12/30/23 219 lb 3.2 oz (99.4 kg)  12/15/23 215 lb 12.8 oz (97.9 kg)    Increased zepbound to 7.5mg  weekly Advised to maintain high lean protein, high fiber and low carb diet, eat fresh fruit to manage sweet cravings, maintain of exercise weekly, and take miralax daily to prevent constipation. F/up in 2months

## 2024-02-15 ENCOUNTER — Encounter (HOSPITAL_COMMUNITY): Payer: Self-pay | Admitting: Internal Medicine

## 2024-02-15 ENCOUNTER — Ambulatory Visit (HOSPITAL_COMMUNITY)
Admission: RE | Admit: 2024-02-15 | Payer: BC Managed Care – PPO | Source: Ambulatory Visit | Attending: Internal Medicine | Admitting: Internal Medicine

## 2024-02-16 ENCOUNTER — Ambulatory Visit (HOSPITAL_COMMUNITY): Attending: Internal Medicine

## 2024-02-16 DIAGNOSIS — R079 Chest pain, unspecified: Secondary | ICD-10-CM | POA: Diagnosis not present

## 2024-02-16 LAB — ECHOCARDIOGRAM COMPLETE
Area-P 1/2: 2.93 cm2
S' Lateral: 4.1 cm

## 2024-02-18 ENCOUNTER — Encounter: Payer: Self-pay | Admitting: Internal Medicine

## 2024-02-18 ENCOUNTER — Encounter: Payer: Self-pay | Admitting: Nurse Practitioner

## 2024-02-25 ENCOUNTER — Other Ambulatory Visit (HOSPITAL_COMMUNITY): Payer: Self-pay

## 2024-02-27 ENCOUNTER — Other Ambulatory Visit: Payer: Self-pay | Admitting: Nurse Practitioner

## 2024-02-27 DIAGNOSIS — E782 Mixed hyperlipidemia: Secondary | ICD-10-CM

## 2024-02-28 ENCOUNTER — Other Ambulatory Visit: Payer: Self-pay | Admitting: Nurse Practitioner

## 2024-02-28 DIAGNOSIS — I10 Essential (primary) hypertension: Secondary | ICD-10-CM

## 2024-02-29 ENCOUNTER — Other Ambulatory Visit (HOSPITAL_COMMUNITY): Payer: Self-pay

## 2024-02-29 NOTE — Telephone Encounter (Signed)
 Medication: Pravastatin (Pravachol) 20 mg  Directions: Take 1 tablet by mouth at bedtime Last given: 11/15/23 Number refills: 0 Last o/v: 02/09/24 Follow up: 2 months (around 04/10/24), 04/10/24 (scheduled)  Labs: 11/12/23

## 2024-02-29 NOTE — Telephone Encounter (Signed)
 Called patient's pharmacy and spoke with Kathlene November. I informed him of my call. Kathlene November stated that the Rx for the generic did not specify if it was for Zepbound or Mounjaro, so patient was given Mounjaro instead. I asked if a new Rx was needed, if I needed to give a verbal he stated no that he will fill it for the Zepbound (tirzepatide). I thanked him for taking my call and if they have any other questions in the future to please give our office a call.

## 2024-02-29 NOTE — Telephone Encounter (Signed)
 Medication: Triamterene-hydrochlorothiazide 37.5-25 mg Directions:take half tablet by mouth daily  Last given: 06/29/23 Number refills: 1 Last o/v: 02/09/24 Follow up: 2 months-04/10/24 (scheduled) Labs: 11/12/23

## 2024-03-04 DIAGNOSIS — Z419 Encounter for procedure for purposes other than remedying health state, unspecified: Secondary | ICD-10-CM | POA: Diagnosis not present

## 2024-04-03 ENCOUNTER — Encounter (HOSPITAL_COMMUNITY): Payer: Self-pay

## 2024-04-03 DIAGNOSIS — Z419 Encounter for procedure for purposes other than remedying health state, unspecified: Secondary | ICD-10-CM | POA: Diagnosis not present

## 2024-04-10 ENCOUNTER — Ambulatory Visit: Admitting: Nurse Practitioner

## 2024-04-18 ENCOUNTER — Other Ambulatory Visit (HOSPITAL_COMMUNITY)
Admission: RE | Admit: 2024-04-18 | Discharge: 2024-04-18 | Disposition: A | Discharge status: Home / Self Care | Source: Ambulatory Visit | Attending: Nurse Practitioner | Admitting: Nurse Practitioner

## 2024-04-18 ENCOUNTER — Ambulatory Visit (INDEPENDENT_AMBULATORY_CARE_PROVIDER_SITE_OTHER): Admitting: Nurse Practitioner

## 2024-04-18 ENCOUNTER — Encounter: Payer: Self-pay | Admitting: Nurse Practitioner

## 2024-04-18 VITALS — BP 120/82 | HR 83 | Temp 97.1°F | Ht 67.0 in | Wt 219.2 lb

## 2024-04-18 DIAGNOSIS — E66811 Obesity, class 1: Secondary | ICD-10-CM | POA: Diagnosis not present

## 2024-04-18 DIAGNOSIS — K219 Gastro-esophageal reflux disease without esophagitis: Secondary | ICD-10-CM

## 2024-04-18 DIAGNOSIS — E559 Vitamin D deficiency, unspecified: Secondary | ICD-10-CM

## 2024-04-18 DIAGNOSIS — E782 Mixed hyperlipidemia: Secondary | ICD-10-CM

## 2024-04-18 DIAGNOSIS — N921 Excessive and frequent menstruation with irregular cycle: Secondary | ICD-10-CM

## 2024-04-18 DIAGNOSIS — Z1159 Encounter for screening for other viral diseases: Secondary | ICD-10-CM | POA: Insufficient documentation

## 2024-04-18 DIAGNOSIS — R7303 Prediabetes: Secondary | ICD-10-CM | POA: Diagnosis not present

## 2024-04-18 DIAGNOSIS — E6609 Other obesity due to excess calories: Secondary | ICD-10-CM | POA: Diagnosis not present

## 2024-04-18 DIAGNOSIS — Z6834 Body mass index (BMI) 34.0-34.9, adult: Secondary | ICD-10-CM | POA: Diagnosis not present

## 2024-04-18 DIAGNOSIS — I10 Essential (primary) hypertension: Secondary | ICD-10-CM

## 2024-04-18 LAB — LIPID PANEL
Cholesterol: 152 mg/dL (ref 0–200)
HDL: 31.1 mg/dL — ABNORMAL LOW (ref >39.00)
LDL Cholesterol: 94 mg/dL (ref 0–99)
NonHDL: 120.56
Total CHOL/HDL Ratio: 5
Triglycerides: 135 mg/dL (ref 0.0–149.0)
VLDL: 27 mg/dL (ref 0.0–40.0)

## 2024-04-18 LAB — BASIC METABOLIC PANEL WITH GFR
BUN: 11 mg/dL (ref 6–23)
CO2: 26 meq/L (ref 19–32)
Calcium: 9 mg/dL (ref 8.4–10.5)
Chloride: 105 meq/L (ref 96–112)
Creatinine, Ser: 0.56 mg/dL (ref 0.40–1.20)
GFR: 115.39 mL/min (ref >60.00)
Glucose, Bld: 90 mg/dL (ref 70–99)
Potassium: 3.2 meq/L — ABNORMAL LOW (ref 3.5–5.1)
Sodium: 140 meq/L (ref 135–145)

## 2024-04-18 LAB — VITAMIN D 25 HYDROXY (VIT D DEFICIENCY, FRACTURES): VITD: 13.34 ng/mL — ABNORMAL LOW (ref 30.00–100.00)

## 2024-04-18 LAB — HEMOGLOBIN A1C: Hgb A1c MFr Bld: 5.9 % (ref 4.6–6.5)

## 2024-04-18 MED ORDER — OMEPRAZOLE 20 MG PO CPDR: 20.0000 mg | DELAYED_RELEASE_CAPSULE | Freq: Every morning | ORAL | 0 refills | Status: DC

## 2024-04-18 MED ORDER — ZEPBOUND 10 MG/0.5ML ~~LOC~~ SOLN: 10.0000 mg | SUBCUTANEOUS | 0 refills | Status: DC

## 2024-04-18 NOTE — Assessment & Plan Note (Signed)
 No weight loss noted today with zepbound  7.5mg  weekly Exercise: walking daily Diet: decreased portions. Wt Readings from Last 3 Encounters:  04/18/24 219 lb 3.2 oz (99.4 kg)  02/09/24 220 lb 3.2 oz (99.9 kg)  12/30/23 219 lb 3.2 oz (99.4 kg)    With acute viral gastroenteritis, advised to hold zepbound  dose this week. Resume 7.5mg  dose next week, then increased to 10mg  weekly F/up in 61month

## 2024-04-18 NOTE — Assessment & Plan Note (Addendum)
 Advised to schedule f/up appointment with GYN She requested for STD screen today

## 2024-04-18 NOTE — Assessment & Plan Note (Signed)
 Repeat lipid panel Maintain pravastatin dose

## 2024-04-18 NOTE — Patient Instructions (Addendum)
 Go to lab Use peptobosmol for upset stomach and diarrhea. Use mucinex Dm for chest congestion Schedule appointment with GYN  Diarrhea, Adult Diarrhea is when you pass loose and sometimes watery poop (stool) often. Diarrhea can make you feel weak and cause you to lose water in your body (get dehydrated). Losing water in your body can cause you to: Feel tired and thirsty. Have a dry mouth. Go pee (urinate) less often. Diarrhea often lasts 2-3 days. It can last longer if it is a sign of something more serious. Be sure to treat your diarrhea as told by your doctor. Follow these instructions at home: Eating and drinking     Follow these instructions as told by your doctor: Take an ORS (oral rehydration solution). This is a drink that helps you replace fluids and minerals your body lost. It is sold at pharmacies and stores. Drink enough fluid to keep your pee (urine) pale yellow. Drink fluids such as: Water. You can also get fluids by sucking on ice chips. Diluted fruit juice. Low-calorie sports drinks. Milk. Avoid drinking fluids that have a lot of sugar or caffeine in them. These include soda, energy drinks, and regular sports drinks. Avoid alcohol. Eat bland, easy-to-digest foods in small amounts as you are able. These foods include: Bananas. Applesauce. Rice. Low-fat (lean) meats. Toast. Crackers. Avoid spicy or fatty foods.  Medicines Take over-the-counter and prescription medicines only as told by your doctor. If you were prescribed antibiotics, take them as told by your doctor. Do not stop taking them even if you start to feel better. General instructions  Wash your hands often using soap and water for 20 seconds. If soap and water are not available, use hand sanitizer. Others in your home should wash their hands as well. Wash your hands: After using the toilet or changing a diaper. Before preparing, cooking, or serving food. While caring for a sick person. While visiting  someone in a hospital. Rest at home while you get better. Take a warm bath to help with any burning or pain from having diarrhea. Watch your condition for any changes. Contact a doctor if: You have a fever. Your diarrhea gets worse. You have new symptoms. You vomit every time you eat or drink. You feel light-headed, dizzy, or you have a headache. You have muscle cramps. You have signs of losing too much water in your body, such as: Dark pee, very little pee, or no pee. Cracked lips. Dry mouth. Sunken eyes. Sleepiness. Weakness. You have bloody or black poop or poop that looks like tar. You have very bad pain, cramping, or bloating in your belly (abdomen). Your skin feels cold and clammy. You feel confused. Get help right away if: You have chest pain. Your heart is beating very quickly. You have trouble breathing or you are breathing very quickly. You feel very weak or you faint. These symptoms may be an emergency. Get help right away. Call 911. Do not wait to see if the symptoms will go away. Do not drive yourself to the hospital. This information is not intended to replace advice given to you by your health care provider. Make sure you discuss any questions you have with your health care provider. Document Revised: 04/28/2022 Document Reviewed: 04/28/2022 Elsevier Patient Education  2024 [employer].

## 2024-04-18 NOTE — Progress Notes (Signed)
 Established Patient Visit  Patient: Jessica Greene   DOB: 1986-01-26   38 y.o. Female  MRN: [id number] Visit Date: 04/18/2024  Subjective:     Chief Complaint  Patient presents with   Follow-up    Has been spotting for two weeks    knot on foot    Has a knot on bottom of left foot has been there for a while    URI  This is a new problem. The current episode started in the past 7 days. The problem has been unchanged. There has been no fever. Associated symptoms include abdominal pain, congestion, coughing and nausea. Pertinent negatives include no chest pain, diarrhea, dysuria, ear pain, headaches, joint pain, joint swelling, neck pain, plugged ear sensation, rash, rhinorrhea, sinus pain, sneezing, sore throat, swollen glands, vomiting or wheezing. Associated symptoms comments: diarrhea. She has tried nothing for the symptoms.   GERD (gastroesophageal reflux disease) Worse with use of zepbound . Start omeprazole  20mg  in AM or at hs  Menorrhagia with irregular cycle Advised to schedule f/up appointment with GYN She requested for STD screen today  Obesity No weight loss noted today with zepbound  7.5mg  weekly Exercise: walking daily Diet: decreased portions. Wt Readings from Last 3 Encounters:  04/18/24 219 lb 3.2 oz (99.4 kg)  02/09/24 220 lb 3.2 oz (99.9 kg)  12/30/23 219 lb 3.2 oz (99.4 kg)    With acute viral gastroenteritis, advised to hold zepbound  dose this week. Resume 7.5mg  dose next week, then increased to 10mg  weekly F/up in 45month  Mixed hyperlipidemia Repeat lipid panel Maintain pravastatin  dose  HTN (hypertension) BP at goal with maxzide BP Readings from Last 3 Encounters:  04/18/24 120/82  02/09/24 124/84  12/30/23 108/80    Repeat BMP Maintain med dose  Wt Readings from Last 3 Encounters:  04/18/24 219 lb 3.2 oz (99.4 kg)  02/09/24 220 lb 3.2 oz (99.9 kg)  12/30/23 219 lb 3.2 oz (99.4 kg)    Reviewed medical, surgical, and  social history today  Medications: Outpatient Medications Prior to Visit  Medication Sig   pravastatin  (PRAVACHOL ) 20 MG tablet TAKE 1 TABLET BY MOUTH AT BEDTIME   triamcinolone  cream (KENALOG ) 0.1 % Apply 1 Application topically 2 (two) times daily.   triamterene -hydrochlorothiazide  (MAXZIDE-25) 37.5-25 MG tablet Take 1/2 (one-half) tablet by mouth once daily   [DISCONTINUED] tirzepatide  7.5 MG/0.5ML injection vial Inject 7.5 mg into the skin once a week.   [DISCONTINUED] albuterol  (VENTOLIN  HFA) 108 (90 Base) MCG/ACT inhaler Inhale 2 puffs into the lungs every 6 (six) hours as needed for wheezing or shortness of breath. (Patient not taking: Reported on 04/18/2024)   [DISCONTINUED] omeprazole  (PRILOSEC) 20 MG capsule Take 1 capsule (20 mg total) by mouth in the morning. (Patient not taking: Reported on 04/18/2024)   [DISCONTINUED] potassium chloride  SA (KLOR-CON  M) 20 MEQ tablet Take 1 tablet (20 mEq total) by mouth 2 (two) times daily. (Patient not taking: Reported on 04/18/2024)   No facility-administered medications prior to visit.   Reviewed past medical and social history.   ROS per HPI above      Objective:  BP 120/82 (BP Location: Right Arm, Patient Position: Sitting, Cuff Size: Normal)   Pulse 83   Temp (!) 97.1 F (36.2 C) (Temporal)   Ht 5\' 7"  (1.702 m)   Wt 219 lb 3.2 oz (99.4 kg)   SpO2 99%   BMI 34.33 kg/m  Physical Exam Vitals and nursing note reviewed.  Cardiovascular:     Rate and Rhythm: Normal rate and regular rhythm.     Pulses: Normal pulses.     Heart sounds: Normal heart sounds.  Pulmonary:     Effort: Pulmonary effort is normal.     Breath sounds: Normal breath sounds.  Abdominal:     General: Bowel sounds are normal. There is no distension.     Palpations: Abdomen is soft.     Tenderness: There is no guarding.  Musculoskeletal:     Right lower leg: No edema.     Left lower leg: No edema.  Neurological:     Mental Status: She is alert and  oriented to person, place, and time.     No results found for any visits on 04/18/24.    Assessment & Plan:    Problem List Items Addressed This Visit     GERD (gastroesophageal reflux disease)   Worse with use of zepbound . Start omeprazole  20mg  in AM or at hs      Relevant Medications   omeprazole  (PRILOSEC) 20 MG capsule   HTN (hypertension)   BP at goal with maxzide BP Readings from Last 3 Encounters:  04/18/24 120/82  02/09/24 124/84  12/30/23 108/80    Repeat BMP Maintain med dose      Menorrhagia with irregular cycle   Advised to schedule f/up appointment with GYN She requested for STD screen today      Mixed hyperlipidemia   Repeat lipid panel Maintain pravastatin  dose      Relevant Orders   Lipid panel   Obesity - Primary   No weight loss noted today with zepbound  7.5mg  weekly Exercise: walking daily Diet: decreased portions. Wt Readings from Last 3 Encounters:  04/18/24 219 lb 3.2 oz (99.4 kg)  02/09/24 220 lb 3.2 oz (99.9 kg)  12/30/23 219 lb 3.2 oz (99.4 kg)    With acute viral gastroenteritis, advised to hold zepbound  dose this week. Resume 7.5mg  dose next week, then increased to 10mg  weekly F/up in 61month      Relevant Medications   ZEPBOUND  10 MG/0.5ML injection vial   Other Relevant Orders   Basic metabolic panel with GFR   Prediabetes   Relevant Orders   Hemoglobin A1c   Basic metabolic panel with GFR   Other Visit Diagnoses       Vitamin D  deficiency       Relevant Orders   VITAMIN D  25 Hydroxy (Vit-D Deficiency, Fractures)     Encounter for screening for viral disease       Relevant Orders   Cervicovaginal ancillary only   RPR   HIV Antibody (routine testing w rflx)      Return in about 4 weeks (around 05/16/2024) for Weight management.     Kathrene Parents, NP

## 2024-04-18 NOTE — Assessment & Plan Note (Signed)
>>  ASSESSMENT AND PLAN FOR MENORRHAGIA WITH IRREGULAR CYCLE WRITTEN ON 04/18/2024 12:29 PM BY Ayaan Shutes LUM, NP  Advised to schedule f/up appointment with GYN She requested for STD screen today

## 2024-04-18 NOTE — Assessment & Plan Note (Addendum)
 BP at goal with maxzide BP Readings from Last 3 Encounters:  04/18/24 120/82  02/09/24 124/84  12/30/23 108/80    Repeat BMP Maintain med dose

## 2024-04-18 NOTE — Assessment & Plan Note (Signed)
 Worse with use of zepbound . Start omeprazole  20mg  in AM or at hs

## 2024-04-19 ENCOUNTER — Ambulatory Visit: Payer: Self-pay | Admitting: Nurse Practitioner

## 2024-04-19 DIAGNOSIS — B9689 Other specified bacterial agents as the cause of diseases classified elsewhere: Secondary | ICD-10-CM

## 2024-04-19 DIAGNOSIS — E559 Vitamin D deficiency, unspecified: Secondary | ICD-10-CM

## 2024-04-19 DIAGNOSIS — A5901 Trichomonal vulvovaginitis: Secondary | ICD-10-CM

## 2024-04-19 DIAGNOSIS — N921 Excessive and frequent menstruation with irregular cycle: Secondary | ICD-10-CM

## 2024-04-19 LAB — CERVICOVAGINAL ANCILLARY ONLY
Bacterial Vaginitis (gardnerella): POSITIVE — AB
Candida Glabrata: NEGATIVE
Candida Vaginitis: NEGATIVE
Chlamydia: NEGATIVE
Comment: NEGATIVE
Comment: NEGATIVE
Comment: NEGATIVE
Comment: NEGATIVE
Comment: NEGATIVE
Comment: NORMAL
Neisseria Gonorrhea: NEGATIVE
Trichomonas: POSITIVE — AB

## 2024-04-19 LAB — HIV ANTIBODY (ROUTINE TESTING W REFLEX): HIV 1&2 Ab, 4th Generation: NONREACTIVE

## 2024-04-19 LAB — RPR: RPR Ser Ql: NONREACTIVE

## 2024-04-19 MED ORDER — METRONIDAZOLE 500 MG PO TABS: 500.0000 mg | ORAL_TABLET | Freq: Two times a day (BID) | ORAL | 0 refills | Status: AC

## 2024-04-19 MED ORDER — VITAMIN D (ERGOCALCIFEROL) 1.25 MG (50000 UNIT) PO CAPS: 50000.0000 [IU] | ORAL_CAPSULE | ORAL | 0 refills | Status: DC

## 2024-04-20 ENCOUNTER — Telehealth: Admitting: Physician Assistant

## 2024-04-20 DIAGNOSIS — M791 Myalgia, unspecified site: Secondary | ICD-10-CM

## 2024-04-20 DIAGNOSIS — R6889 Other general symptoms and signs: Secondary | ICD-10-CM | POA: Diagnosis not present

## 2024-04-20 DIAGNOSIS — R509 Fever, unspecified: Secondary | ICD-10-CM

## 2024-04-20 MED ORDER — BENZONATATE 100 MG PO CAPS: 100.0000 mg | ORAL_CAPSULE | Freq: Three times a day (TID) | ORAL | 0 refills | Status: DC | PRN

## 2024-04-20 MED ORDER — OSELTAMIVIR PHOSPHATE 75 MG PO CAPS: 75.0000 mg | ORAL_CAPSULE | Freq: Two times a day (BID) | ORAL | 0 refills | Status: DC

## 2024-04-20 MED ORDER — FLUTICASONE PROPIONATE 50 MCG/ACT NA SUSP: 2.0000 | Freq: Every day | NASAL | 0 refills | Status: DC

## 2024-04-20 NOTE — Progress Notes (Signed)
 E visit for Flu like symptoms   We are sorry that you are not feeling well.  Here is how we plan to help! Based on what you have shared with me it looks like you may have a respiratory virus that may be influenza. I am going to go ahead and start treatment for Influenza since it has to be started quickly. You may want to get an at home Covid + Flu test for yourself to check at home. If you were to test positive for Covid instead, please reach back out for appropriate treatment as the antivirals are different for each.  Influenza or "the flu" is   an infection caused by a respiratory virus. The flu virus is highly contagious and persons who did not receive their yearly flu vaccination may "catch" the flu from close contact.  We have anti-viral medications to treat the viruses that cause this infection. They are not a "cure" and only shorten the course of the infection. These prescriptions are most effective when they are given within the first 2 days of "flu" symptoms. Antiviral medication are indicated if you have a high risk of complications from the flu. You should  also consider an antiviral medication if you are in close contact with someone who is at risk. These medications can help patients avoid complications from the flu  but have side effects that you should know. Possible side effects from Tamiflu  or oseltamivir  include nausea, vomiting, diarrhea, dizziness, headaches, eye redness, sleep problems or other respiratory symptoms. You should not take Tamiflu  if you have an allergy to oseltamivir  or any to the ingredients in Tamiflu .  Based upon your symptoms and potential risk factors I have prescribed Oseltamivir  (Tamiflu ).  It has been sent to your designated pharmacy.  You will take one 75 mg capsule orally twice a day for the next 5 days.  This is an infection that is most likely caused by a virus. There are no specific treatments other than to help you with the symptoms until the infection  runs its course.  We are sorry you are not feeling well.  Here is how we plan to help!  For nasal congestion, you may use an oral decongestants such as Mucinex D or if you have glaucoma or high blood pressure use plain Mucinex.  Saline nasal spray or nasal drops can help and can safely be used as often as needed for congestion.  For your congestion, I have prescribed Fluticasone  nasal spray one spray in each nostril twice a day  If you do not have a history of heart disease, hypertension, diabetes or thyroid  disease, prostate/bladder issues or glaucoma, you may also use Sudafed to treat nasal congestion.  It is highly recommended that you consult with a pharmacist or your primary care physician to ensure this medication is safe for you to take.     If you have a cough, you may use cough suppressants such as Delsym and Robitussin.  If you have glaucoma or high blood pressure, you can also use Coricidin HBP.   For cough I have prescribed for you A prescription cough medication called Tessalon  Perles 100 mg. You may take 1-2 capsules every 8 hours as needed for cough  If you have a sore or scratchy throat, use a saltwater gargle-  to  teaspoon of salt dissolved in a 4-ounce to 8-ounce glass of warm water.  Gargle the solution for approximately 15-30 seconds and then spit.  It is important not to swallow  the solution.  You can also use throat lozenges/cough drops and Chloraseptic spray to help with throat pain or discomfort.  Warm or cold liquids can also be helpful in relieving throat pain.  For headache, pain or general discomfort, you can use Ibuprofen  or Tylenol  as directed.   Some authorities believe that zinc sprays or the use of Echinacea may shorten the course of your symptoms.   ANYONE WHO HAS FLU SYMPTOMS SHOULD: Stay home. The flu is highly contagious and going out or to work exposes others! Be sure to drink plenty of fluids. Water is fine as well as fruit juices, sodas and electrolyte  beverages. You may want to stay away from caffeine or alcohol. If you are nauseated, try taking small sips of liquids. How do you know if you are getting enough fluid? Your urine should be a pale yellow or almost colorless. Get rest. Taking a steamy shower or using a humidifier may help nasal congestion and ease sore throat pain. Using a saline nasal spray works much the same way. Cough drops, hard candies and sore throat lozenges may ease your cough. Line up a caregiver. Have someone check on you regularly.   GET HELP RIGHT AWAY IF: You cannot keep down liquids or your medications. You become short of breath Your fell like you are going to pass out or loose consciousness. Your symptoms persist after you have completed your treatment plan MAKE SURE YOU  Understand these instructions. Will watch your condition. Will get help right away if you are not doing well or get worse.  Your e-visit answers were reviewed by a board certified advanced clinical practitioner to complete your personal care plan.  Depending on the condition, your plan could have included both over the counter or prescription medications.  If there is a problem please reply  once you have received a response from your provider.  Your safety is important to us .  If you have drug allergies check your prescription carefully.    You can use MyChart to ask questions about today's visit, request a non-urgent call back, or ask for a work or school excuse for 24 hours related to this e-Visit. If it has been greater than 24 hours you will need to follow up with your provider, or enter a new e-Visit to address those concerns.  You will get an e-mail in the next two days asking about your experience.  I hope that your e-visit has been valuable and will speed your recovery. Thank you for using e-visits.    I have spent 5 minutes in review of e-visit questionnaire, review and updating patient chart, medical decision making and  response to patient.   Angelia Kelp, PA-C

## 2024-04-27 NOTE — Telephone Encounter (Signed)
 Copied from CRM [id number]. Topic: General - Other >> Apr 27, 2024 10:08 AM Howard Macho wrote: Reason for CRM: patient called stating she is returning a call to Milton Streicher at the office

## 2024-04-27 NOTE — Telephone Encounter (Signed)
 Returned call to patient and notified her of the result(s). She verbalized understanding.  All questions (if any) were answered. She is scheduled for a lab only appointment on 05/08/24 at 11 AM. She thanked me for calling

## 2024-04-27 NOTE — Telephone Encounter (Addendum)
 Called and left a voice message per DPR on file asking patient to give me a call back at the office at [phone] pertaining to her recent labs.  ----- Message from [facility] sent at 04/19/2024  3:18 PM EDT ----- Persistent low potassium due to use of maxzide: sent potassium supplement. low HDL: need to maintain daily exercise Low vit.D: sent high dose 50000IU weekly x 12weeks, then switch to OVER THE COUNTER dose 2000IU daily hgbA1c remains at 5.9%-prediabetes Vaginal swab is positive for BV and trichomonas: advise your partner to get treatment. Sent metronidazole . Abstain from intercourse till repeat test is completed. Schedule lab appointment for repeat urine cytology in 2weeks. Normal renal function

## 2024-05-04 DIAGNOSIS — Z419 Encounter for procedure for purposes other than remedying health state, unspecified: Secondary | ICD-10-CM | POA: Diagnosis not present

## 2024-05-08 ENCOUNTER — Other Ambulatory Visit (HOSPITAL_COMMUNITY)
Admission: RE | Admit: 2024-05-08 | Discharge: 2024-05-08 | Disposition: A | Discharge status: Home / Self Care | Source: Ambulatory Visit | Attending: Nurse Practitioner | Admitting: Nurse Practitioner

## 2024-05-08 ENCOUNTER — Ambulatory Visit: Payer: Self-pay | Admitting: Nurse Practitioner

## 2024-05-08 ENCOUNTER — Other Ambulatory Visit (INDEPENDENT_AMBULATORY_CARE_PROVIDER_SITE_OTHER)

## 2024-05-08 DIAGNOSIS — N760 Acute vaginitis: Secondary | ICD-10-CM

## 2024-05-08 DIAGNOSIS — N921 Excessive and frequent menstruation with irregular cycle: Secondary | ICD-10-CM

## 2024-05-08 DIAGNOSIS — A5901 Trichomonal vulvovaginitis: Secondary | ICD-10-CM | POA: Insufficient documentation

## 2024-05-08 DIAGNOSIS — Z113 Encounter for screening for infections with a predominantly sexual mode of transmission: Secondary | ICD-10-CM | POA: Diagnosis not present

## 2024-05-08 LAB — POCT URINE PREGNANCY: Preg Test, Ur: NEGATIVE

## 2024-05-08 MED ORDER — FLUCONAZOLE 150 MG PO TABS: 150.0000 mg | ORAL_TABLET | Freq: Once | ORAL | 0 refills | Status: AC

## 2024-05-09 LAB — URINE CYTOLOGY ANCILLARY ONLY
Chlamydia: NEGATIVE
Comment: NEGATIVE
Comment: NEGATIVE
Comment: NORMAL
Neisseria Gonorrhea: NEGATIVE
Trichomonas: NEGATIVE

## 2024-05-14 ENCOUNTER — Other Ambulatory Visit: Payer: Self-pay | Admitting: Nurse Practitioner

## 2024-05-14 DIAGNOSIS — E6609 Other obesity due to excess calories: Secondary | ICD-10-CM

## 2024-05-18 ENCOUNTER — Encounter: Payer: Self-pay | Admitting: Nurse Practitioner

## 2024-05-18 ENCOUNTER — Ambulatory Visit (INDEPENDENT_AMBULATORY_CARE_PROVIDER_SITE_OTHER): Admitting: Nurse Practitioner

## 2024-05-18 VITALS — BP 128/76 | HR 78 | Temp 98.3°F | Ht 67.0 in | Wt 206.8 lb

## 2024-05-18 DIAGNOSIS — K219 Gastro-esophageal reflux disease without esophagitis: Secondary | ICD-10-CM | POA: Diagnosis not present

## 2024-05-18 DIAGNOSIS — Z6834 Body mass index (BMI) 34.0-34.9, adult: Secondary | ICD-10-CM

## 2024-05-18 DIAGNOSIS — R079 Chest pain, unspecified: Secondary | ICD-10-CM

## 2024-05-18 DIAGNOSIS — E6609 Other obesity due to excess calories: Secondary | ICD-10-CM

## 2024-05-18 DIAGNOSIS — Z6832 Body mass index (BMI) 32.0-32.9, adult: Secondary | ICD-10-CM

## 2024-05-18 DIAGNOSIS — E66811 Obesity, class 1: Secondary | ICD-10-CM | POA: Diagnosis not present

## 2024-05-18 MED ORDER — ZEPBOUND 10 MG/0.5ML ~~LOC~~ SOAJ: 10.0000 mg | SUBCUTANEOUS | 1 refills | Status: DC

## 2024-05-18 MED ORDER — OMEPRAZOLE 20 MG PO CPDR: 20.0000 mg | DELAYED_RELEASE_CAPSULE | Freq: Every morning | ORAL | 1 refills | Status: AC

## 2024-05-18 NOTE — Assessment & Plan Note (Signed)
 Lost 13lbs lbs in last 40month with zepbound  10mg . Resolved constipation and GERD symptoms. Exercise: cardio and strength training daily. Diet: a day, low fat  Wt Readings from Last 3 Encounters:  05/18/24 206 lb 12.8 oz (93.8 kg)  04/18/24 219 lb 3.2 oz (99.4 kg)  02/09/24 220 lb 3.2 oz (99.9 kg)    BP Readings from Last 3 Encounters:  05/18/24 128/76  04/18/24 120/82  02/09/24 124/84    Encouraged to maintain a high protein and high fiber diet Maintain med dose F/up in 2months

## 2024-05-18 NOTE — Progress Notes (Signed)
 Established Patient Visit  Patient: Jessica Greene   DOB: 12/02/1985   38 y.o. Female  MRN: [id number] Visit Date: 05/18/2024  Subjective:    Chief Complaint  Patient presents with   Follow-up    4 week follow up    HPI GERD (gastroesophageal reflux disease) Improved with PPI and diet modification. Maintain omeprazole  dose  Obesity Lost 13lbs lbs in last 23month with zepbound  10mg . Resolved constipation and GERD symptoms. Exercise: cardio and strength training daily. Diet: a day, low fat  Wt Readings from Last 3 Encounters:  05/18/24 206 lb 12.8 oz (93.8 kg)  04/18/24 219 lb 3.2 oz (99.4 kg)  02/09/24 220 lb 3.2 oz (99.9 kg)    BP Readings from Last 3 Encounters:  05/18/24 128/76  04/18/24 120/82  02/09/24 124/84    Encouraged to maintain a high protein and high fiber diet Maintain med dose F/up in 2months   Reviewed medical, surgical, and social history today  Medications: Outpatient Medications Prior to Visit  Medication Sig   pravastatin  (PRAVACHOL ) 20 MG tablet TAKE 1 TABLET BY MOUTH AT BEDTIME   triamcinolone  cream (KENALOG ) 0.1 % Apply 1 Application topically 2 (two) times daily. (Patient taking differently: Apply 1 Application topically as needed.)   triamterene -hydrochlorothiazide  (MAXZIDE-25) 37.5-25 MG tablet Take 1/2 (one-half) tablet by mouth once daily   [DISCONTINUED] omeprazole  (PRILOSEC) 20 MG capsule Take 1 capsule (20 mg total) by mouth in the morning.   [DISCONTINUED] Vitamin D , Ergocalciferol , (DRISDOL ) 1.25 MG (50000 UNIT) CAPS capsule Take 1 capsule (50,000 Units total) by mouth every 7 (seven) days.   [DISCONTINUED] ZEPBOUND  10 MG/0.5ML Pen INJECT 10 MG INTO THE SKIN ONCE A WEEK   [DISCONTINUED] benzonatate  (TESSALON ) 100 MG capsule Take 1-2 capsules (100-200 mg total) by mouth 3 (three) times daily as needed.   [DISCONTINUED] fluticasone  (FLONASE ) 50 MCG/ACT nasal spray Place 2 sprays into both nostrils daily.    [DISCONTINUED] oseltamivir  (TAMIFLU ) 75 MG capsule Take 1 capsule (75 mg total) by mouth 2 (two) times daily.   No facility-administered medications prior to visit.   Reviewed past medical and social history.   ROS per HPI above      Objective:  BP 128/76 (BP Location: Left Arm, Patient Position: Sitting, Cuff Size: Large)   Pulse 78   Temp 98.3 F (36.8 C) (Oral)   Ht 5' 7 (1.702 m)   Wt 206 lb 12.8 oz (93.8 kg)   LMP 05/03/2024   SpO2 98%   BMI 32.39 kg/m      Physical Exam  No results found for any visits on 05/18/24.    Assessment & Plan:    Problem List Items Addressed This Visit     GERD (gastroesophageal reflux disease)   Improved with PPI and diet modification. Maintain omeprazole  dose      Relevant Medications   omeprazole  (PRILOSEC) 20 MG capsule   Obesity   Lost 13lbs lbs in last 23month with zepbound  10mg . Resolved constipation and GERD symptoms. Exercise: cardio and strength training daily. Diet: a day, low fat  Wt Readings from Last 3 Encounters:  05/18/24 206 lb 12.8 oz (93.8 kg)  04/18/24 219 lb 3.2 oz (99.4 kg)  02/09/24 220 lb 3.2 oz (99.9 kg)    BP Readings from Last 3 Encounters:  05/18/24 128/76  04/18/24 120/82  02/09/24 124/84    Encouraged to maintain a high protein and high fiber diet  Maintain med dose F/up in 2months      Relevant Medications   ZEPBOUND  10 MG/0.5ML Pen   Other Visit Diagnoses       Chest pain, unspecified type    -  Primary      Return in about 2 months (around 07/18/2024) for Weight management, prediabetes, vit. D deficiency, hyperlipidemia (fasting).     Roselie Mood, NP

## 2024-05-18 NOTE — Patient Instructions (Signed)
 Maintain mounjaro  dose Maintain high protein, high fiber diet. Maintain daily exercise: per week (cardio and weight training) Start vit. D3 2000IU daily

## 2024-05-18 NOTE — Assessment & Plan Note (Signed)
 Improved with PPI and diet modification. Maintain omeprazole  dose

## 2024-06-02 ENCOUNTER — Telehealth: Admitting: Physician Assistant

## 2024-06-02 DIAGNOSIS — L282 Other prurigo: Secondary | ICD-10-CM | POA: Diagnosis not present

## 2024-06-02 MED ORDER — PREDNISONE 10 MG (21) PO TBPK: ORAL_TABLET | ORAL | 0 refills | Status: DC

## 2024-06-02 NOTE — Progress Notes (Signed)
 E Visit for Rash  We are sorry that you are not feeling well. Here is how we plan to help!  Based on what you shared with me you may have an allergic reaction.    Prednisone  10 mg daily for 6 days (see taper instructions below)  Directions for 6 day taper: Day 1: 2 tablets before breakfast, 1 after both lunch & dinner and 2 at bedtime Day 2: 1 tab before breakfast, 1 after both lunch & dinner and 2 at bedtime Day 3: 1 tab at each meal & 1 at bedtime Day 4: 1 tab at breakfast, 1 at lunch, 1 at bedtime Day 5: 1 tab at breakfast & 1 tab at bedtime Day 6: 1 tab at breakfast   HOME CARE:  Take cool showers and avoid direct sunlight. Apply cool compress or wet dressings. Take a bath in an oatmeal bath.  Sprinkle content of one Aveeno packet under running faucet with comfortably warm water.  Bathe for 15-20 minutes, 1-2 times daily.  Pat dry with a towel. Do not rub the rash. Use hydrocortisone cream. Take an antihistamine like Benadryl for widespread rashes that itch.  The adult dose of Benadryl is 25-50 mg by mouth 4 times daily. Caution:  This type of medication may cause sleepiness.  Do not drink alcohol, drive, or operate dangerous machinery while taking antihistamines.  Do not take these medications if you have prostate enlargement.  Read package instructions thoroughly on all medications that you take.  GET HELP RIGHT AWAY IF:  Symptoms don't go away after treatment. Severe itching that persists. If you rash spreads or swells. If you rash begins to smell. If it blisters and opens or develops a yellow-brown crust. You develop a fever. You have a sore throat. You become short of breath.  MAKE SURE YOU:  Understand these instructions. Will watch your condition. Will get help right away if you are not doing well or get worse.  Thank you for choosing an e-visit.  Your e-visit answers were reviewed by a board certified advanced clinical practitioner to complete your personal  care plan. Depending upon the condition, your plan could have included both over the counter or prescription medications.  Please review your pharmacy choice. Make sure the pharmacy is open so you can pick up prescription now. If there is a problem, you may contact your provider through [employer] and have the prescription routed to another pharmacy.  Your safety is important to us . If you have drug allergies check your prescription carefully.   For the next 24 hours you can use MyChart to ask questions about today's visit, request a non-urgent call back, or ask for a work or school excuse. You will get an email in the next two days asking about your experience. I hope that your e-visit has been valuable and will speed your recovery.    I have spent 5 minutes in review of e-visit questionnaire, review and updating patient chart, medical decision making and response to patient.   Delon CHRISTELLA Dickinson, PA-C

## 2024-06-03 DIAGNOSIS — Z419 Encounter for procedure for purposes other than remedying health state, unspecified: Secondary | ICD-10-CM | POA: Diagnosis not present

## 2024-06-08 ENCOUNTER — Other Ambulatory Visit: Payer: Self-pay | Admitting: Nurse Practitioner

## 2024-06-08 DIAGNOSIS — I10 Essential (primary) hypertension: Secondary | ICD-10-CM

## 2024-06-09 MED ORDER — TRIAMTERENE-HCTZ 37.5-25 MG PO TABS: 0.5000 | ORAL_TABLET | Freq: Every day | ORAL | 0 refills | Status: DC

## 2024-06-09 NOTE — Telephone Encounter (Signed)
 Called patient and explained that our office received a refill request from her [facility] pharmacy for Hydrochlorothiazide  12.5 mg daily and it shows that the medication was stopped by [facility] last year. Patient stated that she put in for the wrong medication and glad that I called. When I asked if it was for the combo medication of Triamterne-Hydroclorothiazide (Maxzide) 37.5-25 mg to take 1/2 tablet daily. She confirmed that is what is needed. Informed her that I will send the correct medication to patient's pharmacy

## 2024-06-10 ENCOUNTER — Other Ambulatory Visit: Payer: Self-pay

## 2024-06-10 ENCOUNTER — Emergency Department (HOSPITAL_COMMUNITY)
Admission: EM | Admit: 2024-06-10 | Discharge: 2024-06-10 | Disposition: A | Discharge status: Home / Self Care | Attending: Emergency Medicine | Admitting: Emergency Medicine

## 2024-06-10 ENCOUNTER — Encounter (HOSPITAL_COMMUNITY): Payer: Self-pay | Admitting: *Deleted

## 2024-06-10 DIAGNOSIS — I10 Essential (primary) hypertension: Secondary | ICD-10-CM | POA: Insufficient documentation

## 2024-06-10 DIAGNOSIS — N939 Abnormal uterine and vaginal bleeding, unspecified: Secondary | ICD-10-CM | POA: Insufficient documentation

## 2024-06-10 LAB — CBC
HCT: 33.6 % — ABNORMAL LOW (ref 36.0–46.0)
Hemoglobin: 11 g/dL — ABNORMAL LOW (ref 12.0–15.0)
MCH: 30.6 pg (ref 26.0–34.0)
MCHC: 32.7 g/dL (ref 30.0–36.0)
MCV: 93.3 fL (ref 80.0–100.0)
Platelets: 234 K/uL (ref 150–400)
RBC: 3.6 MIL/uL — ABNORMAL LOW (ref 3.87–5.11)
RDW: 13.8 % (ref 11.5–15.5)
WBC: 7.4 K/uL (ref 4.0–10.5)
nRBC: 0 % (ref 0.0–0.2)

## 2024-06-10 LAB — TYPE AND SCREEN
ABO/RH(D): A POS
Antibody Screen: NEGATIVE

## 2024-06-10 LAB — HCG, SERUM, QUALITATIVE: Preg, Serum: NEGATIVE

## 2024-06-10 LAB — ABO/RH: ABO/RH(D): A POS

## 2024-06-10 MED ORDER — MEGESTROL ACETATE 40 MG PO TABS: 40.0000 mg | ORAL_TABLET | Freq: Every day | ORAL | 0 refills | Status: DC

## 2024-06-10 NOTE — ED Triage Notes (Signed)
 Patient states she has been having vaginal bleeding since June 11th. States it stopped July 1-3 then on the 4th started back and she has been bleeding since, when ask the difference now she stated she bled onto the floor. C/o lower abd. Pain.

## 2024-06-10 NOTE — ED Provider Notes (Signed)
 [place] EMERGENCY DEPARTMENT AT [facility] Provider Note   CSN: [id number] Arrival date & time: 06/10/24  0456     Patient presents with: Vaginal Bleeding   Jessica Greene is a 38 y.o. female.   Patient reports that she is here for heavy vaginal bleeding.  Patient reports she began bleeding and passing large clots.  Patient states that this is the second period that she has had in July.  Patient reports that she missed a period in May.  Patient reports that she was having cramping and heavy bleeding earlier.  Cramping has decreased and bleeding has decreased since being here.  Patient reports she is not having any discharge she denies any risk of sexually transmitted disease.  Patient does not currently have a gynecologist.  She has had a uterine polyp in the past.  Patient has a past medical history of hypertension and prediabetes   Vaginal Bleeding      Prior to Admission medications   Medication Sig Start Date End Date Taking? Authorizing Provider  megestrol  (MEGACE ) 40 MG tablet Take 1 tablet (40 mg total) by mouth daily. 06/10/24  Yes Flint Raring K, PA-C  omeprazole  (PRILOSEC) 20 MG capsule Take 1 capsule (20 mg total) by mouth in the morning. 05/18/24   Nche, Roselie Rockford, NP  pravastatin  (PRAVACHOL ) 20 MG tablet TAKE 1 TABLET BY MOUTH AT BEDTIME 02/29/24   Nche, Roselie Rockford, NP  predniSONE  (STERAPRED UNI-PAK 21 TAB) 10 MG (21) TBPK tablet 6 day taper; take as directed on package instructions 06/02/24   Vivienne Delon HERO, PA-C  triamcinolone  cream (KENALOG ) 0.1 % Apply 1 Application topically 2 (two) times daily. Patient taking differently: Apply 1 Application topically as needed. 12/27/23   Vivienne Delon HERO, PA-C  triamterene -hydrochlorothiazide  (MAXZIDE-25) 37.5-25 MG tablet Take 0.5 tablets by mouth daily. 06/09/24   Nche, Roselie Rockford, NP  ZEPBOUND  10 MG/0.5ML Pen Inject 10 mg into the skin once a week. 05/18/24   Nche, Roselie Rockford, NP     Allergies: Patient has no known allergies.    Review of Systems  Genitourinary:  Positive for vaginal bleeding.  All other systems reviewed and are negative.   Updated Vital Signs BP 113/81 (BP Location: Right Arm)   Pulse 73   Temp 98 F (36.7 C) (Oral)   Resp 18   Ht 5' 7.5 (1.715 m)   Wt 99.3 kg   LMP 05/03/2024   SpO2 100%   BMI 33.79 kg/m   Physical Exam Vitals and nursing note reviewed.  Constitutional:      Appearance: She is well-developed.  HENT:     Head: Normocephalic.  Cardiovascular:     Rate and Rhythm: Normal rate.  Pulmonary:     Effort: Pulmonary effort is normal.  Abdominal:     General: There is no distension.  Genitourinary:    Comments: Small amount of dried blood vaginal vault no clots minimal bleeding Musculoskeletal:        General: Normal range of motion.     Cervical back: Normal range of motion.  Skin:    General: Skin is warm.  Neurological:     General: No focal deficit present.     Mental Status: She is alert and oriented to person, place, and time.     (all labs ordered are listed, but only abnormal results are displayed) Labs Reviewed  CBC - Abnormal; Notable for the following components:      Result Value   RBC 3.60 (*)  Hemoglobin 11.0 (*)    HCT 33.6 (*)    All other components within normal limits  HCG, SERUM, QUALITATIVE  TYPE AND SCREEN  ABO/RH    EKG: None  Radiology: No results found.   Procedures   Medications Ordered in the ED - No data to display                                  Medical Decision Making Reports having 2 periods in July.  Patient reports she was passing large clots earlier today and having cramping  Amount and/or Complexity of Data Reviewed Labs: ordered. Decision-making details documented in ED Course.    Details: Labs ordered reviewed and interpreted patient's hemoglobin is 11  Risk Prescription drug management. Risk Details: Patient is given a prescription for Megace .   She is advised to schedule to see gynecology for further evaluation.  Patient is discharged in stable condition.        Final diagnoses:  Abnormal vaginal bleeding  Abnormal uterine bleeding (AUB)    ED Discharge Orders          Ordered    megestrol  (MEGACE ) 40 MG tablet  Daily        06/10/24 0740           An After Visit Summary was printed and given to the patient.     Flint Sonny POUR, PA-C 06/10/24 0753    Patsey Lot, MD 06/11/24 [phone]

## 2024-06-19 ENCOUNTER — Other Ambulatory Visit (HOSPITAL_COMMUNITY): Payer: Self-pay

## 2024-06-19 ENCOUNTER — Telehealth: Payer: Self-pay

## 2024-06-19 NOTE — Telephone Encounter (Signed)
 Pharmacy Patient Advocate Encounter   Received notification from CoverMyMeds that prior authorization for Zepbound  7.5 is required/requested.   Insurance verification completed.   The patient is insured through [facility] .   Per test claim: Refill too soon. PA is not needed at this time. Medication was filled 06/11/24. Next eligible fill date is 06/27/24.

## 2024-06-19 NOTE — Telephone Encounter (Signed)
 Called patient to inform that a refill for the Zepbound  7.5 mg is to soon to refill with last refill date of 06/11/24 and the next available will be on 06/27/24. Patient stated that she knew that and not sure why it was sent to our office she thinks [facility] has her on automatic refills. She thanked me for calling

## 2024-06-24 ENCOUNTER — Ambulatory Visit

## 2024-06-26 ENCOUNTER — Encounter: Payer: Self-pay | Admitting: Obstetrics and Gynecology

## 2024-06-26 ENCOUNTER — Ambulatory Visit: Admitting: Obstetrics and Gynecology

## 2024-06-26 ENCOUNTER — Other Ambulatory Visit (HOSPITAL_COMMUNITY)
Admission: RE | Admit: 2024-06-26 | Discharge: 2024-06-26 | Disposition: A | Discharge status: Home / Self Care | Source: Ambulatory Visit | Attending: Obstetrics and Gynecology | Admitting: Obstetrics and Gynecology

## 2024-06-26 VITALS — BP 114/76 | HR 73 | Ht 67.0 in | Wt 212.0 lb

## 2024-06-26 DIAGNOSIS — Z01419 Encounter for gynecological examination (general) (routine) without abnormal findings: Secondary | ICD-10-CM | POA: Insufficient documentation

## 2024-06-26 MED ORDER — SLYND 4 MG PO TABS: 1.0000 | ORAL_TABLET | Freq: Every day | ORAL | 12 refills | Status: AC

## 2024-06-26 NOTE — Progress Notes (Signed)
 Subjective:     Jessica Greene is a 38 y.o. female P0 with LMP 06/02/24 and BMI 33 who is here for a comprehensive physical exam. The patient reports an episode of prolonged vaginal bleeding last month lasting 40 days associated with passage of large clots. She admits to skipping a period in June. She is sexually active without complaints or contraception. She reports that her menses seem to become more regular as she has lost weight but continues to skip every month or two. Patient desires STI screening and testing for vaginitis. Patient is without any other complaints  Past Medical History:  Diagnosis Date   Diabetes mellitus without complication (HCC)    GERD (gastroesophageal reflux disease)    Hernia of abdominal cavity    High cholesterol    Hypertension    Polycystic ovarian syndrome    Uterine polyp 2017   Past Surgical History:  Procedure Laterality Date   ESOPHAGOGASTRODUODENOSCOPY ENDOSCOPY  2013   POLYPECTOMY  06/29/2016   s/p hysteroscopic polypectomy    Family History  Problem Relation Age of Onset   Diabetes Mother    Hypertension Mother    Hyperlipidemia Mother    Heart disease Mother    Hypertrophic cardiomyopathy Mother    Hypertension Father    Diabetes Father    Hyperlipidemia Father    Heart disease Father    Stroke Father    Cancer Father        pancreatic, throat, Breast   Clotting disorder Father    Hypertrophic cardiomyopathy Sister    Clotting disorder Sister    Heart disease Sister    Depression Sister    Bipolar disorder Sister    Hypertrophic cardiomyopathy Maternal Aunt    Heart disease Maternal Aunt    Hypertrophic cardiomyopathy Maternal Uncle    Heart disease Maternal Uncle    Heart disease Paternal Aunt    Heart disease Paternal Uncle    Hypertrophic cardiomyopathy Maternal Grandmother    Heart disease Maternal Grandmother    Heart disease Maternal Grandfather    Heart disease Paternal Grandmother    Heart disease Paternal  Grandfather    Birth defects Paternal Grandfather     Social History   Socioeconomic History   Marital status: Married    Spouse name: Not on file   Number of children: Not on file   Years of education: Not on file   Highest education level: Not on file  Occupational History   Not on file  Tobacco Use   Smoking status: Former   Smokeless tobacco: Never  Vaping Use   Vaping status: Never Used  Substance and Sexual Activity   Alcohol use: Yes    Comment: occasionally   Drug use: Yes    Frequency: 1.0 times per week    Types: Marijuana    Comment: use of extasy in past, last used 2009   Sexual activity: Yes    Partners: Male    Birth control/protection: None  Other Topics Concern   Not on file  Social History Narrative   Not on file   Social Drivers of Health   Financial Resource Strain: Not on file  Food Insecurity: Not on file  Transportation Needs: Not on file  Physical Activity: Not on file  Stress: Not on file  Social Connections: Not on file  Intimate Partner Violence: Not on file   Health Maintenance  Topic Date Due   COVID-19 Vaccine (1) Never done   Hepatitis B Vaccines (1 of 3 -  19+ 3-dose series) Never done   HPV VACCINES (1 - 3-dose SCDM series) Never done   INFLUENZA VACCINE  06/23/2024   Cervical Cancer Screening (HPV/Pap Cotest)  04/15/2026   DTaP/Tdap/Td (3 - Td or Tdap) 01/13/2031   Hepatitis C Screening  Completed   HIV Screening  Completed   Meningococcal B Vaccine  Aged Out       Review of Systems Pertinent items noted in HPI and remainder of comprehensive ROS otherwise negative.   Objective:  Blood pressure 114/76, pulse 73, height 5' 7 (1.702 m), weight 212 lb (96.2 kg), last menstrual period 06/02/2024.   GENERAL: Well-developed, well-nourished female in no acute distress.  HEENT: Normocephalic, atraumatic. Sclerae anicteric.  NECK: Supple. Normal thyroid .  LUNGS: Clear to auscultation bilaterally.  HEART: Regular rate and  rhythm. BREASTS: Symmetric in size. No palpable masses or lymphadenopathy, skin changes, or nipple drainage. ABDOMEN: Soft, nontender, nondistended. No organomegaly. PELVIC: Normal external female genitalia. Vagina is pink and rugated.  Normal discharge. Normal appearing cervix. Uterus is normal in size. No adnexal mass or tenderness. Chaperone present during the pelvic exam EXTREMITIES: No cyanosis, clubbing, or edema, 2+ distal pulses.     Assessment:    Healthy female exam.      Plan:    Pap smear collected STI screening per patient requests Vaginal swab collected Patient will be contacted with abnormal results Discussed medical management of AUB with contraception. Patient opted to restart POP See After Visit Summary for Counseling Recommendations

## 2024-06-26 NOTE — Progress Notes (Signed)
 Pt is in office for prolong bleeding.  Pt states last cycle was 40+days. Has been off and on for the last few weeks. Pt also due for AEX.

## 2024-06-27 ENCOUNTER — Ambulatory Visit: Payer: Self-pay | Admitting: Obstetrics and Gynecology

## 2024-06-27 LAB — RPR: RPR Ser Ql: NONREACTIVE

## 2024-06-27 LAB — CERVICOVAGINAL ANCILLARY ONLY
Bacterial Vaginitis (gardnerella): POSITIVE — AB
Candida Glabrata: NEGATIVE
Candida Vaginitis: NEGATIVE
Chlamydia: NEGATIVE
Comment: NEGATIVE
Comment: NEGATIVE
Comment: NEGATIVE
Comment: NEGATIVE
Comment: NEGATIVE
Comment: NORMAL
Neisseria Gonorrhea: NEGATIVE
Trichomonas: NEGATIVE

## 2024-06-27 LAB — HEPATITIS C ANTIBODY: Hep C Virus Ab: NONREACTIVE

## 2024-06-27 LAB — HEPATITIS B SURFACE ANTIGEN: Hepatitis B Surface Ag: NEGATIVE

## 2024-06-27 LAB — HIV ANTIBODY (ROUTINE TESTING W REFLEX): HIV Screen 4th Generation wRfx: NONREACTIVE

## 2024-06-27 MED ORDER — METRONIDAZOLE 500 MG PO TABS: 500.0000 mg | ORAL_TABLET | Freq: Two times a day (BID) | ORAL | 0 refills | Status: DC

## 2024-07-04 DIAGNOSIS — Z419 Encounter for procedure for purposes other than remedying health state, unspecified: Secondary | ICD-10-CM | POA: Diagnosis not present

## 2024-07-04 LAB — CYTOLOGY - PAP
Comment: NEGATIVE
Diagnosis: NEGATIVE
High risk HPV: POSITIVE — AB

## 2024-07-06 ENCOUNTER — Other Ambulatory Visit (HOSPITAL_COMMUNITY): Payer: Self-pay

## 2024-07-06 ENCOUNTER — Telehealth: Payer: Self-pay

## 2024-07-06 NOTE — Telephone Encounter (Signed)
 Pharmacy Patient Advocate Encounter  Received notification from [employer] that Prior Authorization for Zepbound  10MG /0.5ML pen-injectors  has been APPROVED from 07/06/24 to 07/05/25. Ran test claim, Copay is $24.99. This test claim was processed through [facility]- copay amounts may vary at other pharmacies due to pharmacy/plan contracts, or as the patient moves through the different stages of their insurance plan.   PA #/Case ID/Reference #: [id number]

## 2024-07-10 ENCOUNTER — Other Ambulatory Visit (HOSPITAL_COMMUNITY): Payer: Self-pay

## 2024-07-18 ENCOUNTER — Telehealth: Payer: Self-pay

## 2024-07-18 ENCOUNTER — Ambulatory Visit (INDEPENDENT_AMBULATORY_CARE_PROVIDER_SITE_OTHER): Admitting: Nurse Practitioner

## 2024-07-18 ENCOUNTER — Encounter: Payer: Self-pay | Admitting: Nurse Practitioner

## 2024-07-18 ENCOUNTER — Other Ambulatory Visit (HOSPITAL_COMMUNITY)
Admission: RE | Admit: 2024-07-18 | Discharge: 2024-07-18 | Disposition: A | Discharge status: Home / Self Care | Source: Ambulatory Visit | Attending: Nurse Practitioner | Admitting: Nurse Practitioner

## 2024-07-18 VITALS — BP 130/76 | HR 90 | Temp 98.8°F | Ht 67.5 in | Wt 209.0 lb

## 2024-07-18 DIAGNOSIS — Z113 Encounter for screening for infections with a predominantly sexual mode of transmission: Secondary | ICD-10-CM | POA: Insufficient documentation

## 2024-07-18 DIAGNOSIS — D500 Iron deficiency anemia secondary to blood loss (chronic): Secondary | ICD-10-CM | POA: Diagnosis not present

## 2024-07-18 DIAGNOSIS — I10 Essential (primary) hypertension: Secondary | ICD-10-CM | POA: Diagnosis not present

## 2024-07-18 DIAGNOSIS — N939 Abnormal uterine and vaginal bleeding, unspecified: Secondary | ICD-10-CM

## 2024-07-18 DIAGNOSIS — E782 Mixed hyperlipidemia: Secondary | ICD-10-CM | POA: Diagnosis not present

## 2024-07-18 DIAGNOSIS — Z6834 Body mass index (BMI) 34.0-34.9, adult: Secondary | ICD-10-CM

## 2024-07-18 DIAGNOSIS — E6609 Other obesity due to excess calories: Secondary | ICD-10-CM

## 2024-07-18 DIAGNOSIS — E66811 Obesity, class 1: Secondary | ICD-10-CM

## 2024-07-18 DIAGNOSIS — Z6832 Body mass index (BMI) 32.0-32.9, adult: Secondary | ICD-10-CM

## 2024-07-18 LAB — CBC WITH DIFFERENTIAL/PLATELET
Basophils Absolute: 0 K/uL (ref 0.0–0.1)
Basophils Relative: 0.6 % (ref 0.0–3.0)
Eosinophils Absolute: 0.2 K/uL (ref 0.0–0.7)
Eosinophils Relative: 3.4 % (ref 0.0–5.0)
HCT: 35.7 % — ABNORMAL LOW (ref 36.0–46.0)
Hemoglobin: 11.8 g/dL — ABNORMAL LOW (ref 12.0–15.0)
Lymphocytes Relative: 57.4 % — ABNORMAL HIGH (ref 12.0–46.0)
Lymphs Abs: 3 K/uL (ref 0.7–4.0)
MCHC: 33 g/dL (ref 30.0–36.0)
MCV: 91.8 fl (ref 78.0–100.0)
Monocytes Absolute: 0.4 K/uL (ref 0.1–1.0)
Monocytes Relative: 7.1 % (ref 3.0–12.0)
Neutro Abs: 1.6 K/uL (ref 1.4–7.7)
Neutrophils Relative %: 31.5 % — ABNORMAL LOW (ref 43.0–77.0)
Platelets: 275 K/uL (ref 150.0–400.0)
RBC: 3.89 Mil/uL (ref 3.87–5.11)
RDW: 13.7 % (ref 11.5–15.5)
WBC: 5.2 K/uL (ref 4.0–10.5)

## 2024-07-18 LAB — COMPREHENSIVE METABOLIC PANEL WITH GFR
ALT: 15 U/L (ref 0–35)
AST: 18 U/L (ref 0–37)
Albumin: 4.2 g/dL (ref 3.5–5.2)
Alkaline Phosphatase: 65 U/L (ref 39–117)
BUN: 8 mg/dL (ref 6–23)
CO2: 28 meq/L (ref 19–32)
Calcium: 8.8 mg/dL (ref 8.4–10.5)
Chloride: 101 meq/L (ref 96–112)
Creatinine, Ser: 0.73 mg/dL (ref 0.40–1.20)
GFR: 103.8 mL/min (ref >60.00)
Glucose, Bld: 88 mg/dL (ref 70–99)
Potassium: 3 meq/L — ABNORMAL LOW (ref 3.5–5.1)
Sodium: 140 meq/L (ref 135–145)
Total Bilirubin: 0.3 mg/dL (ref 0.2–1.2)
Total Protein: 7.4 g/dL (ref 6.0–8.3)

## 2024-07-18 LAB — LIPID PANEL
Cholesterol: 140 mg/dL (ref 0–200)
HDL: 38.9 mg/dL — ABNORMAL LOW (ref >39.00)
LDL Cholesterol: 79 mg/dL (ref 0–99)
NonHDL: 100.8
Total CHOL/HDL Ratio: 4
Triglycerides: 109 mg/dL (ref 0.0–149.0)
VLDL: 21.8 mg/dL (ref 0.0–40.0)

## 2024-07-18 LAB — TSH: TSH: 2.68 u[IU]/mL (ref 0.35–5.50)

## 2024-07-18 MED ORDER — ZEPBOUND 10 MG/0.5ML ~~LOC~~ SOAJ: 10.0000 mg | SUBCUTANEOUS | 0 refills | Status: DC

## 2024-07-18 MED ORDER — TRIAMTERENE-HCTZ 37.5-25 MG PO TABS: 0.5000 | ORAL_TABLET | Freq: Every day | ORAL | 0 refills | Status: DC

## 2024-07-18 NOTE — Addendum Note (Signed)
 Addended by: LENON ROUGHEN on: 07/18/2024 02:18 PM   Modules accepted: Orders

## 2024-07-18 NOTE — Assessment & Plan Note (Addendum)
 Lost 10lbs lbs in last 53month with zepbound  10mg . Total weight loss with zepbound  in last 5months: 23lbs Resolved constipation and GERD symptoms. Exercise: no consistent diet due to menorrhagia and dysmenorrhea Diet: daily due to anorexia with menorrhea  Wt Readings from Last 3 Encounters:  07/18/24 209 lb (94.8 kg)  06/26/24 212 lb (96.2 kg)  06/10/24 219 lb (99.3 kg)    BP Readings from Last 3 Encounters:  07/18/24 130/76  06/26/24 114/76  06/10/24 113/81    Encouraged to maintain a high protein and high fiber diet Maintain med dose, but skip injection if persistent anorexia with menorrhagia F/up in 17month

## 2024-07-18 NOTE — Assessment & Plan Note (Addendum)
 Had eval by GYN Oral progesterone prescribed but she has not started med. Reports fatigue and dysmenorrhea with heavy menstrual cycle, use of 2-3pads every 1hr. She is requesting for repeat STI screen due to vaginal itching  Check CBC, iron  panel, PT/INR, CMP, Tsh. Advised to use start progesterone as prescribed, use naproxen  500mg  every 12hrs or Ibuprofen  600mg  every 8hrs as needed for pain take with food, Maintain adequate oral hydration-64oz of water, and iron  rich diet. Need to continue use of condom due to interaction between an oral contraception and GLP-1RA F/up in 100month

## 2024-07-18 NOTE — Telephone Encounter (Signed)
 Our lab tech Reardan received a call from [place] at the [place] lab that patient's PT INR needs to be recollected due to not enough sample in the tube. Angel informed me of the matter. Patient called

## 2024-07-18 NOTE — Patient Instructions (Addendum)
 For dysmenorrhea: ok to use naproxen  500mg  every 12hrs or Ibuprofen  600mg  every 8hrs as needed for pain take with food. Maintain adequate oral hydration-64oz of water. Go to lab  Iron -Rich Diet  Iron  is a mineral that helps your body produce hemoglobin. Hemoglobin is a protein in red blood cells that carries oxygen to your body's tissues. Eating too little iron  may cause you to feel weak and tired, and it can increase your risk of infection. Iron  is naturally found in many foods, and many foods have iron  added to them (are iron -fortified). You may need to follow an iron -rich diet if you do not have enough iron  in your body due to certain medical conditions. The amount of iron  that you need each day depends on your age, your sex, and any medical conditions you have. Follow instructions from your health care provider or a dietitian about how much iron  you should eat each day. What are tips for following this plan? Reading food labels Check food labels to see how many milligrams (mg) of iron  are in each serving. Cooking Cook foods in pots and pans that are made from iron . Take these steps to make it easier for your body to absorb iron  from certain foods: Soak beans overnight before cooking. Soak whole grains overnight and drain them before using. Ferment flours before baking, such as by using yeast in bread dough. Meal planning When you eat foods that contain iron , you should eat them with foods that are high in vitamin C. These include oranges, peppers, tomatoes, potatoes, and mangoes. Vitamin C helps your body absorb iron . Certain foods and drinks prevent your body from absorbing iron  properly. Avoid eating these foods in the same meal as iron -rich foods or with iron  supplements. These foods include: Coffee, black tea, and red wine. Milk, dairy products, and foods that are high in calcium. Beans and soybeans. Whole grains. General information Take iron  supplements only as told by your health  care provider. An overdose of iron  can be life-threatening. If you were prescribed iron  supplements, take them with orange juice or a vitamin C supplement. When you eat iron -fortified foods or take an iron  supplement, you should also eat foods that naturally contain iron , such as meat, poultry, and fish. Eating naturally iron -rich foods helps your body absorb the iron  that is added to other foods or contained in a supplement. Iron  from animal sources is better absorbed than iron  from plant sources. What foods should I eat? Vegetables Spinach (cooked). Green peas. Broccoli. Fermented vegetables. Eat vegetables high in vitamin C, such as leafy greens, potatoes, bell peppers, and tomatoes, with iron -rich foods. Grains Iron -fortified breakfast cereal. Iron -fortified whole-wheat bread. Enriched rice. Sprouted grains. Meats and other proteins Beef liver. Beef. [place]. Chicken. Oysters. Shrimp. Tuna. Sardines. Chickpeas. Nuts. Tofu. Pumpkin seeds. Beverages Tomato juice. Fresh orange juice. Prune juice. Hibiscus tea. Iron -fortified instant breakfast shakes. Sweets and desserts Blackstrap molasses. Seasonings and condiments Tahini. Fermented soy sauce. Other foods Wheat germ. The items listed above may not be a complete list of recommended foods and beverages. Contact a dietitian for more information. What foods should I limit? These are foods that should be limited while eating iron -rich foods as they can reduce the absorption of iron  in your body. Grains Whole grains. Bran cereal. Bran flour. Meats and other proteins Soybeans. Products made from soy protein. Black beans. Lentils. Mung beans. Split peas. Dairy Milk. Cream. Cheese. Yogurt. Cottage cheese. Beverages Coffee. Black tea. Red wine. Sweets and desserts Cocoa. Chocolate. Ice  cream. Seasonings and condiments Basil. Oregano. Large amounts of parsley. The items listed above may not be a complete list of foods and beverages you should  limit. Contact a dietitian for more information. Summary Iron  is a mineral that helps your body produce hemoglobin. Hemoglobin is a protein in red blood cells that carries oxygen to your body's tissues. Iron  is naturally found in many foods, and many foods have iron  added to them (are iron -fortified). When you eat foods that contain iron , you should eat them with foods that are high in vitamin C. Vitamin C helps your body absorb iron . Certain foods and drinks prevent your body from absorbing iron  properly, such as whole grains and dairy products. You should avoid eating these foods in the same meal as iron -rich foods or with iron  supplements. This information is not intended to replace advice given to you by your health care provider. Make sure you discuss any questions you have with your health care provider. Document Revised: 10/24/2023 Document Reviewed: 10/24/2023 Elsevier Patient Education  2025 [employer].

## 2024-07-18 NOTE — Telephone Encounter (Addendum)
 Called patient and asked if she could possibly return to the office to have a lab recollect due to not enough sample in the tube. She asked if the swab was okay and I stated that all that I was informed of was the blood work and not the swab sample. I informed her that if she can not come back today she can come in the morning

## 2024-07-18 NOTE — Progress Notes (Signed)
 Established Patient Visit  Patient: Jessica Greene   DOB: August 20, 1986   38 y.o. Female  MRN: [id number] Visit Date: 07/18/2024  Subjective:    Chief Complaint  Patient presents with   Follow-up    2 month follow up weight management, hyperlipidemia  DUE    HPI HTN (hypertension) BP at goal with maxzide BP Readings from Last 3 Encounters:  07/18/24 130/76  06/26/24 114/76  06/10/24 113/81    Repeat CMP Maintain med dose  Abnormal uterine bleeding Had eval by GYN Oral progesterone prescribed but she has not started med. Reports fatigue and dysmenorrhea with heavy menstrual cycle, use of 2-3pads every 1hr. She is requesting for repeat STI screen due to vaginal itching  Check CBC, iron  panel, PT/INR, CMP, Tsh. Advised to use start progesterone as prescribed, use naproxen  500mg  every 12hrs or Ibuprofen  600mg  every 8hrs as needed for pain take with food, Maintain adequate oral hydration-64oz of water, and iron  rich diet. Need to continue use of condom due to interaction between an oral contraception and GLP-1RA F/up in 88month  Obesity Lost 10lbs lbs in last 86month with zepbound  10mg . Total weight loss with zepbound  in last 5months: 23lbs Resolved constipation and GERD symptoms. Exercise: no consistent diet due to menorrhagia and dysmenorrhea Diet: daily due to anorexia with menorrhea  Wt Readings from Last 3 Encounters:  07/18/24 209 lb (94.8 kg)  06/26/24 212 lb (96.2 kg)  06/10/24 219 lb (99.3 kg)    BP Readings from Last 3 Encounters:  07/18/24 130/76  06/26/24 114/76  06/10/24 113/81    Encouraged to maintain a high protein and high fiber diet Maintain med dose, but skip injection if persistent anorexia with menorrhagia F/up in 88month  Wt Readings from Last 3 Encounters:  07/18/24 209 lb (94.8 kg)  06/26/24 212 lb (96.2 kg)  06/10/24 219 lb (99.3 kg)    Reviewed medical, surgical, and social history today  Medications: Outpatient  Medications Prior to Visit  Medication Sig Note   omeprazole  (PRILOSEC) 20 MG capsule Take 1 capsule (20 mg total) by mouth in the morning.    pravastatin  (PRAVACHOL ) 20 MG tablet TAKE 1 TABLET BY MOUTH AT BEDTIME    [DISCONTINUED] triamcinolone  cream (KENALOG ) 0.1 % Apply 1 Application topically 2 (two) times daily. (Patient taking differently: Apply 1 Application topically as needed.)    [DISCONTINUED] triamterene -hydrochlorothiazide  (MAXZIDE-25) 37.5-25 MG tablet Take 0.5 tablets by mouth daily.    [DISCONTINUED] ZEPBOUND  10 MG/0.5ML Pen Inject 10 mg into the skin once a week.    Drospirenone  (SLYND ) 4 MG TABS Take 1 tablet (4 mg total) by mouth daily. 07/18/2024: Have not started yet    [DISCONTINUED] megestrol  (MEGACE ) 40 MG tablet Take 1 tablet (40 mg total) by mouth daily. (Patient not taking: Reported on 07/18/2024)    [DISCONTINUED] metroNIDAZOLE  (FLAGYL ) 500 MG tablet Take 1 tablet (500 mg total) by mouth 2 (two) times daily. (Patient not taking: Reported on 07/18/2024)    [DISCONTINUED] predniSONE  (STERAPRED UNI-PAK 21 TAB) 10 MG (21) TBPK tablet 6 day taper; take as directed on package instructions (Patient not taking: Reported on 07/18/2024)    No facility-administered medications prior to visit.   Reviewed past medical and social history.   ROS per HPI above      Objective:  BP 130/76 (BP Location: Left Arm, Patient Position: Sitting, Cuff Size: Large)   Pulse 90   Temp 98.8 F (  37.1 C) (Oral)   Ht 5' 7.5 (1.715 m)   Wt 209 lb (94.8 kg)   LMP 07/16/2024   SpO2 100%   BMI 32.25 kg/m      Physical Exam Vitals and nursing note reviewed.  Constitutional:      Appearance: She is obese.  Cardiovascular:     Rate and Rhythm: Normal rate and regular rhythm.     Pulses: Normal pulses.     Heart sounds: Normal heart sounds.  Pulmonary:     Effort: Pulmonary effort is normal.     Breath sounds: Normal breath sounds.  Abdominal:     General: There is no distension.      Palpations: Abdomen is soft.     Tenderness: There is abdominal tenderness in the suprapubic area. There is no right CVA tenderness, left CVA tenderness or guarding.  Neurological:     Mental Status: She is alert and oriented to person, place, and time.     No results found for any visits on 07/18/24.    Assessment & Plan:    Problem List Items Addressed This Visit     Abnormal uterine bleeding   Had eval by GYN Oral progesterone prescribed but she has not started med. Reports fatigue and dysmenorrhea with heavy menstrual cycle, use of 2-3pads every 1hr. She is requesting for repeat STI screen due to vaginal itching  Check CBC, iron  panel, PT/INR, CMP, Tsh. Advised to use start progesterone as prescribed, use naproxen  500mg  every 12hrs or Ibuprofen  600mg  every 8hrs as needed for pain take with food, Maintain adequate oral hydration-64oz of water, and iron  rich diet. Need to continue use of condom due to interaction between an oral contraception and GLP-1RA F/up in 75month      Relevant Orders   Iron , TIBC and Ferritin Panel   CBC with Differential/Platelet   Protime-INR   Cervicovaginal ancillary only( [facility])   TSH   HTN (hypertension)   BP at goal with maxzide BP Readings from Last 3 Encounters:  07/18/24 130/76  06/26/24 114/76  06/10/24 113/81    Repeat CMP Maintain med dose      Relevant Medications   triamterene -hydrochlorothiazide  (MAXZIDE-25) 37.5-25 MG tablet   Other Relevant Orders   Comprehensive metabolic panel with GFR   Iron  deficiency anemia due to chronic blood loss   Relevant Orders   Iron , TIBC and Ferritin Panel   CBC with Differential/Platelet   Mixed hyperlipidemia   Relevant Medications   triamterene -hydrochlorothiazide  (MAXZIDE-25) 37.5-25 MG tablet   Other Relevant Orders   Lipid panel   Comprehensive metabolic panel with GFR   Obesity - Primary   Lost 10lbs lbs in last 76month with zepbound  10mg . Total weight loss with zepbound  in  last 5months: 23lbs Resolved constipation and GERD symptoms. Exercise: no consistent diet due to menorrhagia and dysmenorrhea Diet: daily due to anorexia with menorrhea  Wt Readings from Last 3 Encounters:  07/18/24 209 lb (94.8 kg)  06/26/24 212 lb (96.2 kg)  06/10/24 219 lb (99.3 kg)    BP Readings from Last 3 Encounters:  07/18/24 130/76  06/26/24 114/76  06/10/24 113/81    Encouraged to maintain a high protein and high fiber diet Maintain med dose, but skip injection if persistent anorexia with menorrhagia F/up in 75month      Relevant Medications   ZEPBOUND  10 MG/0.5ML Pen   Other Relevant Orders   Comprehensive metabolic panel with GFR   Return in about 8 weeks (around 09/12/2024) for  Weight management.     Roselie Mood, NP

## 2024-07-18 NOTE — Assessment & Plan Note (Signed)
 BP at goal with maxzide BP Readings from Last 3 Encounters:  07/18/24 130/76  06/26/24 114/76  06/10/24 113/81    Repeat CMP Maintain med dose

## 2024-07-19 LAB — CERVICOVAGINAL ANCILLARY ONLY
Bacterial Vaginitis (gardnerella): POSITIVE — AB
Candida Glabrata: NEGATIVE
Candida Vaginitis: NEGATIVE
Chlamydia: NEGATIVE
Comment: NEGATIVE
Comment: NEGATIVE
Comment: NEGATIVE
Comment: NEGATIVE
Comment: NEGATIVE
Comment: NORMAL
Neisseria Gonorrhea: NEGATIVE
Trichomonas: NEGATIVE

## 2024-07-19 LAB — IRON,TIBC AND FERRITIN PANEL
%SAT: 13 % — ABNORMAL LOW (ref 16–45)
Ferritin: 20 ng/mL (ref 16–154)
Iron: 48 ug/dL (ref 40–190)
TIBC: 357 ug/dL (ref 250–450)

## 2024-07-19 LAB — PROTIME-INR
INR: 1.1 ratio — ABNORMAL HIGH (ref 0.8–1.0)
Prothrombin Time: 11.7 s (ref 9.6–13.1)

## 2024-07-21 ENCOUNTER — Ambulatory Visit: Payer: Self-pay | Admitting: Nurse Practitioner

## 2024-07-21 DIAGNOSIS — B9689 Other specified bacterial agents as the cause of diseases classified elsewhere: Secondary | ICD-10-CM

## 2024-07-21 DIAGNOSIS — D500 Iron deficiency anemia secondary to blood loss (chronic): Secondary | ICD-10-CM

## 2024-07-21 DIAGNOSIS — T502X5A Adverse effect of carbonic-anhydrase inhibitors, benzothiadiazides and other diuretics, initial encounter: Secondary | ICD-10-CM

## 2024-07-21 MED ORDER — METRONIDAZOLE 0.75 % VA GEL: 1.0000 | Freq: Every day | VAGINAL | 0 refills | Status: DC

## 2024-07-21 MED ORDER — IRON (FERROUS SULFATE) 325 (65 FE) MG PO TABS: 325.0000 mg | ORAL_TABLET | ORAL | Status: AC

## 2024-07-21 MED ORDER — POTASSIUM CHLORIDE CRYS ER 20 MEQ PO TBCR: 20.0000 meq | EXTENDED_RELEASE_TABLET | Freq: Every day | ORAL | 0 refills | Status: DC

## 2024-08-04 ENCOUNTER — Other Ambulatory Visit: Payer: Self-pay | Admitting: Nurse Practitioner

## 2024-08-04 ENCOUNTER — Encounter: Payer: Self-pay | Admitting: Nurse Practitioner

## 2024-08-04 DIAGNOSIS — Z419 Encounter for procedure for purposes other than remedying health state, unspecified: Secondary | ICD-10-CM | POA: Diagnosis not present

## 2024-08-04 DIAGNOSIS — E782 Mixed hyperlipidemia: Secondary | ICD-10-CM

## 2024-08-04 MED ORDER — PRAVASTATIN SODIUM 20 MG PO TABS: 20.0000 mg | ORAL_TABLET | Freq: Every day | ORAL | 2 refills | Status: AC

## 2024-08-24 ENCOUNTER — Ambulatory Visit
Admission: RE | Admit: 2024-08-24 | Discharge: 2024-08-24 | Disposition: A | Discharge status: Home / Self Care | Source: Ambulatory Visit | Attending: Family Medicine | Admitting: Family Medicine

## 2024-08-24 VITALS — BP 130/83 | HR 72 | Temp 98.9°F | Resp 17

## 2024-08-24 DIAGNOSIS — N898 Other specified noninflammatory disorders of vagina: Secondary | ICD-10-CM | POA: Insufficient documentation

## 2024-08-24 DIAGNOSIS — Z113 Encounter for screening for infections with a predominantly sexual mode of transmission: Secondary | ICD-10-CM | POA: Diagnosis not present

## 2024-08-24 NOTE — Discharge Instructions (Signed)
 We will treat your symptoms based off results. These should be available tomorrow. If you test positive for BV again, I would recommend using clindamycin  as alternative antibiotic. Would also recommend using fluconazole  to help prevent yeast infection as this would be the third antibiotic course in a month for your symptoms. Otherwise, our results nurse will follow the protocol to treat any infections you test positive for.

## 2024-08-24 NOTE — ED Triage Notes (Signed)
 Pt c/o vaginal d/c x 1 month -was treated x 2 for BV/last med completed ~2 weeks ago-NAD-steady gait

## 2024-08-24 NOTE — ED Provider Notes (Signed)
 [facility]  Note:  This document was prepared using Conservation officer, historic buildings and may include unintentional dictation errors.  MRN: [id number] DOB: 1986/02/20  Subjective:   Jessica Greene is a 38 y.o. female presenting for persistent vaginal discharge for the past month. Denies fever, n/v, abdominal pain, pelvic pain, rashes, dysuria, urinary frequency, hematuria.  Had unprotected sex recently. Would like complete vaginal cytology. Has a history of BV and yeast infection. Last episode was 07/18/2024. She underwent a course of Flagyl . Unfortunately, she continued to have symptoms and therefore reached out to the practice and was subsequently prescribed MetroGel  for ongoing BV.  Continues to have symptoms.  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  Would like complete STI testing.  No current facility-administered medications for this encounter.  Current Outpatient Medications:    Drospirenone  (SLYND ) 4 MG TABS, Take 1 tablet (4 mg total) by mouth daily., Disp: 28 tablet, Rfl: 12   Iron , Ferrous Sulfate , 325 (65 Fe) MG TABS, Take 325 mg by mouth every other day., Disp: , Rfl:    metroNIDAZOLE  (METROGEL ) 0.75 % vaginal gel, Place 1 Applicatorful vaginally at bedtime., Disp: 70 g, Rfl: 0   omeprazole  (PRILOSEC) 20 MG capsule, Take 1 capsule (20 mg total) by mouth in the morning., Disp: 90 capsule, Rfl: 1   potassium chloride  SA (KLOR-CON  M) 20 MEQ tablet, Take 1 tablet (20 mEq total) by mouth daily., Disp: 90 tablet, Rfl: 0   pravastatin  (PRAVACHOL ) 20 MG tablet, Take 1 tablet (20 mg total) by mouth at bedtime., Disp: 90 tablet, Rfl: 2   triamterene -hydrochlorothiazide  (MAXZIDE-25) 37.5-25 MG tablet, Take 0.5 tablets by mouth daily., Disp: 45 tablet, Rfl: 0   ZEPBOUND  10 MG/0.5ML Pen, Inject 10 mg into the skin once a week., Disp: 2 mL, Rfl: 0   No Known Allergies  Past Medical History:  Diagnosis Date   Diabetes mellitus without complication  (HCC)    GERD (gastroesophageal reflux disease)    Hernia of abdominal cavity    High cholesterol    Hypertension    Polycystic ovarian syndrome    Uterine polyp 2017     Past Surgical History:  Procedure Laterality Date   ESOPHAGOGASTRODUODENOSCOPY ENDOSCOPY  2013   POLYPECTOMY  06/29/2016   s/p hysteroscopic polypectomy     Family History  Problem Relation Age of Onset   Diabetes Mother    Hypertension Mother    Hyperlipidemia Mother    Heart disease Mother    Hypertrophic cardiomyopathy Mother    Hypertension Father    Diabetes Father    Hyperlipidemia Father    Heart disease Father    Stroke Father    Cancer Father        pancreatic, throat, Breast   Clotting disorder Father    Hypertrophic cardiomyopathy Sister    Clotting disorder Sister    Heart disease Sister    Depression Sister    Bipolar disorder Sister    Hypertrophic cardiomyopathy Maternal Aunt    Heart disease Maternal Aunt    Hypertrophic cardiomyopathy Maternal Uncle    Heart disease Maternal Uncle    Heart disease Paternal Aunt    Heart disease Paternal Uncle    Hypertrophic cardiomyopathy Maternal Grandmother    Heart disease Maternal Grandmother    Heart disease Maternal Grandfather    Heart disease Paternal Grandmother    Heart disease Paternal Grandfather    Birth defects Paternal Grandfather     Social History   Tobacco Use  Smoking status: Former   Smokeless tobacco: Never  Vaping Use   Vaping status: Never Used  Substance Use Topics   Alcohol use: Yes    Comment: occasionally   Drug use: Yes    Frequency: 1.0 times per week    Types: Marijuana    Comment: use of extasy in past, last used 2009    ROS   Objective:   Vitals: BP 130/83 (BP Location: Left Arm)   Pulse 72   Temp 98.9 F (37.2 C) (Oral)   Resp 17   LMP 07/16/2024   SpO2 96%   Physical Exam Constitutional:      General: She is not in acute distress.    Appearance: Normal appearance. She is  well-developed. She is not ill-appearing, toxic-appearing or diaphoretic.  HENT:     Head: Normocephalic and atraumatic.     Nose: Nose normal.     Mouth/Throat:     Mouth: Mucous membranes are moist.  Eyes:     General: No scleral icterus.       Right eye: No discharge.        Left eye: No discharge.     Extraocular Movements: Extraocular movements intact.     Conjunctiva/sclera: Conjunctivae normal.  Cardiovascular:     Rate and Rhythm: Normal rate.  Pulmonary:     Effort: Pulmonary effort is normal.  Abdominal:     General: Bowel sounds are normal. There is no distension.     Palpations: Abdomen is soft. There is no mass.     Tenderness: There is no abdominal tenderness. There is no right CVA tenderness, left CVA tenderness, guarding or rebound.  Skin:    General: Skin is warm and dry.  Neurological:     General: No focal deficit present.     Mental Status: She is alert and oriented to person, place, and time.  Psychiatric:        Mood and Affect: Mood normal.        Behavior: Behavior normal.        Thought Content: Thought content normal.        Judgment: Judgment normal.     Assessment and Plan :   PDMP not reviewed this encounter.  1. Vaginal discharge   2. Screen for STD (sexually transmitted disease)    We will be deferring empiric treatment as she just completed 2 rounds of metronidazole  both orally and topical.  I did recommend to the patient that if she test positive for BV again, we should switch the antibiotic to clindamycin  300 mg twice daily for 7 days and pare this with fluconazole  150 mg once every 3 days for a total of 5 doses.  This will be especially important as she would be undergoing a third round of antibiotics within the month and has a history of yeast infection.  Otherwise, protocols for the infection should be followed.  Patient is in agreement.  Counseled patient on potential for adverse effects with medications prescribed/recommended today, ER  and return-to-clinic precautions discussed, patient verbalized understanding.    Christopher Savannah, PA-C 08/24/24 1013

## 2024-08-25 ENCOUNTER — Ambulatory Visit (HOSPITAL_COMMUNITY): Payer: Self-pay

## 2024-08-25 LAB — CERVICOVAGINAL ANCILLARY ONLY
Bacterial Vaginitis (gardnerella): POSITIVE — AB
Candida Glabrata: POSITIVE — AB
Candida Vaginitis: POSITIVE — AB
Chlamydia: NEGATIVE
Comment: NEGATIVE
Comment: NEGATIVE
Comment: NEGATIVE
Comment: NEGATIVE
Comment: NEGATIVE
Comment: NORMAL
Neisseria Gonorrhea: NEGATIVE
Trichomonas: NEGATIVE

## 2024-08-25 LAB — HIV ANTIBODY (ROUTINE TESTING W REFLEX): HIV Screen 4th Generation wRfx: NONREACTIVE

## 2024-08-25 LAB — RPR: RPR Ser Ql: NONREACTIVE

## 2024-08-25 MED ORDER — FLUCONAZOLE 150 MG PO TABS: 150.0000 mg | ORAL_TABLET | Freq: Once | ORAL | 0 refills | Status: AC

## 2024-08-25 MED ORDER — CLINDAMYCIN HCL 150 MG PO CAPS: 300.0000 mg | ORAL_CAPSULE | Freq: Two times a day (BID) | ORAL | 0 refills | Status: AC

## 2024-08-27 LAB — MISC LABCORP TEST (SEND OUT): Labcorp test code: 180076

## 2024-09-03 ENCOUNTER — Telehealth: Admitting: Emergency Medicine

## 2024-09-03 DIAGNOSIS — J069 Acute upper respiratory infection, unspecified: Secondary | ICD-10-CM

## 2024-09-03 MED ORDER — FLUTICASONE PROPIONATE 50 MCG/ACT NA SUSP: 2.0000 | Freq: Every day | NASAL | 6 refills | Status: AC

## 2024-09-03 MED ORDER — BENZONATATE 100 MG PO CAPS: 100.0000 mg | ORAL_CAPSULE | Freq: Three times a day (TID) | ORAL | 0 refills | Status: DC | PRN

## 2024-09-03 NOTE — Progress Notes (Signed)
 We are sorry you are not feeling well.  Here is how we plan to help!  Based on what you have shared with me, it looks like you may have a viral upper respiratory infection.  Upper respiratory infections are caused by a large number of viruses; however, rhinovirus is the most common cause.   Symptoms vary from person to person, with common symptoms including sore throat, cough, and fatigue or lack of energy.  A low-grade fever of up to 100.4 may present, but is often uncommon.  Symptoms vary however, and are closely related to a person's age or underlying illnesses.  The most common symptoms associated with an upper respiratory infection are nasal discharge or congestion, cough, sneezing, headache and pressure in the ears and face.  These symptoms usually persist for about 3 to 10 days, but can last up to 2 weeks.  It is important to know that upper respiratory infections do not cause serious illness or complications in most cases.    Upper respiratory infections can be transmitted from person to person, with the most common method of transmission being a person's hands.  The virus is able to live on the skin and can infect other persons for up to 2 hours after direct contact.  Also, these can be transmitted when someone coughs or sneezes; thus, it is important to cover the mouth to reduce this risk.  To keep the spread of the illness at bay, good hand hygiene is very important.  This is an infection that is most likely caused by a virus. There are no specific treatments other than to help you with the symptoms until the infection runs its course.  We are sorry you are not feeling well.  Here is how we plan to help!   For nasal congestion, you may use an oral decongestants such as Mucinex  D or if you have glaucoma or high blood pressure use plain Mucinex .  Saline nasal spray or nasal drops can help and can safely be used as often as needed for congestion.  For your congestion, I have prescribed Fluticasone   nasal spray one spray in each nostril twice a day  If you do not have a history of heart disease, hypertension, diabetes or thyroid disease, prostate/bladder issues or glaucoma, you may also use Sudafed to treat nasal congestion.  It is highly recommended that you consult with a pharmacist or your primary care physician to ensure this medication is safe for you to take.     If you have a cough, you may use cough suppressants such as Delsym and Robitussin.  If you have glaucoma or high blood pressure, you can also use Coricidin HBP.   For cough I have prescribed for you A prescription cough medication called Tessalon  Perles 100 mg. You may take 1-2 capsules every 8 hours as needed for cough  If you have a sore or scratchy throat, use a saltwater gargle-  to  teaspoon of salt dissolved in a 4-ounce to 8-ounce glass of warm water.  Gargle the solution for approximately 15-30 seconds and then spit.  It is important not to swallow the solution.  You can also use throat lozenges/cough drops and Chloraseptic spray to help with throat pain or discomfort.  Warm or cold liquids can also be helpful in relieving throat pain.  For headache, pain or general discomfort, you can use Ibuprofen  or Tylenol  as directed.   Some authorities believe that zinc sprays or the use of Echinacea may shorten the  course of your symptoms.   HOME CARE Only take medications as instructed by your medical team. Be sure to drink plenty of fluids. Water is fine as well as fruit juices, sodas and electrolyte beverages. You may want to stay away from caffeine or alcohol. If you are nauseated, try taking small sips of liquids. How do you know if you are getting enough fluid? Your urine should be a pale yellow or almost colorless. Get rest. Taking a steamy shower or using a humidifier may help nasal congestion and ease sore throat pain. You can place a towel over your head and breathe in the steam from hot water coming from a  faucet. Using a saline nasal spray works much the same way. Cough drops, hard candies and sore throat lozenges may ease your cough. Avoid close contacts especially the very young and the elderly Cover your mouth if you cough or sneeze Always remember to wash your hands.   GET HELP RIGHT AWAY IF: You develop worsening fever. If your symptoms do not improve within 10 days You develop yellow or green discharge from your nose over 3 days. You have coughing fits You develop a severe head ache or visual changes. You develop shortness of breath, difficulty breathing or start having chest pain Your symptoms persist after you have completed your treatment plan  MAKE SURE YOU  Understand these instructions. Will watch your condition. Will get help right away if you are not doing well or get worse.  Your e-visit answers were reviewed by a board certified advanced clinical practitioner to complete your personal care plan. Depending upon the condition, your plan could have included both over the counter or prescription medications. Please review your pharmacy choice. If there is a problem, you may call our nursing hot line at and have the prescription routed to another pharmacy. Your safety is important to us . If you have drug allergies check your prescription carefully.   You can use MyChart to ask questions about today's visit, request a non-urgent call back, or ask for a work or school excuse for 24 hours related to this e-Visit. If it has been greater than 24 hours you will need to follow up with your provider, or enter a new e-Visit to address those concerns. You will get an e-mail in the next two days asking about your experience.  I hope that your e-visit has been valuable and will speed your recovery. Thank you for using e-visits.   I have spent 5 minutes in review of e-visit questionnaire, review and updating patient chart, medical decision making and response to patient.   Bari Learn,  FNP

## 2024-09-05 ENCOUNTER — Encounter: Payer: Self-pay | Admitting: Nurse Practitioner

## 2024-09-05 ENCOUNTER — Ambulatory Visit (INDEPENDENT_AMBULATORY_CARE_PROVIDER_SITE_OTHER)

## 2024-09-05 ENCOUNTER — Ambulatory Visit (HOSPITAL_COMMUNITY)
Admission: EM | Admit: 2024-09-05 | Discharge: 2024-09-05 | Disposition: A | Discharge status: Home / Self Care | Attending: Family Medicine | Admitting: Family Medicine

## 2024-09-05 ENCOUNTER — Encounter (HOSPITAL_COMMUNITY): Payer: Self-pay

## 2024-09-05 DIAGNOSIS — J22 Unspecified acute lower respiratory infection: Secondary | ICD-10-CM

## 2024-09-05 DIAGNOSIS — R0789 Other chest pain: Secondary | ICD-10-CM | POA: Diagnosis not present

## 2024-09-05 DIAGNOSIS — R0602 Shortness of breath: Secondary | ICD-10-CM

## 2024-09-05 DIAGNOSIS — R051 Acute cough: Secondary | ICD-10-CM

## 2024-09-05 DIAGNOSIS — J9811 Atelectasis: Secondary | ICD-10-CM | POA: Diagnosis not present

## 2024-09-05 LAB — POC COVID19/FLU A&B COMBO
Covid Antigen, POC: NEGATIVE
Influenza A Antigen, POC: NEGATIVE
Influenza B Antigen, POC: NEGATIVE

## 2024-09-05 MED ORDER — PROMETHAZINE-DM 6.25-15 MG/5ML PO SYRP: 5.0000 mL | ORAL_SOLUTION | Freq: Three times a day (TID) | ORAL | 0 refills | Status: DC | PRN

## 2024-09-05 MED ORDER — AZITHROMYCIN 250 MG PO TABS: ORAL_TABLET | ORAL | 0 refills | Status: DC

## 2024-09-05 MED ORDER — ALBUTEROL SULFATE HFA 108 (90 BASE) MCG/ACT IN AERS
2.0000 | INHALATION_SPRAY | Freq: Once | RESPIRATORY_TRACT | Status: AC
  Administered 2024-09-05: 2 via RESPIRATORY_TRACT

## 2024-09-05 MED ORDER — ALBUTEROL SULFATE HFA 108 (90 BASE) MCG/ACT IN AERS
INHALATION_SPRAY | RESPIRATORY_TRACT | Status: AC
  Filled 2024-09-05: qty 6.7

## 2024-09-05 MED ORDER — PREDNISONE 20 MG PO TABS: 40.0000 mg | ORAL_TABLET | Freq: Every day | ORAL | 0 refills | Status: DC

## 2024-09-05 NOTE — Discharge Instructions (Addendum)
 Flu A, flu B and COVID testing done today.  These are all negative.  Chest x-ray done today which shows some mild atelectasis in the bases but no pneumonia. Due to the symptoms we will treat for a lower respiratory infection.  Given the symptoms and physical exam findings as well as the x-ray findings, we will treat with the following: Azithromycin 250mg  Take 2 tablets today and the 1 tablet daily for 4 more days. Prednisone  40 mg (2 tablets) once daily for 5 days. Take this in the morning.  This is a steroid to help with inflammation and pain. Promethazine DM 5 mL every 8 hours as needed for cough.  Use caution as this medication can cause drowsiness. Hold potassium chloride  while taking this medication Albuterol  inhaler 1-2 puffs every 6 hours as needed for wheezing/shortness of breath. Make sure to stay hydrated by drinking plenty of water. Return to urgent care or PCP if symptoms worsen or fail to resolve.

## 2024-09-05 NOTE — ED Triage Notes (Addendum)
 Patient reports that she had chest congestion, a productive cough with green sputum. Patient states she did an E-visit 2 days ago and was told to come to the UC. Patient c/o mid chest pain that hurts worse with a deep breath, cough, and any movement. Patient states, I am here to r/o pneumonia. Patient also states she has palpitations at times.  Patient states she prescribed antibiotics 2 days from her E-visit, but states she did not have the money to pay for it.

## 2024-09-05 NOTE — ED Provider Notes (Signed)
 [facility]    CSN: [id number] Arrival date & time: 09/05/24  1504      History   Chief Complaint No chief complaint on file.   HPI Jessica Greene is a 38 y.o. female.   38 year old female presents urgent care with complaints of cough, chest congestion, body aches, fevers, chills, sore throat, chest tightness and chest pain.  She reports symptoms started about 3 days ago.  In the last 24 hours the chest tightness and chest pain have worsened but her cough has been worse as well.  She is also having some shortness of breath.  She reports muscle aches in the back, arms, legs.  She has a sore throat as well.  She is not running a fever but has felt hot and cold.  She does not believe she has had any sick exposures.  She did an e-visit yesterday and was called in a medication for cough but was unable to afford this.     Past Medical History:  Diagnosis Date   Diabetes mellitus without complication (HCC)    GERD (gastroesophageal reflux disease)    Hernia of abdominal cavity    High cholesterol    Hypertension    Polycystic ovarian syndrome    Uterine polyp 2017    Patient Active Problem List   Diagnosis Date Noted   Iron  deficiency anemia due to chronic blood loss 07/18/2024   Family history of breast cancer 12/30/2023   Family history of pancreatic cancer 12/30/2023   Family history of clotting disorder 12/30/2023   Abnormal uterine bleeding 08/24/2023   GAD (generalized anxiety disorder) 01/26/2023   Subcutaneous mass of left upper extremity 12/15/2022   Chronic right-sided low back pain with right-sided sciatica 01/28/2022   Adjustment insomnia 07/15/2021   Mixed hyperlipidemia 05/13/2021   Obesity 04/30/2021   Uterine polyp 02/28/2018   Prediabetes 02/28/2018   HTN (hypertension) 02/28/2018   GERD (gastroesophageal reflux disease) 02/28/2018    Past Surgical History:  Procedure Laterality Date   ESOPHAGOGASTRODUODENOSCOPY ENDOSCOPY  2013    POLYPECTOMY  06/29/2016   s/p hysteroscopic polypectomy     OB History     Gravida  2   Para      Term      Preterm      AB  1   Living         SAB  1   IAB      Ectopic      Multiple      Live Births               Home Medications    Prior to Admission medications   Medication Sig Start Date End Date Taking? Authorizing Provider  azithromycin (ZITHROMAX) 250 MG tablet Take first 2 tablets together, then 1 every day until finished. 09/05/24  Yes Tamelia Michalowski A, PA-C  predniSONE  (DELTASONE ) 20 MG tablet Take 2 tablets (40 mg total) by mouth daily with breakfast for 5 days. 09/05/24 09/10/24 Yes Derren Suydam A, PA-C  promethazine-dextromethorphan (PROMETHAZINE-DM) 6.25-15 MG/5ML syrup Take 5 mLs by mouth every 8 (eight) hours as needed for cough. 09/05/24  Yes Wavie Hashimi A, PA-C  benzonatate  (TESSALON  PERLES) 100 MG capsule Take 1 capsule (100 mg total) by mouth 3 (three) times daily as needed. 09/03/24   Lavell Lye A, FNP  Drospirenone  (SLYND ) 4 MG TABS Take 1 tablet (4 mg total) by mouth daily. 06/26/24   Constant, Peggy, MD  fluticasone  (FLONASE ) 50 MCG/ACT nasal spray Place  2 sprays into both nostrils daily. 09/03/24   Hawks, Bari A, FNP  Iron , Ferrous Sulfate , 325 (65 Fe) MG TABS Take 325 mg by mouth every other day. 07/21/24   Nche, Roselie Rockford, NP  metroNIDAZOLE  (METROGEL ) 0.75 % vaginal gel Place 1 Applicatorful vaginally at bedtime. 07/21/24   Nche, Roselie Rockford, NP  omeprazole  (PRILOSEC) 20 MG capsule Take 1 capsule (20 mg total) by mouth in the morning. 05/18/24   Nche, Roselie Rockford, NP  potassium chloride  SA (KLOR-CON  M) 20 MEQ tablet Take 1 tablet (20 mEq total) by mouth daily. 07/21/24   Nche, Roselie Rockford, NP  pravastatin  (PRAVACHOL ) 20 MG tablet Take 1 tablet (20 mg total) by mouth at bedtime. 08/04/24   Nche, Roselie Rockford, NP  triamterene -hydrochlorothiazide  (MAXZIDE-25) 37.5-25 MG tablet Take 0.5 tablets by mouth daily. 07/18/24    Nche, Roselie Rockford, NP  ZEPBOUND  10 MG/0.5ML Pen Inject 10 mg into the skin once a week. 07/18/24   Nche, Roselie Rockford, NP    Family History Family History  Problem Relation Age of Onset   Diabetes Mother    Hypertension Mother    Hyperlipidemia Mother    Heart disease Mother    Hypertrophic cardiomyopathy Mother    Hypertension Father    Diabetes Father    Hyperlipidemia Father    Heart disease Father    Stroke Father    Cancer Father        pancreatic, throat, Breast   Clotting disorder Father    Hypertrophic cardiomyopathy Sister    Clotting disorder Sister    Heart disease Sister    Depression Sister    Bipolar disorder Sister    Hypertrophic cardiomyopathy Maternal Aunt    Heart disease Maternal Aunt    Hypertrophic cardiomyopathy Maternal Uncle    Heart disease Maternal Uncle    Heart disease Paternal Aunt    Heart disease Paternal Uncle    Hypertrophic cardiomyopathy Maternal Grandmother    Heart disease Maternal Grandmother    Heart disease Maternal Grandfather    Heart disease Paternal Grandmother    Heart disease Paternal Grandfather    Birth defects Paternal Grandfather     Social History Social History   Tobacco Use   Smoking status: Former    Types: Cigarettes   Smokeless tobacco: Never  Vaping Use   Vaping status: Never Used  Substance Use Topics   Alcohol use: Yes    Comment: occasionally   Drug use: Not Currently    Types: Marijuana     Allergies   Patient has no known allergies.   Review of Systems Review of Systems  Constitutional:  Positive for chills, fatigue and fever.  HENT:  Positive for congestion and sore throat. Negative for ear pain.   Eyes:  Negative for pain and visual disturbance.  Respiratory:  Positive for cough, chest tightness and shortness of breath.   Cardiovascular:  Negative for chest pain and palpitations.  Gastrointestinal:  Negative for abdominal pain and vomiting.  Genitourinary:  Negative for dysuria and  hematuria.  Musculoskeletal:  Positive for myalgias. Negative for arthralgias and back pain.  Skin:  Negative for color change and rash.  Neurological:  Negative for seizures and syncope.  All other systems reviewed and are negative.    Physical Exam Triage Vital Signs ED Triage Vitals  Encounter Vitals Group     BP 09/05/24 1601 117/73     Girls Systolic BP Percentile --      Girls Diastolic BP Percentile --  Boys Systolic BP Percentile --      Boys Diastolic BP Percentile --      Pulse Rate 09/05/24 1601 85     Resp 09/05/24 1601 16     Temp 09/05/24 1601 98.6 F (37 C)     Temp Source 09/05/24 1601 Oral     SpO2 09/05/24 1601 100 %     Weight --      Height --      Head Circumference --      Peak Flow --      Pain Score 09/05/24 1600 7     Pain Loc --      Pain Education --      Exclude from Growth Chart --    No data found.  Updated Vital Signs BP 117/73 (BP Location: Right Arm)   Pulse 85   Temp 98.6 F (37 C) (Oral)   Resp 16   LMP 07/16/2024 Comment: Patient has irrgular periods due to PCOS  SpO2 100%   Visual Acuity Right Eye Distance:   Left Eye Distance:   Bilateral Distance:    Right Eye Near:   Left Eye Near:    Bilateral Near:     Physical Exam Vitals and nursing note reviewed.  Constitutional:      General: She is not in acute distress.    Appearance: She is well-developed.  HENT:     Head: Normocephalic and atraumatic.     Right Ear: Tympanic membrane normal.     Left Ear: Tympanic membrane normal.     Nose: Congestion present.     Mouth/Throat:     Mouth: Mucous membranes are moist.     Pharynx: No posterior oropharyngeal erythema.  Eyes:     Conjunctiva/sclera: Conjunctivae normal.  Cardiovascular:     Rate and Rhythm: Normal rate and regular rhythm.     Heart sounds: No murmur heard. Pulmonary:     Effort: Pulmonary effort is normal. No tachypnea or respiratory distress.     Breath sounds: Examination of the right-middle  field reveals decreased breath sounds. Examination of the left-middle field reveals decreased breath sounds. Examination of the right-lower field reveals decreased breath sounds. Examination of the left-lower field reveals decreased breath sounds. Decreased breath sounds present. No wheezing or rhonchi.  Abdominal:     Palpations: Abdomen is soft.     Tenderness: There is no abdominal tenderness.  Musculoskeletal:        General: No swelling.     Cervical back: Neck supple.  Skin:    General: Skin is warm and dry.     Capillary Refill: Capillary refill takes less than 2 seconds.  Neurological:     General: No focal deficit present.     Mental Status: She is alert.  Psychiatric:        Mood and Affect: Mood normal.      UC Treatments / Results  Labs (all labs ordered are listed, but only abnormal results are displayed) Labs Reviewed  POC COVID19/FLU A&B COMBO    EKG   Radiology DG Chest 2 View Result Date: 09/05/2024 CLINICAL DATA:  acute cough, shortness of breath, chest tightness EXAM: CHEST - 2 VIEW COMPARISON:  02/12/2023 FINDINGS: Linear densities in the lung bases felt to reflect atelectasis. No confluent opacities or effusions. Heart and mediastinal contours within normal limits. No acute bony abnormality. IMPRESSION: Bibasilar atelectasis. Electronically Signed   By: Franky Crease M.D.   On: 09/05/2024 17:27    Procedures  Procedures (including critical care time)  Medications Ordered in UC Medications  albuterol  (VENTOLIN  HFA) 108 (90 Base) MCG/ACT inhaler 2 puff (has no administration in time range)    Initial Impression / Assessment and Plan / UC Course  I have reviewed the triage vital signs and the nursing notes.  Pertinent labs & imaging results that were available during my care of the patient were reviewed by me and considered in my medical decision making (see chart for details).     Lower respiratory infection  Acute cough - Plan: DG Chest 2 View, DG  Chest 2 View, POC Covid19/Flu A&B Antigen, POC Covid19/Flu A&B Antigen  Shortness of breath - Plan: DG Chest 2 View, DG Chest 2 View, POC Covid19/Flu A&B Antigen, POC Covid19/Flu A&B Antigen  Chest tightness - Plan: DG Chest 2 View, DG Chest 2 View, POC Covid19/Flu A&B Antigen, POC Covid19/Flu A&B Antigen   Flu A, flu B and COVID testing done today.  These are all negative.  Chest x-ray done today which shows some mild atelectasis in the bases but no pneumonia. Due to the symptoms we will treat for a lower respiratory infection.  Given the symptoms and physical exam findings as well as the x-ray findings, we will treat with the following: Azithromycin 250mg  Take 2 tablets today and the 1 tablet daily for 4 more days. Prednisone  40 mg (2 tablets) once daily for 5 days. Take this in the morning.  This is a steroid to help with inflammation and pain. Promethazine DM 5 mL every 8 hours as needed for cough.  Use caution as this medication can cause drowsiness. Hold potassium chloride  while taking this medication Albuterol  inhaler 1-2 puffs every 6 hours as needed for wheezing/shortness of breath. Make sure to stay hydrated by drinking plenty of water. Return to urgent care or PCP if symptoms worsen or fail to resolve.    Final Clinical Impressions(s) / UC Diagnoses   Final diagnoses:  Acute cough  Shortness of breath  Chest tightness  Lower respiratory infection     Discharge Instructions      Flu A, flu B and COVID testing done today.  These are all negative.  Chest x-ray done today which shows some mild atelectasis in the bases but no pneumonia. Due to the symptoms we will treat for a lower respiratory infection.  Given the symptoms and physical exam findings as well as the x-ray findings, we will treat with the following: Azithromycin 250mg  Take 2 tablets today and the 1 tablet daily for 4 more days. Prednisone  40 mg (2 tablets) once daily for 5 days. Take this in the morning.  This is a  steroid to help with inflammation and pain. Promethazine DM 5 mL every 8 hours as needed for cough.  Use caution as this medication can cause drowsiness. Hold potassium chloride  while taking this medication Albuterol  inhaler 1-2 puffs every 6 hours as needed for wheezing/shortness of breath. Make sure to stay hydrated by drinking plenty of water. Return to urgent care or PCP if symptoms worsen or fail to resolve.        ED Prescriptions     Medication Sig Dispense Auth. Provider   azithromycin (ZITHROMAX) 250 MG tablet Take first 2 tablets together, then 1 every day until finished. 6 tablet Brylie Sneath A, PA-C   predniSONE  (DELTASONE ) 20 MG tablet Take 2 tablets (40 mg total) by mouth daily with breakfast for 5 days. 10 tablet Teresa Norris A, PA-C   promethazine-dextromethorphan (PROMETHAZINE-DM)  6.25-15 MG/5ML syrup Take 5 mLs by mouth every 8 (eight) hours as needed for cough. 180 mL Teresa Almarie LABOR, [place]      PDMP not reviewed this encounter.   Teresa Almarie LABOR [place] 09/05/24 1758

## 2024-09-06 NOTE — Telephone Encounter (Signed)
 Called patient to ask how she was feeling and if she was still having the chest pain. Patient stated that she went back to UC due to the chest pain getting worse yesterday and had a CXR performed. Patient stated that She was given an inhaler and medications. Stated that she will see us  in the office next week for her appointment.

## 2024-09-07 NOTE — Telephone Encounter (Signed)
 Patient would like a work note for 3-days. Advised patient we will print note and put at the reg, desk.

## 2024-09-12 ENCOUNTER — Ambulatory Visit (INDEPENDENT_AMBULATORY_CARE_PROVIDER_SITE_OTHER): Admitting: Nurse Practitioner

## 2024-09-12 ENCOUNTER — Other Ambulatory Visit (HOSPITAL_COMMUNITY): Payer: Self-pay

## 2024-09-12 ENCOUNTER — Other Ambulatory Visit (HOSPITAL_COMMUNITY)
Admission: RE | Admit: 2024-09-12 | Discharge: 2024-09-12 | Disposition: A | Discharge status: Home / Self Care | Source: Ambulatory Visit | Attending: Nurse Practitioner | Admitting: Nurse Practitioner

## 2024-09-12 ENCOUNTER — Encounter: Payer: Self-pay | Admitting: Nurse Practitioner

## 2024-09-12 ENCOUNTER — Telehealth: Payer: Self-pay

## 2024-09-12 VITALS — BP 126/74 | HR 82 | Temp 98.2°F | Ht 67.0 in | Wt 221.6 lb

## 2024-09-12 DIAGNOSIS — E66811 Obesity, class 1: Secondary | ICD-10-CM | POA: Diagnosis not present

## 2024-09-12 DIAGNOSIS — K5903 Drug induced constipation: Secondary | ICD-10-CM

## 2024-09-12 DIAGNOSIS — N760 Acute vaginitis: Secondary | ICD-10-CM | POA: Insufficient documentation

## 2024-09-12 DIAGNOSIS — Z23 Encounter for immunization: Secondary | ICD-10-CM | POA: Diagnosis not present

## 2024-09-12 DIAGNOSIS — T502X5A Adverse effect of carbonic-anhydrase inhibitors, benzothiadiazides and other diuretics, initial encounter: Secondary | ICD-10-CM

## 2024-09-12 DIAGNOSIS — F411 Generalized anxiety disorder: Secondary | ICD-10-CM

## 2024-09-12 DIAGNOSIS — E876 Hypokalemia: Secondary | ICD-10-CM | POA: Diagnosis not present

## 2024-09-12 DIAGNOSIS — E6609 Other obesity due to excess calories: Secondary | ICD-10-CM

## 2024-09-12 DIAGNOSIS — Z6834 Body mass index (BMI) 34.0-34.9, adult: Secondary | ICD-10-CM

## 2024-09-12 DIAGNOSIS — F330 Major depressive disorder, recurrent, mild: Secondary | ICD-10-CM

## 2024-09-12 MED ORDER — FLUOXETINE HCL 10 MG PO CAPS: ORAL_CAPSULE | ORAL | 5 refills | Status: DC

## 2024-09-12 MED ORDER — LACTULOSE 20 G PO PACK: 20.0000 g | PACK | Freq: Every day | ORAL | 0 refills | Status: DC

## 2024-09-12 NOTE — Patient Instructions (Addendum)
 Continue to hold zepbound  Use lactulose for constipation Maintain a mediterranean diet and daily exercise Start fluoxetine for anxiety and depression Schedule appointment with therapist  Managing Depression, Adult Depression is a mental health condition that affects your thoughts, feelings, and actions. Being diagnosed with depression can bring you relief if you did not know why you have felt or behaved a certain way. It could also leave you feeling overwhelmed. Finding ways to manage your symptoms can help you feel more positive about your future. How to manage lifestyle changes Being depressed is difficult. Depression can increase the level of everyday stress. Stress can make depression symptoms worse. You may believe your symptoms cannot be managed or will never improve. However, there are many things you can try to help manage your symptoms. There is hope. Managing stress  Stress is your body's reaction to life changes and events, both good and bad. Stress can add to your feelings of depression. Learning to manage your stress can help lessen your feelings of depression. Try some of the following approaches to reducing your stress (stress reduction techniques): Listen to music that you enjoy and that inspires you. Try using a meditation app or take a meditation class. Develop a practice that helps you connect with your spiritual self. Walk in nature, pray, or go to a place of worship. Practice deep breathing. To do this, inhale slowly through your nose. Pause at the top of your inhale for a few seconds and then exhale slowly, letting yourself relax. Repeat this three or four times. Practice yoga to help relax and work your muscles. Choose a stress reduction technique that works for you. These techniques take time and practice to develop. Set aside 5-15 minutes a day to do them. Therapists can offer training in these techniques. Do these things to help manage stress: Keep a journal. Know your  limits. Set healthy boundaries for yourself and others, such as saying no when you think something is too much. Pay attention to how you react to certain situations. You may not be able to control everything, but you can change your reaction. Add humor to your life by watching funny movies or shows. Make time for activities that you enjoy and that relax you. Spend less time using electronics, especially at night before bed. The light from screens can make your brain think it is time to get up rather than go to bed.  Medicines Medicines, such as antidepressants, are often a part of treatment for depression. Talk with your pharmacist or health care provider about all the medicines, supplements, and herbal products that you take, their possible side effects, and what medicines and other products are safe to take together. Make sure to report any side effects you may have to your health care provider. Relationships Your health care provider may suggest family therapy, couples therapy, or individual therapy as part of your treatment. How to recognize changes Everyone responds differently to treatment for depression. As you recover from depression, you may start to: Have more interest in doing activities. Feel more hopeful. Have more energy. Eat a more regular amount of food. Have better mental focus. It is important to recognize if your depression is not getting better or is getting worse. The symptoms you had in the beginning may return, such as: Feeling tired. Eating too much or too little. Sleeping too much or too little. Feeling restless, agitated, or hopeless. Trouble focusing or making decisions. Having unexplained aches and pains. Feeling irritable, angry, or aggressive. If  you or your family members notice these symptoms coming back, let your health care provider know right away. Follow these instructions at home: Activity Try to get some form of exercise each day, such as  walking. Try yoga, mindfulness, or other stress reduction techniques. Participate in group activities if you are able. Lifestyle Get enough sleep. Cut down on or stop using caffeine, tobacco, alcohol, and any other harmful substances. Eat a healthy diet that includes plenty of vegetables, fruits, whole grains, low-fat dairy products, and lean protein. Limit foods that are high in solid fats, added sugar, or salt (sodium). General instructions Take over-the-counter and prescription medicines only as told by your health care provider. Keep all follow-up visits. It is important for your health care provider to check on your mood, behavior, and medicines. Your health care provider may need to make changes to your treatment. Where to find support Talking to others  Friends and family members can be sources of support and guidance. Talk to trusted friends or family members about your condition. Explain your symptoms and let them know that you are working with a health care provider to treat your depression. Tell friends and family how they can help. Finances Find mental health providers that fit with your financial situation. Talk with your health care provider if you are worried about access to food, housing, or medicine. Call your insurance company to learn about your co-pays and prescription plan. Where to find more information You can find support in your area from: Anxiety and Depression Association of America (ADAA): adaa.org Mental Health America: mentalhealthamerica.net The [employer] on Mental Illness: nami.org Contact a health care provider if: You stop taking your antidepressant medicines, and you have any of these symptoms: Nausea. Headache. Light-headedness. Chills and body aches. Not being able to sleep (insomnia). You or your friends and family think your depression is getting worse. Get help right away if: You have thoughts of hurting yourself or others. Get help right  away if you feel like you may hurt yourself or others, or have thoughts about taking your own life. Go to your nearest emergency room or: Call 911. Call the National Suicide Prevention Lifeline at (814) 217-2817 or 988. This is open 24 hours a day. Text the Crisis Text Line at 337 328 9887. This information is not intended to replace advice given to you by your health care provider. Make sure you discuss any questions you have with your health care provider. Document Revised: 03/17/2022 Document Reviewed: 03/17/2022 Elsevier Patient Education  2024 [employer].

## 2024-09-12 NOTE — Assessment & Plan Note (Signed)
 Reports increased anxiety, lack of motivation and fatigue in last 73months due to work stress- I hate my job. It is too stressful, but I can not quit Denies any SI/HI/hallucination Previous use of zoloft , she opted to discontinue med in 2024 because meed had improved  Sent fluoxetine today due to low energy and weight gain neutral. She agreed to psychology referral F/up in 73month

## 2024-09-12 NOTE — Assessment & Plan Note (Addendum)
 Weight gain in last 2months: 11lbs Reports she stopped zepbound  2weeks ago due to fear of GI side effects. She also reports constipation and diarrhea in last 1week. She has not used any OVER THE COUNTER med. She has not made any diet modifications, nor maintained about consistent exercise regimen Wt Readings from Last 3 Encounters:  09/12/24 221 lb 9.6 oz (100.5 kg)  07/18/24 209 lb (94.8 kg)  06/26/24 212 lb (96.2 kg)    Continue to hold zepbound  Sent lactulose Advised to maintain a high fiber, low carb/low sugar, low fiber diet Advised to start daily exercise F/up in 1months

## 2024-09-12 NOTE — Telephone Encounter (Signed)
 Pharmacy Patient Advocate Encounter  Insurance verification completed.   The patient is insured through [facility] pt has 2 insurance Systems [employer])   Ran test claim for Lactulose 20GM packets. Currently a quantity of 15 is a 15 day supply and the co-pay is 4.00 . No PA needed at this time.  PLEASE BE ADVISED  CALLED PHARMACY AND DRUG WAS OUT OF STOCK WILL ORDER AND LET THE PT KNOW WHEN IT IS READY.

## 2024-09-12 NOTE — Progress Notes (Signed)
 Established Patient Visit  Patient: Jessica Greene   DOB: Feb 04, 1986   38 y.o. Female  MRN: [id number] Visit Date: 09/12/2024  Subjective:    Chief Complaint  Patient presents with   Follow-up    8 week follow up for weight management    HPI Obesity Weight gain in last 58months: 11lbs Reports she stopped zepbound  2weeks ago due to fear of GI side effects. She also reports constipation and diarrhea in last 1week. She has not used any OVER THE COUNTER med. She has not made any diet modifications, nor maintained about consistent exercise regimen Wt Readings from Last 3 Encounters:  09/12/24 221 lb 9.6 oz (100.5 kg)  07/18/24 209 lb (94.8 kg)  06/26/24 212 lb (96.2 kg)    Continue to hold zepbound  Sent lactulose Advised to maintain a high fiber, low carb/low sugar, low fiber diet Advised to start daily exercise F/up in 1months  GAD (generalized anxiety disorder) Reports increased anxiety, lack of motivation and fatigue in last 58months due to work stress- I hate my job. It is too stressful, but I can not quit Denies any SI/HI/hallucination Previous use of zoloft , she opted to discontinue med in 2024 because meed had improved  Sent fluoxetine today due to low energy and weight gain neutral. She agreed to psychology referral F/up in 58month     09/12/2024   10:25 AM 06/26/2024    3:38 PM 04/18/2024   10:50 AM  Depression screen PHQ 2/9  Decreased Interest 1 0 1  Down, Depressed, Hopeless 1 1 1   PHQ - 2 Score 2 1 2   Altered sleeping 3 3 3   Tired, decreased energy 2 2 1   Change in appetite 1 0 0  Feeling bad or failure about yourself  0 0 0  Trouble concentrating 0 0 0  Moving slowly or fidgety/restless 0 0 0  Suicidal thoughts 0 0 0  PHQ-9 Score 8 6 6   Difficult doing work/chores Somewhat difficult  Somewhat difficult       09/12/2024   10:25 AM 06/26/2024    3:37 PM 04/18/2024   10:50 AM 11/12/2023   11:19 AM  GAD 7 : Generalized Anxiety Score   Nervous, Anxious, on Edge 1 1 2 1   Control/stop worrying 1 1 2 1   Worry too much - different things -- 1 1 0  Trouble relaxing 1 1 1 1   Restless -- 0 0 0  Easily annoyed or irritable 2 1 1  0  Afraid - awful might happen 1 0 1 1  Total GAD 7 Score  5 8 4   Anxiety Difficulty Somewhat difficult  Somewhat difficult Not difficult at all     Reviewed medical, surgical, and social history today  Medications: Outpatient Medications Prior to Visit  Medication Sig   fluticasone  (FLONASE ) 50 MCG/ACT nasal spray Place 2 sprays into both nostrils daily.   Iron , Ferrous Sulfate , 325 (65 Fe) MG TABS Take 325 mg by mouth every other day.   omeprazole  (PRILOSEC) 20 MG capsule Take 1 capsule (20 mg total) by mouth in the morning.   potassium chloride  SA (KLOR-CON  M) 20 MEQ tablet Take 1 tablet (20 mEq total) by mouth daily.   pravastatin  (PRAVACHOL ) 20 MG tablet Take 1 tablet (20 mg total) by mouth at bedtime.   triamterene -hydrochlorothiazide  (MAXZIDE-25) 37.5-25 MG tablet Take 0.5 tablets by mouth daily.   [DISCONTINUED] ZEPBOUND  10 MG/0.5ML Pen Inject 10 mg into  the skin once a week.   Drospirenone  (SLYND ) 4 MG TABS Take 1 tablet (4 mg total) by mouth daily.   [DISCONTINUED] azithromycin (ZITHROMAX) 250 MG tablet Take first 2 tablets together, then 1 every day until finished. (Patient not taking: Reported on 09/12/2024)   [DISCONTINUED] benzonatate  (TESSALON  PERLES) 100 MG capsule Take 1 capsule (100 mg total) by mouth 3 (three) times daily as needed.   [DISCONTINUED] metroNIDAZOLE  (METROGEL ) 0.75 % vaginal gel Place 1 Applicatorful vaginally at bedtime. (Patient not taking: Reported on 09/12/2024)   [DISCONTINUED] predniSONE  (DELTASONE ) 20 MG tablet Take 2 tablets (40 mg total) by mouth daily with breakfast for 5 days. (Patient not taking: Reported on 09/12/2024)   [DISCONTINUED] promethazine-dextromethorphan (PROMETHAZINE-DM) 6.25-15 MG/5ML syrup Take 5 mLs by mouth every 8 (eight) hours as needed  for cough.   No facility-administered medications prior to visit.   Reviewed past medical and social history.   ROS per HPI above      Objective:  BP 126/74 (BP Location: Left Arm, Patient Position: Sitting, Cuff Size: Large)   Pulse 82   Temp 98.2 F (36.8 C) (Oral)   Ht 5' 7 (1.702 m)   Wt 221 lb 9.6 oz (100.5 kg)   LMP 07/16/2024 Comment: Patient has irrgular periods due to PCOS  SpO2 98%   BMI 34.71 kg/m      Physical Exam Vitals and nursing note reviewed.  Constitutional:      Appearance: She is obese.  Cardiovascular:     Rate and Rhythm: Normal rate and regular rhythm.     Pulses: Normal pulses.     Heart sounds: Normal heart sounds.  Pulmonary:     Effort: Pulmonary effort is normal.     Breath sounds: Normal breath sounds.  Neurological:     Mental Status: She is alert and oriented to person, place, and time.     No results found for any visits on 09/12/24.    Assessment & Plan:    Problem List Items Addressed This Visit     GAD (generalized anxiety disorder) - Primary   Reports increased anxiety, lack of motivation and fatigue in last 99months due to work stress- I hate my job. It is too stressful, but I can not quit Denies any SI/HI/hallucination Previous use of zoloft , she opted to discontinue med in 2024 because meed had improved  Sent fluoxetine today due to low energy and weight gain neutral. She agreed to psychology referral F/up in 99month      Relevant Medications   FLUoxetine (PROZAC) 10 MG capsule   Other Relevant Orders   Ambulatory referral to Psychology   Obesity   Weight gain in last 99months: 11lbs Reports she stopped zepbound  2weeks ago due to fear of GI side effects. She also reports constipation and diarrhea in last 1week. She has not used any OVER THE COUNTER med. She has not made any diet modifications, nor maintained about consistent exercise regimen Wt Readings from Last 3 Encounters:  09/12/24 221 lb 9.6 oz (100.5 kg)   07/18/24 209 lb (94.8 kg)  06/26/24 212 lb (96.2 kg)    Continue to hold zepbound  Sent lactulose Advised to maintain a high fiber, low carb/low sugar, low fiber diet Advised to start daily exercise F/up in 1months      Other Visit Diagnoses       Mild episode of recurrent major depressive disorder       Relevant Medications   FLUoxetine (PROZAC) 10 MG capsule   Other  Relevant Orders   Ambulatory referral to Psychology     Diuretic-induced hypokalemia         Drug-induced constipation       Relevant Medications   lactulose (CEPHULAC) 20 g packet     Recurrent vaginitis       Relevant Orders   Cervicovaginal ancillary only( [place])     Immunization due       Relevant Orders   HPV 9-valent vaccine,Recombinat (Completed)      Return in about 4 weeks (around 10/10/2024) for depression and anxiety, Weight management, hyperlipidemia (fasting).     Roselie Mood, NP

## 2024-09-13 ENCOUNTER — Ambulatory Visit: Payer: Self-pay | Admitting: Nurse Practitioner

## 2024-09-13 DIAGNOSIS — B3731 Acute candidiasis of vulva and vagina: Secondary | ICD-10-CM

## 2024-09-13 LAB — CERVICOVAGINAL ANCILLARY ONLY
Bacterial Vaginitis (gardnerella): NEGATIVE
Candida Glabrata: POSITIVE — AB
Candida Vaginitis: NEGATIVE
Chlamydia: NEGATIVE
Comment: NEGATIVE
Comment: NEGATIVE
Comment: NEGATIVE
Comment: NEGATIVE
Comment: NEGATIVE
Comment: NORMAL
Neisseria Gonorrhea: NEGATIVE
Trichomonas: NEGATIVE

## 2024-09-13 MED ORDER — FLUCONAZOLE 150 MG PO TABS: 150.0000 mg | ORAL_TABLET | Freq: Every day | ORAL | 0 refills | Status: DC

## 2024-09-19 ENCOUNTER — Ambulatory Visit (INDEPENDENT_AMBULATORY_CARE_PROVIDER_SITE_OTHER)

## 2024-09-19 ENCOUNTER — Ambulatory Visit: Payer: Self-pay | Admitting: Podiatry

## 2024-09-19 VITALS — Ht 67.0 in | Wt 221.6 lb

## 2024-09-19 DIAGNOSIS — M216X2 Other acquired deformities of left foot: Secondary | ICD-10-CM

## 2024-09-19 DIAGNOSIS — M216X1 Other acquired deformities of right foot: Secondary | ICD-10-CM | POA: Diagnosis not present

## 2024-09-19 DIAGNOSIS — M79672 Pain in left foot: Secondary | ICD-10-CM | POA: Diagnosis not present

## 2024-09-19 NOTE — Patient Instructions (Signed)
  VISIT SUMMARY: During your visit, we discussed the painful knot in your left foot and determined it to be a plantar fibroma. We reviewed your symptoms, work habits, and medical history, including your history of GERD.  YOUR PLAN: -PLANTAR FIBROMA OF LEFT FOOT: A plantar fibroma is a benign knot of fibrous tissue in the arch of the foot. It can cause pain and a burning sensation, especially when pressure is applied. We administered a steroid injection to help reduce the pain and inflammation. You should wear shoes with cushioning and soft insoles for support. You may consider taking NSAIDs like ibuprofen  for pain management, but avoid high doses due to your GERD. If symptoms persist and other treatments fail, we may discuss the option of surgical excision, though this comes with risks of recurrence and scarring.  INSTRUCTIONS: Please follow up if the pain persists or worsens. Consider scheduling an appointment if you experience any new symptoms or have concerns about the treatment plan.                      Contains text generated by [employer].                                 Contains text generated by [employer]bridge.

## 2024-09-19 NOTE — Progress Notes (Signed)
  Subjective:  Patient ID: Jessica Greene, female    DOB: 1986/05/03,  MRN: [id number]  Chief Complaint  Patient presents with   Foot Problem    Rm 1 The knot in the bottom of my left foot. It hurts and sends a sensation throughout my foot that makes me uncomfortable. Also what can I do about my flat feet?    Discussed the use of AI scribe software for clinical note transcription with the patient, who gave verbal consent to proceed.  History of Present Illness Jessica Greene is a 38 year old female who presents with a painful knot in her left foot.  She has a painful knot in her left foot, causing a burning sensation. The knot has been present for some time, but she has only recently sought medical attention. She works long hours, ranging from ten and a half to fourteen hours a day, and is unsure if this is exacerbating the issue.  She has not applied any treatments to the area and has not taken any medications like Aleve  or Motrin  for the pain. The sensation is painful when pressure is applied.  She has a history of GERD. No issues with heartburn or gastric reflux. She is not diabetic.      Objective:    Physical Exam VASCULAR: DP and PT pulse palpable. Foot is warm and well-perfused. Capillary fill time is brisk. DERMATOLOGIC: Normal skin turgor, texture, and temperature. No open lesions, rashes, or ulcerations. No skin lesion associated with the plantar fibroma. NEUROLOGIC: Normal sensation to light touch and pressure. No paresthesias on examination. ORTHOPEDIC: Smooth, pain-free range of motion of all examined joints. No ecchymosis or bruising. No gross deformity. No pain to palpation. Small plantar fibroma in the medial band of the plantar fascia on the left foot.   Radiographs taken today show no bony abnormality in the area of concern    Results Procedure: Corticosteroid injection   Description: The left plantar fibroma was injected with 40 mg of Kenalog  and  0.5 cc of local anesthetic. The procedure was well-tolerated, and the site was dressed with an adhesive bandage.   Assessment:   1. Pronation of left foot   2. Pronation of right foot      Plan:  Patient was evaluated and treated and all questions answered.  Assessment and Plan Assessment & Plan Plantar fibroma of left foot Small plantar fibroma in the medial band of the left plantar fascia causing pain and burning sensation. Benign condition with no malignancy association. - Administer 40 mg of Kenalog  with 0.5 cc for steroid injection into the left plantar fibroma, with informed consent obtained and cold spray used to minimize discomfort. - Advise use of shoes with cushioning and soft insoles for support. - Consider NSAIDs like ibuprofen  for pain management, avoiding high doses due to GERD. - Discuss potential surgical excision if symptoms persist and other treatments fail, noting risks of recurrence and scarring.      Return if symptoms worsen or fail to improve.

## 2024-09-21 ENCOUNTER — Encounter: Payer: Self-pay | Admitting: Licensed Clinical Social Worker

## 2024-09-21 ENCOUNTER — Ambulatory Visit (INDEPENDENT_AMBULATORY_CARE_PROVIDER_SITE_OTHER): Admitting: Licensed Clinical Social Worker

## 2024-09-21 DIAGNOSIS — F4323 Adjustment disorder with mixed anxiety and depressed mood: Secondary | ICD-10-CM | POA: Diagnosis not present

## 2024-09-21 DIAGNOSIS — F333 Major depressive disorder, recurrent, severe with psychotic symptoms: Secondary | ICD-10-CM
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
## 2024-10-11 ENCOUNTER — Ambulatory Visit (INDEPENDENT_AMBULATORY_CARE_PROVIDER_SITE_OTHER): Admitting: Professional

## 2024-10-11 ENCOUNTER — Encounter: Payer: Self-pay | Admitting: Professional

## 2024-10-11 DIAGNOSIS — F411 Generalized anxiety disorder: Secondary | ICD-10-CM | POA: Diagnosis not present

## 2024-10-11 DIAGNOSIS — F331 Major depressive disorder, recurrent, moderate: Secondary | ICD-10-CM

## 2024-10-11 DIAGNOSIS — F4310 Post-traumatic stress disorder, unspecified: Secondary | ICD-10-CM

## 2024-10-11 NOTE — Progress Notes (Deleted)
   Nathanel Collet, [facility]
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
## 2024-10-16 ENCOUNTER — Encounter: Payer: Self-pay | Admitting: Nurse Practitioner

## 2024-10-16 ENCOUNTER — Ambulatory Visit: Admitting: Nurse Practitioner

## 2024-10-16 ENCOUNTER — Ambulatory Visit: Payer: Self-pay | Admitting: Nurse Practitioner

## 2024-10-16 ENCOUNTER — Telehealth: Payer: Self-pay

## 2024-10-16 VITALS — BP 100/80 | HR 88 | Temp 98.3°F | Ht 67.0 in | Wt 209.8 lb

## 2024-10-16 DIAGNOSIS — E876 Hypokalemia: Secondary | ICD-10-CM

## 2024-10-16 DIAGNOSIS — D500 Iron deficiency anemia secondary to blood loss (chronic): Secondary | ICD-10-CM | POA: Diagnosis not present

## 2024-10-16 DIAGNOSIS — F411 Generalized anxiety disorder: Secondary | ICD-10-CM

## 2024-10-16 DIAGNOSIS — Z6834 Body mass index (BMI) 34.0-34.9, adult: Secondary | ICD-10-CM

## 2024-10-16 DIAGNOSIS — L2082 Flexural eczema: Secondary | ICD-10-CM

## 2024-10-16 DIAGNOSIS — T502X5A Adverse effect of carbonic-anhydrase inhibitors, benzothiadiazides and other diuretics, initial encounter: Secondary | ICD-10-CM | POA: Diagnosis not present

## 2024-10-16 DIAGNOSIS — I10 Essential (primary) hypertension: Secondary | ICD-10-CM

## 2024-10-16 DIAGNOSIS — Z23 Encounter for immunization: Secondary | ICD-10-CM | POA: Diagnosis not present

## 2024-10-16 DIAGNOSIS — R7303 Prediabetes: Secondary | ICD-10-CM

## 2024-10-16 DIAGNOSIS — E559 Vitamin D deficiency, unspecified: Secondary | ICD-10-CM

## 2024-10-16 DIAGNOSIS — F330 Major depressive disorder, recurrent, mild: Secondary | ICD-10-CM

## 2024-10-16 DIAGNOSIS — E6609 Other obesity due to excess calories: Secondary | ICD-10-CM

## 2024-10-16 DIAGNOSIS — E66811 Obesity, class 1: Secondary | ICD-10-CM

## 2024-10-16 LAB — RENAL FUNCTION PANEL
Albumin: 4.4 g/dL (ref 3.5–5.2)
BUN: 10 mg/dL (ref 6–23)
CO2: 30 meq/L (ref 19–32)
Calcium: 9 mg/dL (ref 8.4–10.5)
Chloride: 100 meq/L (ref 96–112)
Creatinine, Ser: 0.72 mg/dL (ref 0.40–1.20)
GFR: 105.34 mL/min (ref >60.00)
Glucose, Bld: 77 mg/dL (ref 70–99)
Phosphorus: 3.5 mg/dL (ref 2.3–4.6)
Potassium: 2.8 meq/L — CL (ref 3.5–5.1)
Sodium: 138 meq/L (ref 135–145)

## 2024-10-16 LAB — IBC + FERRITIN
Ferritin: 5.4 ng/mL — ABNORMAL LOW (ref 10.0–291.0)
Iron: 42 ug/dL (ref 42–145)
Saturation Ratios: 9.5 % — ABNORMAL LOW (ref 20.0–50.0)
TIBC: 441 ug/dL (ref 250.0–450.0)
Transferrin: 315 mg/dL (ref 212.0–360.0)

## 2024-10-16 LAB — HEMOGLOBIN A1C: Hgb A1c MFr Bld: 5.7 % (ref 4.6–6.5)

## 2024-10-16 LAB — VITAMIN D 25 HYDROXY (VIT D DEFICIENCY, FRACTURES): VITD: 17.85 ng/mL — ABNORMAL LOW (ref 30.00–100.00)

## 2024-10-16 MED ORDER — POTASSIUM CHLORIDE CRYS ER 20 MEQ PO TBCR: 20.0000 meq | EXTENDED_RELEASE_TABLET | Freq: Two times a day (BID) | ORAL | 0 refills | Status: AC

## 2024-10-16 MED ORDER — LISINOPRIL 10 MG PO TABS: 10.0000 mg | ORAL_TABLET | Freq: Every day | ORAL | 3 refills | Status: AC

## 2024-10-16 MED ORDER — TRIAMCINOLONE ACETONIDE 0.1 % EX CREA: 1.0000 | TOPICAL_CREAM | Freq: Two times a day (BID) | CUTANEOUS | 1 refills | Status: DC

## 2024-10-16 MED ORDER — VITAMIN D (ERGOCALCIFEROL) 1.25 MG (50000 UNIT) PO CAPS: 50000.0000 [IU] | ORAL_CAPSULE | ORAL | 0 refills | Status: AC

## 2024-10-16 MED ORDER — FLUOXETINE HCL 20 MG PO CAPS: 20.0000 mg | ORAL_CAPSULE | Freq: Every day | ORAL | 3 refills | Status: AC

## 2024-10-16 NOTE — Assessment & Plan Note (Signed)
 Lost 12lbs in last 85month with diet modification and increased exercise frequency. Resolved diarrhea and constipation. Wt Readings from Last 3 Encounters:  10/16/24 209 lb 12.8 oz (95.2 kg)  09/19/24 221 lb 9.6 oz (100.5 kg)  09/12/24 221 lb 9.6 oz (100.5 kg)    Advised to maintain lifestyle modification, and be mindful about avoiding high calorie snacks. F/up in 3months

## 2024-10-16 NOTE — Assessment & Plan Note (Signed)
 Improved and stable mood with fluoxetine  and CBT sessions F/up in 3months

## 2024-10-16 NOTE — Assessment & Plan Note (Addendum)
 Repeat ibc and ferritin Very low ferritin and iron : resume OVER THE COUNTER ferrous sulfate  1tab daily with a glass of orange juice. Repeat in 1-64months

## 2024-10-16 NOTE — Progress Notes (Addendum)
 Established Patient Visit  Patient: Jessica Greene   DOB: [birth year]   38 y.o. Female  MRN: [id number] Visit Date: 10/16/2024  Subjective:    Chief Complaint  Patient presents with   Anxiety   Depression   Hyperlipidemia    Pt is not fasting. Ate a bag of chips ago.   Weight Management Screening    No concerns.    HTN (hypertension) BP at goal with maxzide BP Readings from Last 3 Encounters:  10/16/24 100/80  09/12/24 126/74  09/05/24 117/73    Repeat BMP Stop maxzide due to persistent hypokalemia. Start lisinopril  10mg  daily Very low potassium with normal renal function. Stop maxzide. Increase potassium to 20mEq BID x 1week. Start lisinopril  10mg  daily for HYPERTENSION control. Schedule lab appointment to repeat potassium level in 1week. F/up in 3months  Prediabetes Repeat hgbA1c: 5.7% improved Advised to maintain a low carb/low fat diet  Obesity Lost 12lbs in last 71month with diet modification and increased exercise frequency. Resolved diarrhea and constipation. Wt Readings from Last 3 Encounters:  10/16/24 209 lb 12.8 oz (95.2 kg)  09/19/24 221 lb 9.6 oz (100.5 kg)  09/12/24 221 lb 9.6 oz (100.5 kg)    Advised to maintain lifestyle modification, and be mindful about avoiding high calorie snacks. F/up in 3months  Iron  deficiency anemia due to chronic blood loss Repeat ibc and ferritin Very low ferritin and iron : resume OVER THE COUNTER ferrous sulfate  1tab daily with a glass of orange juice. Repeat in 1-17months  GAD (generalized anxiety disorder) Improved and stable mood with fluoxetine  and CBT sessions F/up in 3months  Wt Readings from Last 3 Encounters:  10/16/24 209 lb 12.8 oz (95.2 kg)  09/19/24 221 lb 9.6 oz (100.5 kg)  09/12/24 221 lb 9.6 oz (100.5 kg)    BP Readings from Last 3 Encounters:  10/16/24 100/80  09/12/24 126/74  09/05/24 117/73       10/16/2024    9:28 AM 10/11/2024   11:14 AM 09/12/2024   10:25 AM   Depression screen PHQ 2/9  Decreased Interest 1  1  Down, Depressed, Hopeless 0  1  PHQ - 2 Score 1  2  Altered sleeping 3  3  Tired, decreased energy 3  2  Change in appetite 0  1  Feeling bad or failure about yourself  0  0  Trouble concentrating 0  0  Moving slowly or fidgety/restless 0  0  Suicidal thoughts 0  0  PHQ-9 Score 7  8   Difficult doing work/chores Somewhat difficult  Somewhat difficult     Information is confidential and restricted. Go to Review Flowsheets to unlock data.   Data saved with a previous flowsheet row definition       10/16/2024    9:29 AM 09/12/2024   10:25 AM 06/26/2024    3:37 PM 04/18/2024   10:50 AM  GAD 7 : Generalized Anxiety Score  Nervous, Anxious, on Edge 1 1 1 2   Control/stop worrying 1 1 1 2   Worry too much - different things  -- 1 1  Trouble relaxing 3 1 1 1   Restless 0 -- 0 0  Easily annoyed or irritable 2 2 1 1   Afraid - awful might happen 1 1 0 1  Total GAD 7 Score   5 8  Anxiety Difficulty Somewhat difficult Somewhat difficult  Somewhat difficult    Reviewed medical, surgical, and social  history today  Medications: Outpatient Medications Prior to Visit  Medication Sig   Drospirenone  (SLYND ) 4 MG TABS Take 1 tablet (4 mg total) by mouth daily.   fluticasone  (FLONASE ) 50 MCG/ACT nasal spray Place 2 sprays into both nostrils daily.   Iron , Ferrous Sulfate , 325 (65 Fe) MG TABS Take 325 mg by mouth every other day.   omeprazole  (PRILOSEC) 20 MG capsule Take 1 capsule (20 mg total) by mouth in the morning.   pravastatin  (PRAVACHOL ) 20 MG tablet Take 1 tablet (20 mg total) by mouth at bedtime.   [DISCONTINUED] FLUoxetine  (PROZAC ) 10 MG capsule Take 1cap daily in AM x 2weeks, then 2caps daily continuously   [DISCONTINUED] lactulose  (CEPHULAC ) 20 g packet Take 1 packet (20 g total) by mouth at bedtime.   [DISCONTINUED] potassium chloride  SA (KLOR-CON  M) 20 MEQ tablet Take 1 tablet (20 mEq total) by mouth daily.   [DISCONTINUED]  triamterene -hydrochlorothiazide  (MAXZIDE-25) 37.5-25 MG tablet Take 0.5 tablets by mouth daily.   [DISCONTINUED] fluconazole  (DIFLUCAN ) 150 MG tablet Take 1 tablet (150 mg total) by mouth daily. Take second tab 3days apart from first tab (Patient not taking: Reported on 10/16/2024)   No facility-administered medications prior to visit.   Reviewed past medical and social history.   ROS per HPI above      Objective:  BP 100/80 (BP Location: Right Arm, Patient Position: Sitting)   Pulse 88   Temp 98.3 F (36.8 C) (Oral)   Ht 5' 7 (1.702 m)   Wt 209 lb 12.8 oz (95.2 kg)   LMP 10/07/2024   SpO2 99%   BMI 32.86 kg/m      Physical Exam Vitals and nursing note reviewed.  Cardiovascular:     Rate and Rhythm: Normal rate and regular rhythm.     Pulses: Normal pulses.     Heart sounds: Normal heart sounds.  Pulmonary:     Effort: Pulmonary effort is normal.     Breath sounds: Normal breath sounds.  Neurological:     Mental Status: She is alert and oriented to person, place, and time.  Psychiatric:        Mood and Affect: Mood normal.        Behavior: Behavior normal.        Thought Content: Thought content normal.     Results for orders placed or performed in visit on 10/16/24  Renal Function Panel  Result Value Ref Range   Sodium 138 135 - 145 mEq/L   Potassium 2.8 (LL) 3.5 - 5.1 mEq/L   Chloride 100 96 - 112 mEq/L   CO2 30 19 - 32 mEq/L   Albumin 4.4 3.5 - 5.2 g/dL   BUN 10 6 - 23 mg/dL   Creatinine, Ser 9.27 0.40 - 1.20 mg/dL   Glucose, Bld 77 70 - 99 mg/dL   Phosphorus 3.5 2.3 - 4.6 mg/dL   GFR 894.65 >39.99 mL/min   Calcium 9.0 8.4 - 10.5 mg/dL  Hemoglobin J8r  Result Value Ref Range   Hgb A1c MFr Bld 5.7 4.6 - 6.5 %  IBC + Ferritin  Result Value Ref Range   Iron  42 42 - 145 ug/dL   Transferrin 684.9 787.9 - 360.0 mg/dL   Saturation Ratios 9.5 (L) 20.0 - 50.0 %   Ferritin 5.4 (L) 10.0 - 291.0 ng/mL   TIBC 441.0 250.0 - 450.0 mcg/dL  VITAMIN D  25 Hydroxy  (Vit-D Deficiency, Fractures)  Result Value Ref Range   VITD 17.85 (L) 30.00 - 100.00 ng/mL  Assessment & Plan:    Problem List Items Addressed This Visit     GAD (generalized anxiety disorder)   Improved and stable mood with fluoxetine  and CBT sessions F/up in 3months      Relevant Medications   FLUoxetine  (PROZAC ) 20 MG capsule   HTN (hypertension)   BP at goal with maxzide BP Readings from Last 3 Encounters:  10/16/24 100/80  09/12/24 126/74  09/05/24 117/73    Repeat BMP Stop maxzide due to persistent hypokalemia. Start lisinopril  10mg  daily Very low potassium with normal renal function. Stop maxzide. Increase potassium to 20mEq BID x 1week. Start lisinopril  10mg  daily for HYPERTENSION control. Schedule lab appointment to repeat potassium level in 1week. F/up in 3months      Relevant Orders   Renal Function Panel (Completed)   Iron  deficiency anemia due to chronic blood loss   Repeat ibc and ferritin Very low ferritin and iron : resume OVER THE COUNTER ferrous sulfate  1tab daily with a glass of orange juice. Repeat in 1-48months      Relevant Orders   IBC + Ferritin (Completed)   Obesity   Lost 12lbs in last 72month with diet modification and increased exercise frequency. Resolved diarrhea and constipation. Wt Readings from Last 3 Encounters:  10/16/24 209 lb 12.8 oz (95.2 kg)  09/19/24 221 lb 9.6 oz (100.5 kg)  09/12/24 221 lb 9.6 oz (100.5 kg)    Advised to maintain lifestyle modification, and be mindful about avoiding high calorie snacks. F/up in 3months      Prediabetes   Repeat hgbA1c: 5.7% improved Advised to maintain a low carb/low fat diet      Relevant Orders   Hemoglobin A1c (Completed)   Vitamin D  deficiency   Relevant Orders   VITAMIN D  25 Hydroxy (Vit-D Deficiency, Fractures) (Completed)   Other Visit Diagnoses       Flexural eczema    -  Primary   Relevant Medications   triamcinolone  cream (KENALOG ) 0.1 %     Diuretic-induced  hypokalemia       Relevant Orders   Renal Function Panel (Completed)     Immunization due       Relevant Orders   HPV 9-valent vaccine,Recombinat (Completed)     Mild episode of recurrent major depressive disorder       Relevant Medications   FLUoxetine  (PROZAC ) 20 MG capsule      Return in about 3 months (around 01/16/2025) for HTN, prediabetes, hyperlipidemia (fasting).     Roselie Mood, NP

## 2024-10-16 NOTE — Assessment & Plan Note (Addendum)
 BP at goal with maxzide BP Readings from Last 3 Encounters:  10/16/24 100/80  09/12/24 126/74  09/05/24 117/73    Repeat BMP Stop maxzide due to persistent hypokalemia. Start lisinopril  10mg  daily Very low potassium with normal renal function. Stop maxzide. Increase potassium to 20mEq BID x 1week. Start lisinopril  10mg  daily for HYPERTENSION control. Schedule lab appointment to repeat potassium level in 1week. F/up in 3months

## 2024-10-16 NOTE — Patient Instructions (Signed)
 Go to lab Maintain Heart healthy diet and daily exercise. Maintain current medications.  How to Increase Your Level of Physical Activity Getting regular physical activity is important for your overall health and well-being. Most people do not get enough exercise. There are easy ways to increase your level of physical activity, even if you have not been very active in the past or if you are just starting out. What are the benefits of physical activity? Physical activity has many short-term and long-term benefits. Being active on a regular basis can improve your physical and mental health as well as provide other benefits. Physical health benefits Helping you lose weight or maintain a healthy weight. Strengthening your muscles and bones. Reducing your risk of certain long-term (chronic) diseases, including heart disease, cancer, and diabetes. Being able to move around more easily and for longer periods of time without getting tired (increased endurance or stamina). Improving your ability to fight off illness (enhanced immunity). Being able to sleep better. Helping you stay healthy as you get older, including: Helping you stay mobile, or capable of walking and moving around. Preventing accidents, such as falls. Increasing life expectancy. Mental health benefits Boosting your mood and improving your self-esteem. Lowering your chance of having mental health problems, such as depression or anxiety. Helping you feel good about your body. Other benefits Finding new sources of fun and enjoyment. Meeting new people who share a common interest. Before you begin If you have a chronic illness or have not been active for a while, check with your health care provider about how to get started. Ask your health care provider what activities are safe for you. Start out slowly. Walking or doing some simple chair exercises is a good place to start, especially if you have not been active before or for a long  time. Set goals that you can work toward. Ask your health care provider how much exercise is best for you. In general, most adults should: Do moderate-intensity exercise for at least 150 minutes each week (30 minutes on most days of the week) or vigorous exercise for at least 75 minutes each week, or a combination of these. Moderate-intensity exercise can include walking at a quick pace, biking, yoga, water aerobics, or gardening. Vigorous exercise involves activities that take more effort, such as jogging or running, playing sports, swimming laps, or jumping rope. Do strength exercises on at least 2 days each week. This can include weight lifting, body weight exercises, and resistance-band exercises. How to be more physically active Make a plan  Try to find activities that you enjoy. You are more likely to commit to an exercise routine if it does not feel like a chore. If you have bone or joint problems, choose low-impact exercises, like walking or swimming. Use these tips for being successful with an exercise plan: Find a workout partner for accountability. Join a group or class, such as an aerobics class, cycling class, or sports team. Make family time active. Go for a walk, bike, or swim. Include a variety of exercises each week. Consider using a fitness tracker, such as a mobile phone app or a device worn like a watch, that will count the number of steps you take each day. Many people strive to reach 10,000 steps a day. Find ways to be active in your daily routines Besides your formal exercise plans, you can find ways to do physical activity during your daily routines, such as: Walking or biking to work or to the store. Taking  the stairs instead of the elevator. Parking farther away from the door at work or at the store. Planning walking meetings. Walking around while you are on the phone. Where to find more information Centers for Disease Control and Prevention:  [employer] President's Council on Fitness, Sports & Nutrition: www.fitness.gov ChooseMyPlate: http://www.harvey.com/ Contact a health care provider if: You have headaches, muscle aches, or joint pain that is concerning. You feel dizzy or light-headed while exercising. You faint. You feel your heart skipping, racing, or fluttering. You have chest pain while exercising. Summary Exercise benefits your mind and body at any age, even if you are just starting out. If you have a chronic illness or have not been active for a while, check with your health care provider before increasing your physical activity. Choose activities that are safe and enjoyable for you. Ask your health care provider what activities are safe for you. Start slowly. Tell your health care provider if you have problems as you start to increase your activity level. This information is not intended to replace advice given to you by your health care provider. Make sure you discuss any questions you have with your health care provider. Document Revised: 03/07/2021 Document Reviewed: 03/07/2021 Elsevier Patient Education  2024 [employer].

## 2024-10-16 NOTE — Assessment & Plan Note (Addendum)
 Repeat hgbA1c: 5.7% improved Advised to maintain a low carb/low fat diet

## 2024-10-16 NOTE — Telephone Encounter (Signed)
 CRITICAL VALUE STICKER  CRITICAL VALUE: K 2.8  RECEIVER (on-site recipient of call): Nur Krasinski  DATE & TIME NOTIFIED: 10/16/24 @ 1414  MESSENGER (representative from lab): Hope  MD NOTIFIED: Nche  TIME OF NOTIFICATION: 1415  RESPONSE:  Provider aware for review.

## 2024-10-17 NOTE — Telephone Encounter (Signed)
 Called the patient and left a voice message asking to give me a call back at the office

## 2024-10-18 NOTE — Telephone Encounter (Signed)
 Copied from CRM [id number]. Topic: Clinical - Lab/Test Results >> Oct 18, 2024  9:10 AM Revonda D wrote: Reason for CRM: Pt is returning a missed call from the office. I informed the pt that Joeseph called in regards to her lab results (critical) and will give her a callback once available.

## 2024-10-18 NOTE — Telephone Encounter (Signed)
 The patient has been notified of the result and verbalized understanding. Patient asked if the Lisinopril  is the medication that makes people swell if so  I am on hte fence about taking that. Informed that I will pass this along to [facility] and get back with her. She thanked me for calling and I informed her that the same information was sent to her via MyChart.

## 2024-10-25 ENCOUNTER — Encounter: Payer: Self-pay | Admitting: Professional

## 2024-10-25 ENCOUNTER — Ambulatory Visit: Admitting: Professional

## 2024-10-25 DIAGNOSIS — F331 Major depressive disorder, recurrent, moderate: Secondary | ICD-10-CM

## 2024-10-25 DIAGNOSIS — F411 Generalized anxiety disorder: Secondary | ICD-10-CM

## 2024-10-25 DIAGNOSIS — F4310 Post-traumatic stress disorder, unspecified: Secondary | ICD-10-CM
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
## 2024-10-25 NOTE — Progress Notes (Deleted)
   Nathanel Collet, [facility]

## 2024-10-25 NOTE — Progress Notes (Signed)
 [facility] Counselor/Therapist Progress Note  Patient ID: Jessica Greene, MRN: [id number],    Date: 10/25/2024  Time Spent: 65 minutes 9-1005am   Treatment Type: Individual Therapy  Risk Assessment: Danger to Self:  No Self-injurious Behavior: No Danger to Others: No  Subjective: This session was held via video teletherapy. The patient consented to video teletherapy and was located in her home for this session. She is aware it is the responsibility of the patient to secure confidentiality on her end of the session. The provider was in a private home office for the duration of this session.    The patient arrived on time for her Caregility appointment.   1-completed development of treatment plan -pt fully participated and was provided a copy 2-pt questioning ADHD, and bipolar diagnosis   Diagnosis:Major depressive disorder, recurrent episode, moderate (HCC)  GAD (generalized anxiety disorder)  PTSD (post-traumatic stress disorder)  Plan:  -meet again on Wednesday, November 01, 2024 at [email or web address].

## 2024-11-01 ENCOUNTER — Encounter: Payer: Self-pay | Admitting: Professional

## 2024-11-01 ENCOUNTER — Ambulatory Visit: Admitting: Professional

## 2024-11-01 DIAGNOSIS — F411 Generalized anxiety disorder: Secondary | ICD-10-CM | POA: Diagnosis not present

## 2024-11-01 DIAGNOSIS — F4310 Post-traumatic stress disorder, unspecified: Secondary | ICD-10-CM | POA: Diagnosis not present

## 2024-11-01 DIAGNOSIS — F331 Major depressive disorder, recurrent, moderate: Secondary | ICD-10-CM | POA: Diagnosis not present

## 2024-11-01 NOTE — Progress Notes (Signed)
 [facility] Counselor/Therapist Progress Note  Patient ID: Jessica Greene, MRN: [id number],    Date: 11/01/2024  Time Spent: 63 minutes 903-1006am   Treatment Type: Individual Therapy  Risk Assessment: Danger to Self:  No Self-injurious Behavior: No Danger to Others: No  Subjective: This session was held via video teletherapy. The patient consented to video teletherapy and was located in her home for this session. She is aware it is the responsibility of the patient to secure confidentiality on her end of the session. The provider was in a private home office for the duration of this session.    The patient arrived on time for her Caregility appointment.   1-met with husband and patient -Pt's husband says she worries more and takes care of them too much and give entirely and she doesn't take care of herself -He is comfortable with her engaging in self-care -Never once has she walked even though it's only 15 minutes -She's moody -husband tends to gets quiet when going through stuff and he'll have an attitude, and it causes her to have a wife -Jessica Greene reports when he's wrong he has a bad memory and doesn't remember things all around She doesn't remember when she does the wrong thing to her spouse She doesn't listen Her job can tell her that's she doing something wrong, she'll take it -edibles to sleep due to insomnia 2-anger -educated on anger iceberg -pt able to identify that her sister is a trigger -trigger at work-thinks she was overwhelmed with everything they have her doing 3-employment -frustrated with people's lack of follow through -previous shift did not do what they needed to do and she had to other people's work -only a handful of people that care about their job   Diagnosis:GAD (generalized anxiety disorder)  Major depressive disorder, recurrent episode, moderate (HCC)  PTSD (post-traumatic stress disorder)  Plan:  -remove herself  temporarily to the freezer to deep breath and release feelings -meet again on Wednesday, November 08, 2024 at 9am.

## 2024-11-08 ENCOUNTER — Encounter: Payer: Self-pay | Admitting: Professional

## 2024-11-08 ENCOUNTER — Ambulatory Visit: Admitting: Professional

## 2024-11-08 DIAGNOSIS — F411 Generalized anxiety disorder: Secondary | ICD-10-CM

## 2024-11-08 DIAGNOSIS — F331 Major depressive disorder, recurrent, moderate: Secondary | ICD-10-CM

## 2024-11-08 DIAGNOSIS — F4310 Post-traumatic stress disorder, unspecified: Secondary | ICD-10-CM | POA: Diagnosis not present
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
## 2024-11-15 ENCOUNTER — Ambulatory Visit
Admission: RE | Admit: 2024-11-15 | Discharge: 2024-11-15 | Disposition: A | Discharge status: Home / Self Care | Source: Ambulatory Visit | Attending: Family Medicine | Admitting: Family Medicine

## 2024-11-15 DIAGNOSIS — B9689 Other specified bacterial agents as the cause of diseases classified elsewhere: Secondary | ICD-10-CM | POA: Diagnosis not present

## 2024-11-15 DIAGNOSIS — N760 Acute vaginitis: Secondary | ICD-10-CM | POA: Insufficient documentation

## 2024-11-15 DIAGNOSIS — Z113 Encounter for screening for infections with a predominantly sexual mode of transmission: Secondary | ICD-10-CM | POA: Diagnosis not present

## 2024-11-15 MED ORDER — FLUCONAZOLE 150 MG PO TABS: 150.0000 mg | ORAL_TABLET | ORAL | 0 refills | Status: AC

## 2024-11-15 MED ORDER — METRONIDAZOLE 500 MG PO TABS: 500.0000 mg | ORAL_TABLET | Freq: Two times a day (BID) | ORAL | 0 refills | Status: AC

## 2024-11-15 NOTE — ED Provider Notes (Signed)
 " Producer, Television/film/video - [facility]  Note:  This document was prepared using Conservation officer, historic buildings and may include unintentional dictation errors.  MRN: [id number] DOB: [birth year]  Subjective:   Jessica Greene is a 38 y.o. female presenting for 2-3 week history of vaginal discharge, vaginal irritation.  Has a history of yeast and BV infection.  Would like complete STI testing and is requesting empiric treatment.  Denies fever, n/v, abdominal pain, pelvic pain, rashes, dysuria, urinary frequency, hematuria.    Current Outpatient Medications  Medication Instructions   FLUoxetine  (PROZAC ) 20 mg, Oral, Daily   fluticasone  (FLONASE ) 50 MCG/ACT nasal spray 2 sprays, Each Nare, Daily   Iron  (Ferrous Sulfate ) 325 mg, Oral, Every other day   lisinopril  (ZESTRIL ) 10 mg, Oral, Daily   omeprazole  (PRILOSEC) 20 mg, Oral, Every morning   potassium chloride  SA (KLOR-CON  M) 20 MEQ tablet 20 mEq, Oral, 2 times daily   pravastatin  (PRAVACHOL ) 20 mg, Oral, Daily at bedtime   Slynd  4 mg, Oral, Daily   triamcinolone  cream (KENALOG ) 0.1 % 1 Application, Topical, 2 times daily   Vitamin D  (Ergocalciferol ) (DRISDOL ) 50,000 Units, Oral, Every 7 days    Allergies[1]  Past Medical History:  Diagnosis Date   Diabetes mellitus without complication (HCC)    GERD (gastroesophageal reflux disease)    Hernia of abdominal cavity    High cholesterol    Hypertension    Polycystic ovarian syndrome    Uterine polyp 2017     Past Surgical History:  Procedure Laterality Date   ESOPHAGOGASTRODUODENOSCOPY ENDOSCOPY  2013   POLYPECTOMY  06/29/2016   s/p hysteroscopic polypectomy     Family History  Problem Relation Age of Onset   Diabetes Mother    Hypertension Mother    Hyperlipidemia Mother    Heart disease Mother    Hypertrophic cardiomyopathy Mother    Hypertension Father    Diabetes Father    Hyperlipidemia Father    Heart disease Father    Stroke Father    Cancer Father         pancreatic, throat, Breast   Clotting disorder Father    Hypertrophic cardiomyopathy Sister    Clotting disorder Sister    Heart disease Sister    Depression Sister    Bipolar disorder Sister    Hypertrophic cardiomyopathy Maternal Aunt    Heart disease Maternal Aunt    Hypertrophic cardiomyopathy Maternal Uncle    Heart disease Maternal Uncle    Heart disease Paternal Aunt    Heart disease Paternal Uncle    Hypertrophic cardiomyopathy Maternal Grandmother    Heart disease Maternal Grandmother    Heart disease Maternal Grandfather    Heart disease Paternal Grandmother    Heart disease Paternal Grandfather    Birth defects Paternal Grandfather     Social History   Occupational History   Not on file  Tobacco Use   Smoking status: Former    Types: Cigarettes   Smokeless tobacco: Never  Vaping Use   Vaping status: Never Used  Substance and Sexual Activity   Alcohol use: Yes    Comment: occasionally   Drug use: Not Currently    Types: Marijuana   Sexual activity: Yes    Partners: Male    Birth control/protection: Pill     ROS   Objective:   Vitals: BP (P) 124/85 (BP Location: Right Arm)   Pulse (P) 83   Temp (P) 99.2 F (37.3 C) (Oral)   Resp (P) 20  LMP 11/07/2024   SpO2 (P) 96%   Physical Exam Constitutional:      General: She is not in acute distress.    Appearance: Normal appearance. She is well-developed. She is not ill-appearing, toxic-appearing or diaphoretic.  HENT:     Head: Normocephalic and atraumatic.     Nose: Nose normal.     Mouth/Throat:     Mouth: Mucous membranes are moist.  Eyes:     General: No scleral icterus.       Right eye: No discharge.        Left eye: No discharge.     Extraocular Movements: Extraocular movements intact.  Cardiovascular:     Rate and Rhythm: Normal rate.  Pulmonary:     Effort: Pulmonary effort is normal.  Skin:    General: Skin is warm and dry.  Neurological:     General: No focal deficit present.      Mental Status: She is alert and oriented to person, place, and time.  Psychiatric:        Mood and Affect: Mood normal.        Behavior: Behavior normal.    Assessment and Plan :   PDMP not reviewed this encounter.  1. Bacterial vaginosis   2. Acute vaginitis   3. Screening examination for STI      We will treat patient empirically for bacterial vaginosis with Flagyl  and for yeast vaginitis with fluconazole .  Labs pending. Counseled patient on potential for adverse effects with medications prescribed/recommended today, ER and return-to-clinic precautions discussed, patient verbalized understanding.     [1] No Known Allergies    Christopher Savannah, [place] 11/15/24 [id number]  "

## 2024-11-15 NOTE — ED Triage Notes (Signed)
 Pt c/o vaginal x 2-3 weeks-states she feels may be reoccurring BV NAD-steady gait

## 2024-11-15 NOTE — Discharge Instructions (Addendum)
 I have sent prescriptions for metronidazole  to treat for suspected recurrent BV infection as well as fluconazole  to treat for recurrent yeast infection.  Our results team will update you on your lab tests as soon as we have them available.

## 2024-11-16 LAB — SYPHILIS: RPR W/REFLEX TO RPR TITER AND TREPONEMAL ANTIBODIES, TRADITIONAL SCREENING AND DIAGNOSIS ALGORITHM: RPR Ser Ql: NONREACTIVE

## 2024-11-16 LAB — HIV ANTIBODY (ROUTINE TESTING W REFLEX): HIV Screen 4th Generation wRfx: NONREACTIVE

## 2024-11-17 ENCOUNTER — Ambulatory Visit (HOSPITAL_COMMUNITY): Payer: Self-pay

## 2024-11-17 LAB — CERVICOVAGINAL ANCILLARY ONLY
Bacterial Vaginitis (gardnerella): NEGATIVE
Candida Glabrata: POSITIVE — AB
Candida Vaginitis: NEGATIVE
Chlamydia: NEGATIVE
Comment: NEGATIVE
Comment: NEGATIVE
Comment: NEGATIVE
Comment: NEGATIVE
Comment: NEGATIVE
Comment: NORMAL
Neisseria Gonorrhea: NEGATIVE
Trichomonas: NEGATIVE

## 2024-11-28 ENCOUNTER — Encounter: Payer: Self-pay | Admitting: Professional

## 2024-11-28 ENCOUNTER — Ambulatory Visit: Admitting: Professional

## 2024-11-28 DIAGNOSIS — F4310 Post-traumatic stress disorder, unspecified: Secondary | ICD-10-CM

## 2024-11-28 DIAGNOSIS — F411 Generalized anxiety disorder: Secondary | ICD-10-CM

## 2024-11-28 DIAGNOSIS — F331 Major depressive disorder, recurrent, moderate: Secondary | ICD-10-CM
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
## 2024-12-04 ENCOUNTER — Encounter: Payer: Self-pay | Admitting: Nurse Practitioner

## 2024-12-06 ENCOUNTER — Ambulatory Visit: Admitting: Professional

## 2024-12-06 ENCOUNTER — Encounter: Payer: Self-pay | Admitting: Professional

## 2024-12-06 DIAGNOSIS — F331 Major depressive disorder, recurrent, moderate: Secondary | ICD-10-CM | POA: Diagnosis not present

## 2024-12-06 DIAGNOSIS — F4310 Post-traumatic stress disorder, unspecified: Secondary | ICD-10-CM

## 2024-12-06 DIAGNOSIS — F411 Generalized anxiety disorder: Secondary | ICD-10-CM
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
## 2024-12-14 ENCOUNTER — Ambulatory Visit: Admitting: Nurse Practitioner

## 2024-12-19 ENCOUNTER — Other Ambulatory Visit: Payer: Self-pay | Admitting: Nurse Practitioner

## 2024-12-19 DIAGNOSIS — L2082 Flexural eczema: Secondary | ICD-10-CM

## 2024-12-20 ENCOUNTER — Ambulatory Visit: Admitting: Professional

## 2024-12-20 ENCOUNTER — Encounter: Payer: Self-pay | Admitting: Professional

## 2024-12-20 DIAGNOSIS — F331 Major depressive disorder, recurrent, moderate: Secondary | ICD-10-CM

## 2024-12-20 DIAGNOSIS — F4310 Post-traumatic stress disorder, unspecified: Secondary | ICD-10-CM

## 2024-12-20 DIAGNOSIS — F411 Generalized anxiety disorder: Secondary | ICD-10-CM
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
## 2024-12-26 ENCOUNTER — Ambulatory Visit: Payer: Self-pay

## 2024-12-26 ENCOUNTER — Telehealth: Payer: Self-pay | Admitting: Nurse Practitioner

## 2024-12-26 NOTE — Telephone Encounter (Signed)
 FYI: This call has been transferred to triage nurse: the Triage Nurse. Once the result note has been entered staff can address the message at that time.  Patient called in with the following symptoms:  Red Word:numbness in R hand, she scheduled an appt with Roselie for 2/13 via mychart. I tried to move up appt, she is going out of town, so I transferred her over to NT.    Please advise at [facility] [phone]  Message is routed to Provider Pool.

## 2024-12-26 NOTE — Telephone Encounter (Signed)
 FYI Only or Action Required?: FYI only for provider: UC advised, unavailable during office hours.  Patient was last seen in primary care on 10/16/2024 by Nche, Roselie Rockford, NP.  Called Nurse Triage reporting Hand Problem and Vaginitis.  Symptoms began several weeks ago.  Interventions attempted: Nothing.  Symptoms are: gradually worsening.  Triage Disposition: See PCP When Office is Open (Within 3 Days)  Patient/caregiver understands and will follow disposition?: Unsure   Reason for Disposition  [1] Weakness or numbness in hand or fingers AND [2] present > 2 weeks  Answer Assessment - Initial Assessment Questions Pt reports c/o right hand weakness when attempting to hold an object x 2-3 weeks. 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   Also reports vaginal burning sensation x 3 weeks, denies burning with urination. Denies discharge.   Pt unavailable to come in this week d/t work schedule. Advised UC after work today. Pt states she will consider.   1. ONSET: When did the pain start?     2-3 weeks 2. LOCATION: Where is the pain located?     Right hand 3. PAIN: How bad is the pain? (Scale 1-10; or mild, moderate, severe)     Denies pain 4. WORK OR EXERCISE: Has there been any recent work or exercise that involved this part (i.e., hand or wrist) of the body?     See above 5. CAUSE: What do you think is causing the pain?     Unknown  Protocols used: Hand Pain-A-AH

## 2024-12-27 ENCOUNTER — Ambulatory Visit: Admitting: Professional

## 2024-12-27 ENCOUNTER — Encounter: Payer: Self-pay | Admitting: Professional

## 2024-12-27 DIAGNOSIS — F411 Generalized anxiety disorder: Secondary | ICD-10-CM

## 2024-12-27 DIAGNOSIS — F4310 Post-traumatic stress disorder, unspecified: Secondary | ICD-10-CM | POA: Diagnosis not present

## 2024-12-27 DIAGNOSIS — F331 Major depressive disorder, recurrent, moderate: Secondary | ICD-10-CM | POA: Diagnosis not present
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
## 2025-01-05 ENCOUNTER — Ambulatory Visit: Admitting: Nurse Practitioner

## 2025-01-15 ENCOUNTER — Ambulatory Visit: Admitting: Nurse Practitioner

## 2025-01-26 ENCOUNTER — Ambulatory Visit: Admitting: Psychology

## 2025-01-31 ENCOUNTER — Ambulatory Visit: Admitting: Professional

## 2025-02-08 ENCOUNTER — Ambulatory Visit: Admitting: Nurse Practitioner
